# Patient Record
Sex: Female | Born: 1975
Health system: Southern US, Community
[De-identification: ages and names within clinical notes are randomized; demographics above are authoritative.]

## PROBLEM LIST (undated history)

## (undated) DIAGNOSIS — IMO0001 Reserved for inherently not codable concepts without codable children: Secondary | ICD-10-CM

## (undated) DIAGNOSIS — F32A Depression, unspecified: Secondary | ICD-10-CM

## (undated) DIAGNOSIS — N200 Calculus of kidney: Secondary | ICD-10-CM

## (undated) DIAGNOSIS — F329 Major depressive disorder, single episode, unspecified: Secondary | ICD-10-CM

## (undated) DIAGNOSIS — R896 Abnormal cytological findings in specimens from other organs, systems and tissues: Secondary | ICD-10-CM

## (undated) DIAGNOSIS — G43909 Migraine, unspecified, not intractable, without status migrainosus: Secondary | ICD-10-CM

## (undated) HISTORY — DX: Migraine, unspecified, not intractable, without status migrainosus: G43.909

## (undated) HISTORY — DX: Calculus of kidney: N20.0

## (undated) HISTORY — DX: Abnormal cytological findings in specimens from other organs, systems and tissues: R89.6

## (undated) HISTORY — PX: REDUCTION MAMMAPLASTY: SUR839

## (undated) HISTORY — DX: Major depressive disorder, single episode, unspecified: F32.9

## (undated) HISTORY — PX: APPENDECTOMY: SHX54

## (undated) HISTORY — DX: Reserved for inherently not codable concepts without codable children: IMO0001

## (undated) HISTORY — DX: Depression, unspecified: F32.A

---
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
## 1998-08-14 ENCOUNTER — Other Ambulatory Visit: Admission: RE | Admit: 1998-08-14 | Discharge: 1998-08-14 | Payer: Self-pay | Admitting: Obstetrics and Gynecology

## 1998-11-11 ENCOUNTER — Emergency Department (HOSPITAL_COMMUNITY): Admission: EM | Admit: 1998-11-11 | Discharge: 1998-11-12 | Payer: Self-pay | Admitting: *Deleted

## 1999-08-04 ENCOUNTER — Emergency Department (HOSPITAL_COMMUNITY): Admission: EM | Admit: 1999-08-04 | Discharge: 1999-08-04 | Payer: Self-pay | Admitting: Internal Medicine

## 1999-08-25 ENCOUNTER — Other Ambulatory Visit: Admission: RE | Admit: 1999-08-25 | Discharge: 1999-08-25 | Payer: Self-pay | Admitting: Obstetrics and Gynecology

## 1999-10-14 ENCOUNTER — Encounter: Payer: Self-pay | Admitting: Emergency Medicine

## 1999-10-14 ENCOUNTER — Emergency Department (HOSPITAL_COMMUNITY): Admission: EM | Admit: 1999-10-14 | Discharge: 1999-10-14 | Payer: Self-pay | Admitting: Emergency Medicine

## 1999-11-02 ENCOUNTER — Encounter: Payer: Self-pay | Admitting: Urology

## 1999-11-02 ENCOUNTER — Encounter: Admission: RE | Admit: 1999-11-02 | Discharge: 1999-11-02 | Payer: Self-pay | Admitting: Urology

## 1999-12-09 ENCOUNTER — Encounter: Payer: Self-pay | Admitting: Urology

## 1999-12-09 ENCOUNTER — Encounter: Admission: RE | Admit: 1999-12-09 | Discharge: 1999-12-09 | Payer: Self-pay | Admitting: Urology

## 2000-03-21 ENCOUNTER — Emergency Department (HOSPITAL_COMMUNITY): Admission: EM | Admit: 2000-03-21 | Discharge: 2000-03-21 | Payer: Self-pay | Admitting: Emergency Medicine

## 2000-03-21 ENCOUNTER — Encounter: Payer: Self-pay | Admitting: Emergency Medicine

## 2000-05-04 ENCOUNTER — Ambulatory Visit (HOSPITAL_COMMUNITY): Admission: RE | Admit: 2000-05-04 | Discharge: 2000-05-04 | Payer: Self-pay | Admitting: Internal Medicine

## 2000-05-04 ENCOUNTER — Encounter: Payer: Self-pay | Admitting: Internal Medicine

## 2000-06-09 ENCOUNTER — Emergency Department (HOSPITAL_COMMUNITY): Admission: EM | Admit: 2000-06-09 | Discharge: 2000-06-09 | Payer: Self-pay | Admitting: *Deleted

## 2000-06-09 ENCOUNTER — Encounter: Payer: Self-pay | Admitting: *Deleted

## 2000-08-15 ENCOUNTER — Other Ambulatory Visit: Admission: RE | Admit: 2000-08-15 | Discharge: 2000-08-15 | Payer: Self-pay | Admitting: Obstetrics and Gynecology

## 2000-09-22 ENCOUNTER — Emergency Department (HOSPITAL_COMMUNITY): Admission: EM | Admit: 2000-09-22 | Discharge: 2000-09-22 | Payer: Self-pay | Admitting: Emergency Medicine

## 2000-11-08 ENCOUNTER — Encounter: Payer: Self-pay | Admitting: Emergency Medicine

## 2000-11-08 ENCOUNTER — Emergency Department (HOSPITAL_COMMUNITY): Admission: EM | Admit: 2000-11-08 | Discharge: 2000-11-08 | Payer: Self-pay | Admitting: Emergency Medicine

## 2000-11-09 ENCOUNTER — Emergency Department (HOSPITAL_COMMUNITY): Admission: EM | Admit: 2000-11-09 | Discharge: 2000-11-09 | Payer: Self-pay | Admitting: Emergency Medicine

## 2000-11-09 ENCOUNTER — Encounter: Payer: Self-pay | Admitting: *Deleted

## 2001-01-24 HISTORY — PX: CHOLECYSTECTOMY: SHX55

## 2001-02-06 ENCOUNTER — Emergency Department (HOSPITAL_COMMUNITY): Admission: EM | Admit: 2001-02-06 | Discharge: 2001-02-06 | Payer: Self-pay | Admitting: Emergency Medicine

## 2001-05-14 ENCOUNTER — Ambulatory Visit (HOSPITAL_COMMUNITY): Admission: RE | Admit: 2001-05-14 | Discharge: 2001-05-14 | Payer: Self-pay | Admitting: Internal Medicine

## 2001-05-14 ENCOUNTER — Encounter: Payer: Self-pay | Admitting: Internal Medicine

## 2001-05-22 ENCOUNTER — Encounter (INDEPENDENT_AMBULATORY_CARE_PROVIDER_SITE_OTHER): Payer: Self-pay | Admitting: Specialist

## 2001-05-22 ENCOUNTER — Ambulatory Visit (HOSPITAL_COMMUNITY): Admission: RE | Admit: 2001-05-22 | Discharge: 2001-05-23 | Payer: Self-pay | Admitting: *Deleted

## 2001-11-19 ENCOUNTER — Other Ambulatory Visit: Admission: RE | Admit: 2001-11-19 | Discharge: 2001-11-19 | Payer: Self-pay | Admitting: Obstetrics and Gynecology

## 2002-01-24 HISTORY — PX: BREAST SURGERY: SHX581

## 2003-01-10 ENCOUNTER — Inpatient Hospital Stay (HOSPITAL_COMMUNITY): Admission: EM | Admit: 2003-01-10 | Discharge: 2003-01-12 | Payer: Self-pay | Admitting: Emergency Medicine

## 2003-01-10 ENCOUNTER — Encounter (INDEPENDENT_AMBULATORY_CARE_PROVIDER_SITE_OTHER): Payer: Self-pay | Admitting: Specialist

## 2003-02-24 ENCOUNTER — Other Ambulatory Visit: Admission: RE | Admit: 2003-02-24 | Discharge: 2003-02-24 | Payer: Self-pay | Admitting: Obstetrics and Gynecology

## 2003-05-05 ENCOUNTER — Emergency Department (HOSPITAL_COMMUNITY): Admission: EM | Admit: 2003-05-05 | Discharge: 2003-05-05 | Payer: Self-pay | Admitting: Emergency Medicine

## 2004-01-10 ENCOUNTER — Emergency Department (HOSPITAL_COMMUNITY): Admission: EM | Admit: 2004-01-10 | Discharge: 2004-01-10 | Payer: Self-pay | Admitting: Family Medicine

## 2004-03-30 ENCOUNTER — Other Ambulatory Visit: Admission: RE | Admit: 2004-03-30 | Discharge: 2004-03-30 | Payer: Self-pay | Admitting: Obstetrics and Gynecology

## 2004-06-27 ENCOUNTER — Emergency Department (HOSPITAL_COMMUNITY): Admission: EM | Admit: 2004-06-27 | Discharge: 2004-06-27 | Payer: Self-pay | Admitting: Family Medicine

## 2004-06-27 ENCOUNTER — Ambulatory Visit (HOSPITAL_COMMUNITY): Admission: RE | Admit: 2004-06-27 | Discharge: 2004-06-27 | Payer: Self-pay | Admitting: Emergency Medicine

## 2004-06-28 ENCOUNTER — Emergency Department (HOSPITAL_COMMUNITY): Admission: EM | Admit: 2004-06-28 | Discharge: 2004-06-28 | Payer: Self-pay | Admitting: Emergency Medicine

## 2005-02-21 ENCOUNTER — Emergency Department (HOSPITAL_COMMUNITY): Admission: EM | Admit: 2005-02-21 | Discharge: 2005-02-22 | Payer: Self-pay | Admitting: Emergency Medicine

## 2005-02-21 ENCOUNTER — Ambulatory Visit (HOSPITAL_COMMUNITY): Admission: RE | Admit: 2005-02-21 | Discharge: 2005-02-21 | Payer: Self-pay | Admitting: Physician Assistant

## 2005-05-10 ENCOUNTER — Other Ambulatory Visit: Admission: RE | Admit: 2005-05-10 | Discharge: 2005-05-10 | Payer: Self-pay | Admitting: Obstetrics and Gynecology

## 2005-08-18 ENCOUNTER — Emergency Department (HOSPITAL_COMMUNITY): Admission: EM | Admit: 2005-08-18 | Discharge: 2005-08-18 | Payer: Self-pay | Admitting: Family Medicine

## 2005-08-23 ENCOUNTER — Emergency Department (HOSPITAL_COMMUNITY): Admission: EM | Admit: 2005-08-23 | Discharge: 2005-08-23 | Payer: Self-pay | Admitting: Emergency Medicine

## 2005-12-11 ENCOUNTER — Emergency Department (HOSPITAL_COMMUNITY): Admission: EM | Admit: 2005-12-11 | Discharge: 2005-12-11 | Payer: Self-pay | Admitting: Emergency Medicine

## 2006-05-15 ENCOUNTER — Other Ambulatory Visit: Admission: RE | Admit: 2006-05-15 | Discharge: 2006-05-15 | Payer: Self-pay | Admitting: Obstetrics and Gynecology

## 2006-09-18 ENCOUNTER — Other Ambulatory Visit: Admission: RE | Admit: 2006-09-18 | Discharge: 2006-09-18 | Payer: Self-pay | Admitting: Obstetrics and Gynecology

## 2007-02-15 ENCOUNTER — Encounter
Admission: RE | Admit: 2007-02-15 | Discharge: 2007-05-16 | Payer: Self-pay | Admitting: Physical Medicine & Rehabilitation

## 2007-02-15 ENCOUNTER — Ambulatory Visit: Payer: Self-pay | Admitting: Physical Medicine & Rehabilitation

## 2007-02-22 ENCOUNTER — Emergency Department (HOSPITAL_COMMUNITY): Admission: EM | Admit: 2007-02-22 | Discharge: 2007-02-22 | Payer: Self-pay | Admitting: Emergency Medicine

## 2007-03-19 ENCOUNTER — Ambulatory Visit: Payer: Self-pay | Admitting: Physical Medicine & Rehabilitation

## 2007-03-27 ENCOUNTER — Encounter
Admission: RE | Admit: 2007-03-27 | Discharge: 2007-04-03 | Payer: Self-pay | Admitting: Physical Medicine & Rehabilitation

## 2007-03-27 ENCOUNTER — Ambulatory Visit: Payer: Self-pay | Admitting: Physical Medicine & Rehabilitation

## 2007-04-03 ENCOUNTER — Ambulatory Visit: Payer: Self-pay | Admitting: Physical Medicine & Rehabilitation

## 2007-05-22 ENCOUNTER — Other Ambulatory Visit: Admission: RE | Admit: 2007-05-22 | Discharge: 2007-05-22 | Payer: Self-pay | Admitting: Obstetrics and Gynecology

## 2007-06-22 ENCOUNTER — Emergency Department (HOSPITAL_COMMUNITY): Admission: EM | Admit: 2007-06-22 | Discharge: 2007-06-22 | Payer: Self-pay | Admitting: Emergency Medicine

## 2007-10-04 ENCOUNTER — Emergency Department (HOSPITAL_COMMUNITY): Admission: EM | Admit: 2007-10-04 | Discharge: 2007-10-04 | Payer: Self-pay | Admitting: Family Medicine

## 2007-11-16 ENCOUNTER — Emergency Department (HOSPITAL_COMMUNITY): Admission: EM | Admit: 2007-11-16 | Discharge: 2007-11-16 | Payer: Self-pay | Admitting: Emergency Medicine

## 2008-01-01 ENCOUNTER — Ambulatory Visit (HOSPITAL_COMMUNITY): Admission: RE | Admit: 2008-01-01 | Discharge: 2008-01-01 | Payer: Self-pay | Admitting: Sports Medicine

## 2008-02-07 ENCOUNTER — Ambulatory Visit: Payer: Self-pay | Admitting: Gynecology

## 2008-02-20 ENCOUNTER — Encounter: Admission: RE | Admit: 2008-02-20 | Discharge: 2008-02-20 | Payer: Self-pay | Admitting: Orthopedic Surgery

## 2008-02-21 ENCOUNTER — Ambulatory Visit: Payer: Self-pay | Admitting: Obstetrics and Gynecology

## 2008-02-28 ENCOUNTER — Encounter: Admission: RE | Admit: 2008-02-28 | Discharge: 2008-02-28 | Payer: Self-pay | Admitting: Obstetrics and Gynecology

## 2008-05-28 ENCOUNTER — Ambulatory Visit: Payer: Self-pay | Admitting: Obstetrics and Gynecology

## 2008-05-28 ENCOUNTER — Encounter: Payer: Self-pay | Admitting: Obstetrics and Gynecology

## 2008-05-28 ENCOUNTER — Other Ambulatory Visit: Admission: RE | Admit: 2008-05-28 | Discharge: 2008-05-28 | Payer: Self-pay | Admitting: Obstetrics and Gynecology

## 2009-06-10 ENCOUNTER — Other Ambulatory Visit: Admission: RE | Admit: 2009-06-10 | Discharge: 2009-06-10 | Payer: Self-pay | Admitting: Obstetrics and Gynecology

## 2009-06-10 ENCOUNTER — Ambulatory Visit: Payer: Self-pay | Admitting: Obstetrics and Gynecology

## 2009-09-07 IMAGING — MG MM DIAGNOSTIC BILATERAL
5 series · 5 of 5 positions shown · non-contrast
Comparison: None

CLINICAL DATA: The patient has felt a small lump in the 3 o'clock
position of the left breast for 5 weeks.  The patient underwent
reduction mammoplasty 5 years ago.

DIGITAL DIAGNOSTIC  BILATERAL  MAMMOGRAM  WITH CAD AND LEFT BREAST
ULTRASOUND:

[R CC]
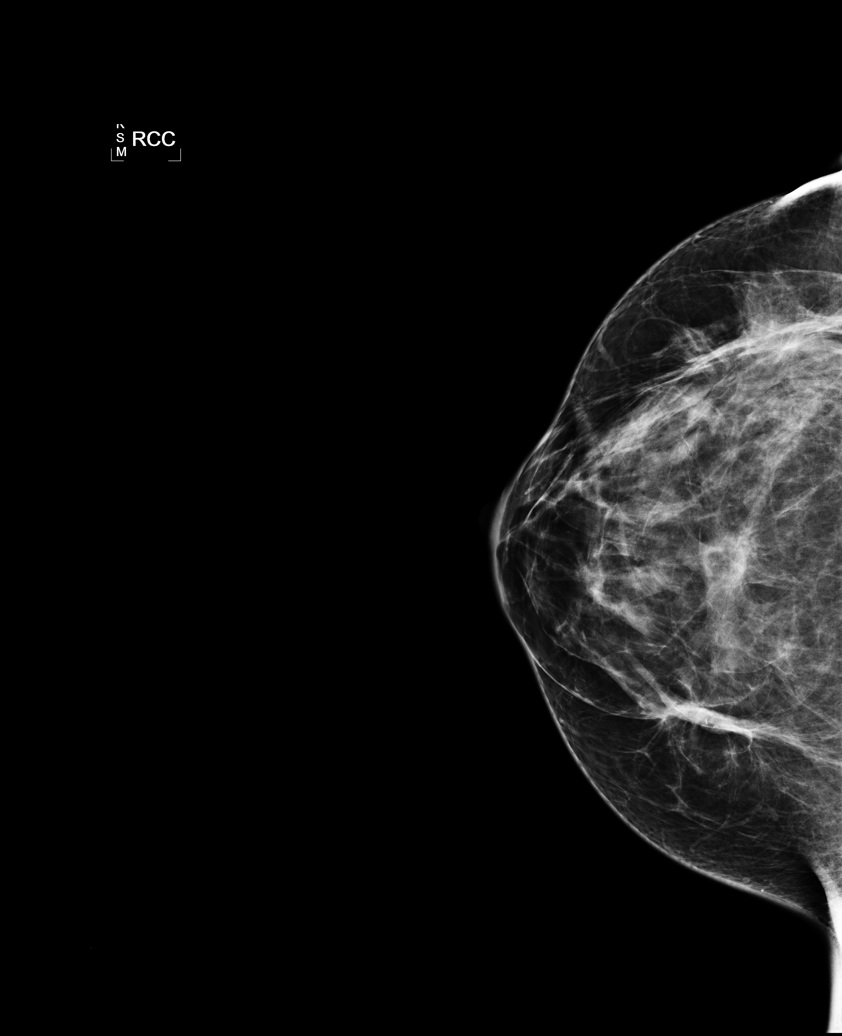

[L CC (1 of 2)]
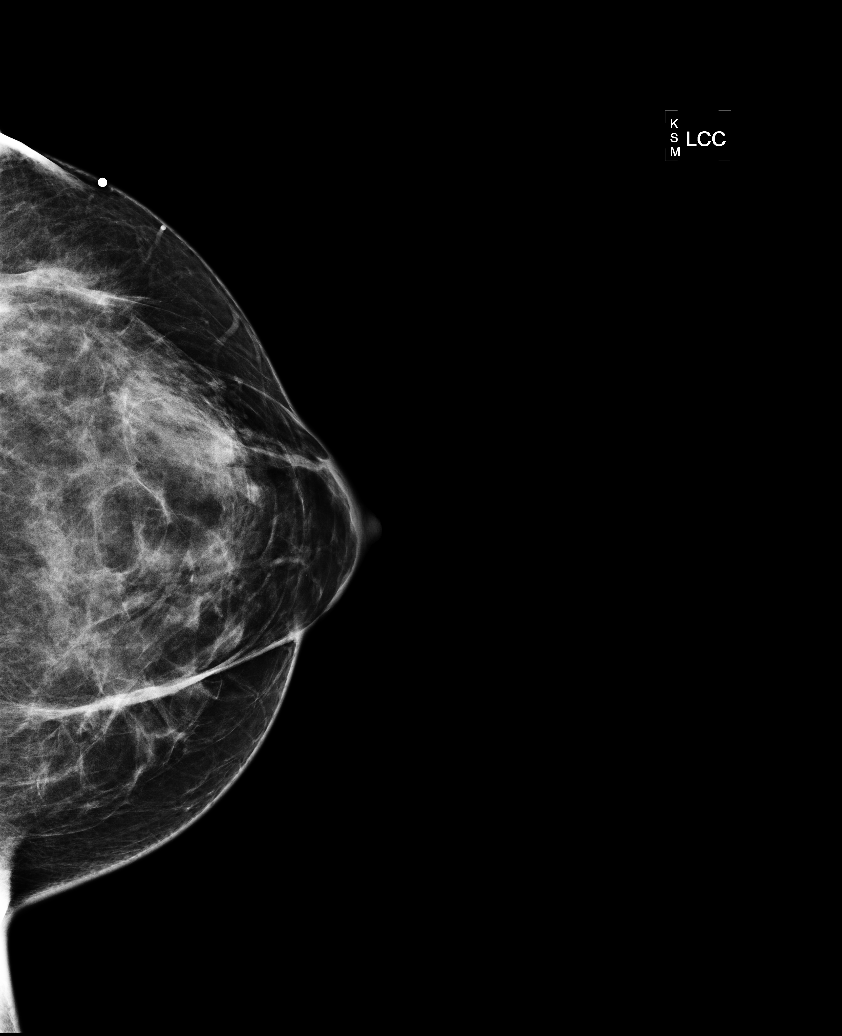

[L MLO]
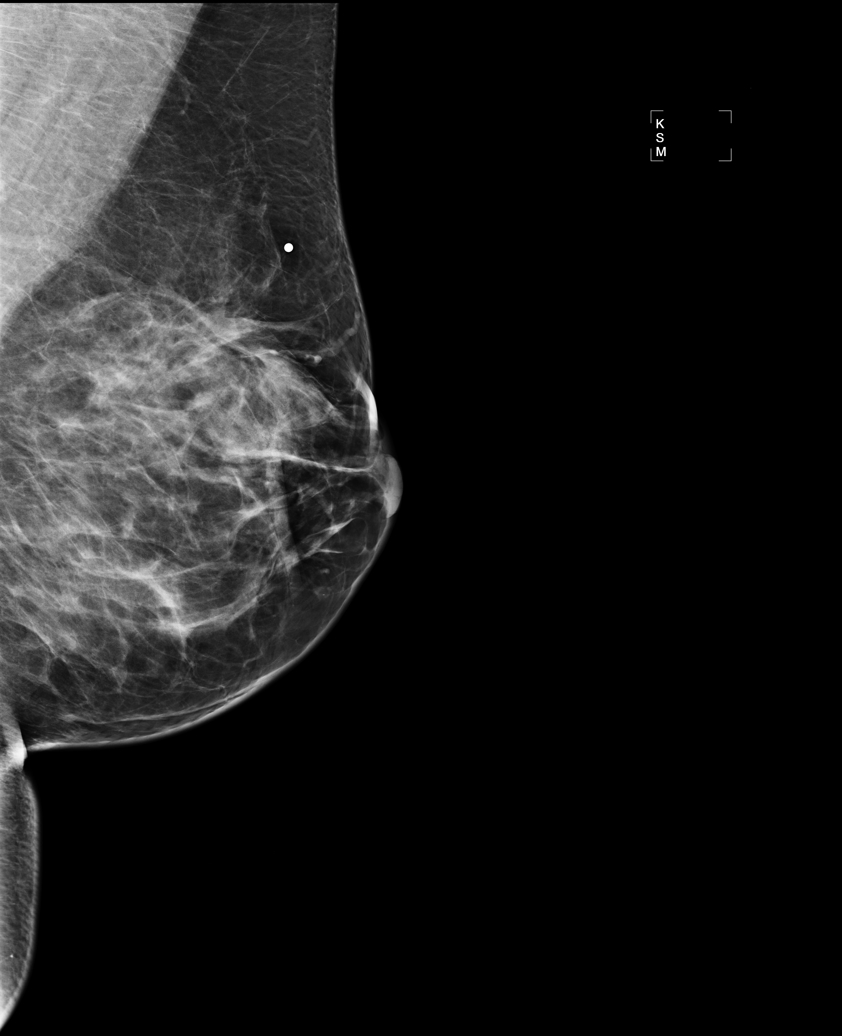

[R MLO]
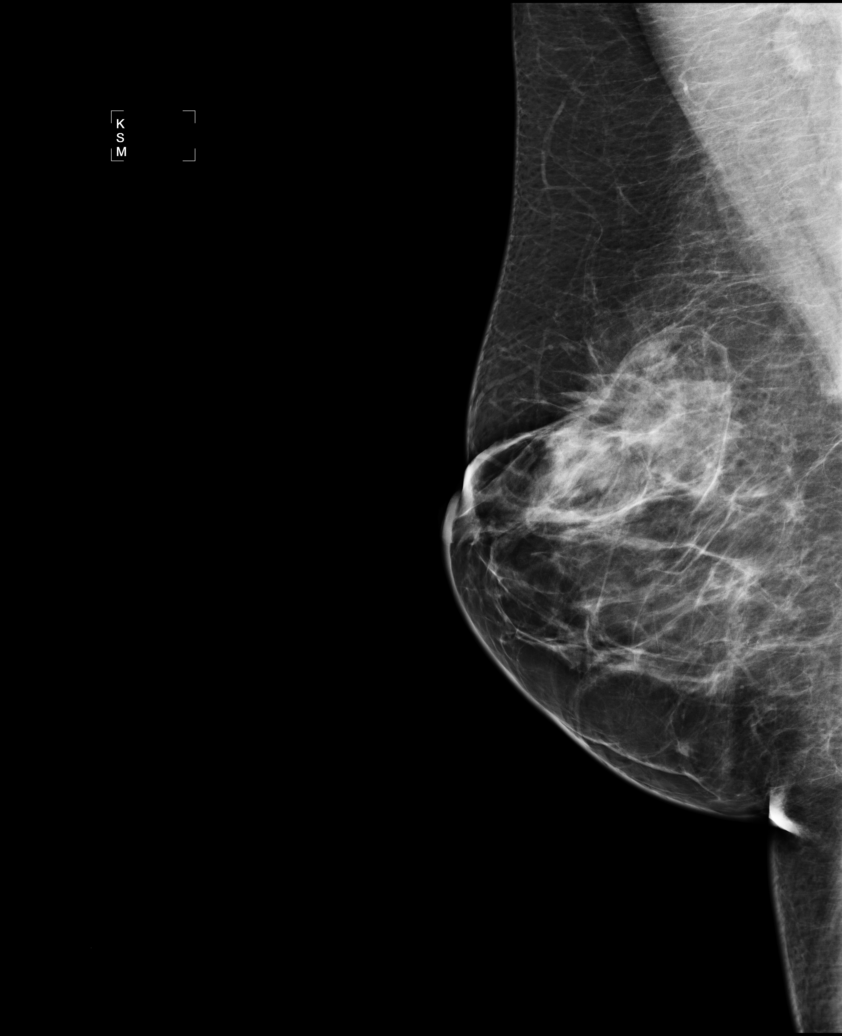

[L CC (2 of 2)]
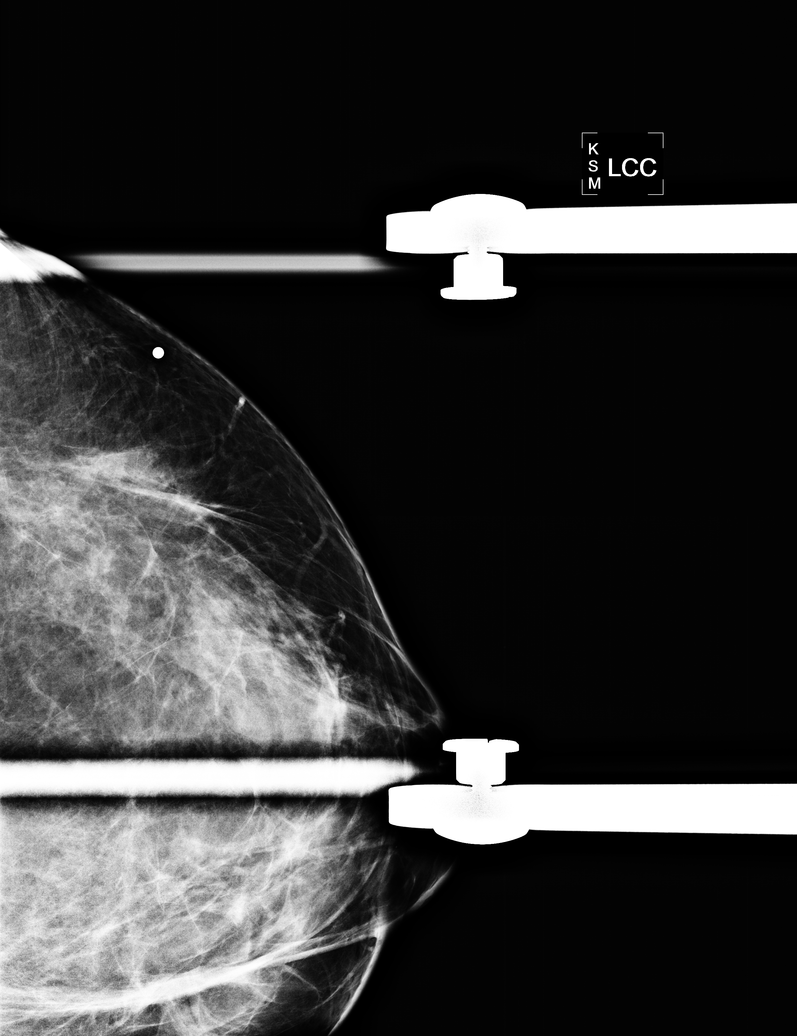

[5 of 5 positions shown; findings below may reference images not displayed]

FINDINGS: There are scattered fibroglandular densities.  Reduction
mammoplasty changes are noted bilaterally.  There is no dominant
mass, nonsurgical architectural distortion or calcification to
suggest malignancy.

On physical exam, no mass is palpated in the outer portion of the
left breast.

Ultrasound is performed, showing fatty tissue with no mass,
distortion or shadowing to suggest malignancy.
IMPRESSION: No mammographic or sonographic evidence of malignancy.  Yearly
screening mammography should begin at age 40 unless clinically
indicated earlier.

BI-RADS CATEGORY 1:  Negative.

REF:B1 DICTATED: 02/28/2008 [DATE]

## 2009-12-07 ENCOUNTER — Other Ambulatory Visit: Admission: RE | Admit: 2009-12-07 | Discharge: 2009-12-07 | Payer: Self-pay | Admitting: Obstetrics and Gynecology

## 2009-12-07 ENCOUNTER — Ambulatory Visit: Payer: Self-pay | Admitting: Obstetrics and Gynecology

## 2010-06-08 NOTE — Procedures (Signed)
NAMEIVIONNA, [name]            ACCOUNT NO.:  [id number]   MEDICAL RECORD NO.:  [id number]          PATIENT TYPE:  REC   LOCATION:  [facility]                          FACILITY:  [facility]   PHYSICIAN:  Erick Colace, M.D.DATE OF BIRTH:  07-Sep-1975   DATE OF PROCEDURE:  03/14/2007  DATE OF DISCHARGE:                               OPERATIVE REPORT   ACUPUNCTURE TREATMENT:  Last treatment performed March 06, 2007.   Needles placed bilaterally at LV3, LI4,  ST36,  E-Stim between LI4 and  ST36.  Treatment time 30 minutes.  The patient tolerated the procedure  well.  Consider addition of DU20 as well as some additional cervical  points.  I will see her back next week.  Will consider auricular points  as well.      Erick Colace, M.D.  Electronically Signed     AEK/MEDQ  D:  03/14/2007 12:24:33  T:  03/15/2007 08:10:26  Job:  [id number]

## 2010-06-08 NOTE — Procedures (Signed)
NAMEBREINDY, [name]            ACCOUNT NO.:  [id number]   MEDICAL RECORD NO.:  [id number]          PATIENT TYPE:  REC   LOCATION:  [facility]                          FACILITY:  [facility]   PHYSICIAN:  Erick Colace, M.D.DATE OF BIRTH:  1975/10/29   DATE OF PROCEDURE:  04/03/2007  DATE OF DISCHARGE:                               OPERATIVE REPORT   PROCEDURE:  Acupuncture treatment #5.   INDICATIONS FOR PROCEDURE:  Migraine headache with only partial response  to the medication and other conservative care.   DESCRIPTION OF PROCEDURE:  Needles placed bilateral at LV-3, LI-4, ST-  36, DU-20, right Shen-Men and right thalamus points, 4 Hz stimulation  between LI-4 and ST-36.  30-minute treatment time.   She may continue acupuncture on her own given the particulars of her  insurance policy, it may be better to pursue elsewhere.      Erick Colace, M.D.  Electronically Signed     AEK/MEDQ  D:  04/03/2007 15:15:37  T:  04/04/2007 09:44:59  Job:  [id number]

## 2010-06-08 NOTE — Group Therapy Note (Signed)
Consult requested by Dr. Meryl Crutch and [facility].   HISTORY OF PRESENT ILLNESS:  A 35 year old female with history of  migraine headaches dating back about 13 years.  She states that the pain  is in the facial region but also in the occipital region.  She has had  partial LEEP with triptans.  She has not had much relief in terms of  prophylactic agents.  She is currently on Cymbalta partially as a  prophylactic agent 60 mg a day.  Has tried Botox and this has not given  her any significant prophylactic effect.   CURRENT MEDICATIONS:  1. Imitrex as noted.  2. Cymbalta.  3. Ambien for sleep.  4. Ortho-Cyclen.   ALLERGIES:  Compazine causing agitation and Imipramine causing  agitation.   PAST MEDICAL HISTORY:  1. She has had a lap coli in 2006 and a laparoscopic appendectomy in      2007.  She has had imaging studies showing ovarian cyst as well as      nephrolithiasis without hydronephrosis.  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  She has had pain related to      ovarian cyst in 2007.  2. Functionally she is independent.  Works as an Scientist, forensic full time.      She has 3 migraines week but these are not disabling type      migraines.  3. Her pain when she gets her migraines is a 5-7/10 interferes      moderately with her usual activities.  Her worse headaches occur      about 4 a.m.  Her pain improves with ice as well as her      medications.   REVIEW OF SYSTEMS:  Positive for depression.   SOCIAL HISTORY:  Single.  Lives with her fiance. Nonsmoker.  Nondrinker.   FAMILY HISTORY:  High blood pressure.  Psychiatric problems.   Her preference is flavor sweet, season fall and color blue.   PHYSICAL EXAMINATION:  VITALS:  Her blood pressure is 134/84, pulse 66,  respirations 18, O2 sat 100% on room air.  GENERAL:  No acute distress.  Mood and affect is appropriate.  NECK: Has full range of motion. Cranial nerves II-XII are intact.  She  has  no tenderness to palpation over sinuses or over occipital or neck  area.  No pain in the upper traps but is a bit tight in the upper traps.  EXTREMITIES:  She has 5/5 strength bilateral deltoid, biceps, triceps,  grip as well as hip flexor, knee extensor and ankle dorsiflexor.  Her  gait is normal.  No evidence of toe drag or knee instability. Her  sensation is normal C6, 7, 8, T1 as well as L3, 4, 5, S1 dermatomes.  Deep tendon reflexes normal bilateral biceps, triceps, brachial  radialis, patellar and Achilles.  Range of motion is normal in the upper  and lower extremities.  Fine range of motion in the lumbar area and  thoracic area normal, neck area normal.  Coordination is normal.  Extremities without edema.   IMPRESSION:  Migraine headaches.  No neurological signs, no sign of  cervicogenic headache.   PLAN:  Discussed acupuncture as a treatment option and explained the  need for a weekly trial x 4-6 treatments.  Will do an abbreviated  treatment today.  Should she have good response, may need to do monthly  treatments as prophylactic against migraines.  Discussed with the patient, she agrees with the treatment plan.   ADDENDUM:  Needle placed bilateral LV 3, LI 4, and ST 36, E-stim between  LF 4, ST 36, treatment time 20 minutes for her stem.  The patient  tolerated procedure well.  She will be back in one week for repeat.      Erick Colace, M.D.  Electronically Signed     AEK/MedQ  D:  02/16/2007 17:11:12  T:  02/17/2007 07:36:04  Job #:  [id number]

## 2010-06-08 NOTE — Assessment & Plan Note (Signed)
Acupuncture treatment #1.  She was seen by me in consultation February 16, 2007.  Needles were placed bilaterally at LV as in the liver, 3LI as  in large intestine, 4ST as in stomach, 36.  E-stimulation between LI4  and ST6.  Treatment time 30 minutes.  The patient tolerated the  procedure well.  Post procedure instructions were given.      Erick Colace, M.D.  Electronically Signed     AEK/MedQ  D:  03/07/2007 12:42:17  T:  03/08/2007 15:10:22  Job #:  [id number]

## 2010-06-08 NOTE — Procedures (Signed)
NAMENORALYN, [name]            ACCOUNT NO.:  [id number]   MEDICAL RECORD NO.:  [id number]           PATIENT TYPE:   LOCATION:                                 FACILITY:   PHYSICIAN:  Erick Colace, M.D.DATE OF BIRTH:  1975-10-09   DATE OF PROCEDURE:  DATE OF DISCHARGE:                               OPERATIVE REPORT   PROCEDURE:  Acupuncture treatment number 4.   INDICATION FOR PROCEDURE:  Migraine headaches, only partial response to  medication management and other conservative care.   Needles placed bilaterally at LB3, LI4, ST36, as well as DU20 and left  Shen-men and left thalamus points on the regular points.  Four Hz  electrical stimulation between LI4 and ST36.   The patient tolerated the procedure well.  Thirty minute treatment time.      Erick Colace, M.D.  Electronically Signed     AEK/MEDQ  D:  03/20/2007 15:46:17  T:  03/21/2007 09:57:47  Job:  [id number]

## 2010-06-11 NOTE — H&P (Signed)
NAME:  [name]                      ACCOUNT NO.:  [id number]   MEDICAL RECORD NO.:  [id number]                   PATIENT TYPE:  INP   LOCATION:  5735                                 FACILITY:  [facility]   PHYSICIAN:  Velora Heckler, M.D.                DATE OF BIRTH:  03-05-1975   DATE OF ADMISSION:  01/09/2003  DATE OF DISCHARGE:                                HISTORY & PHYSICAL   REFERRING PHYSICIAN:  Celene Kras, M.D., Emergency Department   REASON FOR ADMISSION:  Acute appendicitis.   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  The patient had a laparoscopic cholecystectomy  in May, 2003 by Dr. Danna Hefty.  Since that time, she has had  intermittent abdominal pain; however, on December 16, following an afternoon  meal, patient developed lower abdominal pain on the right side.  This  persisted and was associated with nausea and vomiting.  The pain became more  severe.  She presented to the emergency department for evaluation.   Laboratory studies were performed, which were within normal limits; however,  physical examination showed tenderness in the right lower quadrant, which  was worrisome.  The patient was prepped and taken to radiology for CT scan  of the abdomen and pelvis, which showed findings consistent with acute  appendicitis with a retrocecal appendix.  General Surgery was consulted, and  the patient was seen and prepared for the operating room.   PAST MEDICAL HISTORY:  1. Status post laparoscopic cholecystectomy in May, 2003.  2. Status post breast reduction in 2004 by Dr. Delia Chimes.  3. Status post ankle surgery in 1995 for recurrent sprains.   MEDICATIONS:  1. Paxil.  2. Imitrex for migraines.   ALLERGIES:  SULFA causing nausea and vomiting and rash with LATEX gloves.   FAMILY HISTORY:   Noncontributory.   SOCIAL HISTORY:  The patient is an Engineer, agricultural at [employer].  She is accompanied by a friend.  She does not smoke.  She does not  drink alcohol.   REVIEW OF SYSTEMS:  A 15-system review without significant other positive  except as noted above.   PHYSICAL EXAMINATION:  GENERAL:  A 35 year old well-developed and well-  nourished white female in mild discomfort on a stretcher in the emergency  department.  VITAL SIGNS:  Temperature 98.1, blood pressure 116/66, pulse 95,  respirations 16.  O2 saturation 98% on room air.  HEENT:  Normocephalic.  Sclerae are clear.  Conjunctivae are clear.  Pupils  are equal and reactive.  Dentition is good.  Voice is normal.  NECK:  Palpation of the neck shows a slight fullness in the thyroid bed but  without nodularity.  There is no anterior or posterior cervical adenopathy.  There are no supraclavicular masses.  LUNGS:  Clear to auscultation bilaterally.  CARDIAC:  Regular rate and rhythm without murmur.  ABDOMEN:  Soft.  There are a few bowel sounds present.  There are well-  healed surgical wounds, consistent with previous cholecystectomy.  There is  tenderness to palpation and percussion in the right lower extremity.  There  is voluntary guarding.  There is mild rebound tenderness in the right lower  quadrant.  EXTREMITIES:  Nontender without edema.  NEUROLOGIC:  The patient is alert and oriented without focal neurologic  deficit.   LABORATORY DATA:  White count 7.1, hemoglobin 13.0, platelet count 299,000.  Differential shows 64% neutrophils, 24% lymphocytes.  Chemistry profile is  within normal limits.  Liver function tests are within normal limits.  Lipase is normal.  Urinalysis is benign.   CT scan of the abdomen and pelvis shows an 8.5 mm retrocecal appendix,  worrisome for early acute appendicitis.   IMPRESSION:  Probable early acute appendicitis with likely retrocecal  appendix.   DISCUSSION:  I  discussed at length with Ms. Sexton the indications for  surgery.  I explained laparoscopic appendectomy, which she understands.  I  have discussed the possibility of conversion to an open procedure due to the  retrocecal appendix.  She has been administered a dose of intravenous  Unasyn.  We will take her immediately to the operating room for laparoscopic  appendectomy.                                                Velora Heckler, M.D.    TMG/MEDQ  D:  01/10/2003  T:  01/10/2003  Job:  [id number]

## 2010-06-11 NOTE — Op Note (Signed)
NAME:  [name], [name]                      ACCOUNT NO.:  [id number]   MEDICAL RECORD NO.:  [id number]                   PATIENT TYPE:  INP   LOCATION:  1833                                 FACILITY:  [facility]   PHYSICIAN:  Velora Heckler, M.D.                DATE OF BIRTH:  1975/06/23   DATE OF PROCEDURE:  01/10/2003  DATE OF DISCHARGE:                                 OPERATIVE REPORT   PREOPERATIVE DIAGNOSIS:  Acute appendicitis.   POSTOPERATIVE DIAGNOSIS:  Acute appendicitis.   PROCEDURE:  Laparoscopic appendectomy.   SURGEON:  Velora Heckler, M.D.   ANESTHESIA:  General.   ESTIMATED BLOOD LOSS:  Minimal.   PREPARATION:  Betadine.   COMPLICATIONS:  None.   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 Her physical examination was felt to be consistent with acute  appendicitis. Laboratory studies were normal. A CT scan of the abdomen and  pelvis showed findings consistent with acute appendicitis. The patient is  brought to the operating room for appendectomy.   DESCRIPTION OF PROCEDURE:  The procedure was done in operating room #17 at  the [facility]. [facility]. The patient was brought to the  operating room and placed in the supine position on the operating table.  Following administration of general anesthesia the patient was prepped and  draped in the usual strict, aseptic fashion.   After ascertaining that an adequate level of anesthesia had been obtained, a  supraumbical  incision was made in the midline with a ##15 blade. Dissection  was carried down through the subcutaneous tissues. The fascia was incised in  the midline. The peritoneal cavity was entered cautiously. A 0 Vicryl  pursestring suture was placed in the fascia. A Hasson cannula was introduced  under direct  vision and secured with the pursestring suture.  The abdomen was insufflated with CO2. The laparoscope was introduced  and  the abdomen explored.   The appendix is visible in the right lower quadrant. It is  not retrocecal.  The tip of the appendix appears injected, larger than normal caliber and  likely  early appendicitis. There is no sign of perforation or abscess.  There is no fluid within the abdomen. Inspection of the bowel appears  normal. The colon appears normal. The liver appears normal. There are no  adhesions to the anterior abdominal wall.   Operative ports are placed in the right upper quadrant and left lower  quadrant. The appendix is elevated. It is slightly  adhered to the terminal  ileum. This was carefully  separated using the harmonic scalpel to divide  peritoneal reflections. The appendix was quite long. It is dissected out up  to the level of the cecum. The base of the appendix is transected  with an  Endo GIA stapler using a vascular cartridge. The remaining appendiceal  mesentery is divided with a harmonic scalpel with good hemostasis noted.  The appendix was placed in an EndoCatch bag and withdrawn through the left  lower quadrant port without difficulty. It is submitted to pathology for  review.   The right lower quadrant is inspected. Good hemostasis is noted. Ports were  removed under direct vision. Good hemostasis is noted at the port sites.  Pneumoperitoneum is released. The Hasson cannula is removed and the 0 Vicryl  pursestring suture tied securely. All 3 wounds were anesthetized with local  anesthetic with epinephrine. All 3 wounds were closed with interrupted 4-0  Vicryl subcuticular sutures. The wounds were washed and dried and Benzoin  and Steri-Strips are applied. Sterile gauze dressings are applied.   The patient was awakened from anesthesia and brought to the recovery room in  stable condition. The patient tolerated the procedure well.                                                Velora Heckler, M.D.    TMG/MEDQ  D:  01/10/2003  T:  01/11/2003  Job:  [id number]

## 2010-06-22 ENCOUNTER — Encounter: Payer: Self-pay | Admitting: Obstetrics and Gynecology

## 2010-07-13 ENCOUNTER — Other Ambulatory Visit: Payer: Self-pay | Admitting: Obstetrics and Gynecology

## 2010-07-13 ENCOUNTER — Encounter (INDEPENDENT_AMBULATORY_CARE_PROVIDER_SITE_OTHER): Payer: 59 | Admitting: Obstetrics and Gynecology

## 2010-07-13 ENCOUNTER — Other Ambulatory Visit (HOSPITAL_COMMUNITY)
Admission: RE | Admit: 2010-07-13 | Discharge: 2010-07-13 | Disposition: A | Discharge status: Home / Self Care | Payer: 59 | Source: Ambulatory Visit | Attending: Obstetrics and Gynecology | Admitting: Obstetrics and Gynecology

## 2010-07-13 DIAGNOSIS — R87619 Unspecified abnormal cytological findings in specimens from cervix uteri: Secondary | ICD-10-CM | POA: Insufficient documentation

## 2010-07-13 DIAGNOSIS — Z13220 Encounter for screening for lipoid disorders: Secondary | ICD-10-CM

## 2010-07-13 DIAGNOSIS — Z01419 Encounter for gynecological examination (general) (routine) without abnormal findings: Secondary | ICD-10-CM

## 2010-08-11 ENCOUNTER — Ambulatory Visit (HOSPITAL_COMMUNITY)
Admission: RE | Admit: 2010-08-11 | Discharge: 2010-08-11 | Disposition: A | Discharge status: Home / Self Care | Payer: 59 | Attending: Psychiatry | Admitting: Psychiatry

## 2010-08-11 DIAGNOSIS — F339 Major depressive disorder, recurrent, unspecified: Secondary | ICD-10-CM | POA: Insufficient documentation

## 2010-08-13 ENCOUNTER — Ambulatory Visit (HOSPITAL_COMMUNITY): Payer: 59 | Admitting: Psychology

## 2010-08-13 DIAGNOSIS — F332 Major depressive disorder, recurrent severe without psychotic features: Secondary | ICD-10-CM

## 2010-08-20 ENCOUNTER — Encounter (HOSPITAL_COMMUNITY): Payer: 59 | Admitting: Psychology

## 2010-08-20 DIAGNOSIS — F332 Major depressive disorder, recurrent severe without psychotic features: Secondary | ICD-10-CM

## 2010-08-27 ENCOUNTER — Encounter (HOSPITAL_COMMUNITY): Payer: 59 | Admitting: Psychology

## 2010-08-27 DIAGNOSIS — F332 Major depressive disorder, recurrent severe without psychotic features: Secondary | ICD-10-CM

## 2010-09-10 ENCOUNTER — Encounter (HOSPITAL_COMMUNITY): Payer: 59 | Admitting: Psychology

## 2010-09-10 DIAGNOSIS — F332 Major depressive disorder, recurrent severe without psychotic features: Secondary | ICD-10-CM

## 2010-09-15 ENCOUNTER — Ambulatory Visit (HOSPITAL_COMMUNITY): Payer: 59 | Admitting: Psychiatry

## 2010-09-15 DIAGNOSIS — F331 Major depressive disorder, recurrent, moderate: Secondary | ICD-10-CM
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
## 2010-09-22 ENCOUNTER — Ambulatory Visit (HOSPITAL_COMMUNITY): Payer: 59 | Admitting: Psychiatry

## 2010-09-24 ENCOUNTER — Encounter (HOSPITAL_COMMUNITY): Payer: 59 | Admitting: Psychology

## 2010-09-24 DIAGNOSIS — F331 Major depressive disorder, recurrent, moderate: Secondary | ICD-10-CM

## 2010-10-12 ENCOUNTER — Encounter (HOSPITAL_COMMUNITY): Payer: 59 | Admitting: Psychology

## 2010-10-12 DIAGNOSIS — F330 Major depressive disorder, recurrent, mild: Secondary | ICD-10-CM

## 2010-10-14 LAB — HEPATIC FUNCTION PANEL
AST: 27
Alkaline Phosphatase: 52
Bilirubin, Direct: 0.1
Total Bilirubin: 0.6

## 2010-10-14 LAB — CBC
HCT: 34.6 — ABNORMAL LOW
Hemoglobin: 12.1
MCV: 90.2
Platelets: 231
RBC: 3.84 — ABNORMAL LOW
RDW: 12.7
WBC: 5.5

## 2010-10-14 LAB — I-STAT 8, (EC8 V) (CONVERTED LAB)
BUN: 11
Bicarbonate: 27.1 — ABNORMAL HIGH
HCT: 36
Operator id: 198171
pCO2, Ven: 48
pH, Ven: 7.36 — ABNORMAL HIGH

## 2010-10-14 LAB — DIFFERENTIAL
Eosinophils Absolute: 0.1
Lymphocytes Relative: 25
Neutro Abs: 3.5
Neutrophils Relative %: 63

## 2010-10-14 LAB — POCT I-STAT CREATININE
Creatinine, Ser: 0.9
Operator id: 198171

## 2010-10-14 LAB — LIPASE, BLOOD: Lipase: 25

## 2010-10-15 ENCOUNTER — Encounter (HOSPITAL_COMMUNITY): Payer: 59 | Admitting: Psychiatry

## 2010-10-15 DIAGNOSIS — F331 Major depressive disorder, recurrent, moderate: Secondary | ICD-10-CM

## 2010-10-27 LAB — URINE CULTURE: Colony Count: >=100000

## 2010-10-27 LAB — POCT URINALYSIS DIP (DEVICE)
Bilirubin Urine: NEGATIVE
Glucose, UA: NEGATIVE
Ketones, ur: NEGATIVE
Nitrite: POSITIVE — AB
pH: 6

## 2010-11-09 ENCOUNTER — Encounter (HOSPITAL_COMMUNITY): Payer: 59 | Admitting: Psychology

## 2010-11-19 ENCOUNTER — Encounter (HOSPITAL_COMMUNITY): Payer: 59 | Admitting: Psychiatry

## 2010-11-19 DIAGNOSIS — F331 Major depressive disorder, recurrent, moderate: Secondary | ICD-10-CM

## 2010-12-01 ENCOUNTER — Encounter (HOSPITAL_COMMUNITY): Payer: Self-pay | Admitting: *Deleted

## 2010-12-02 NOTE — Progress Notes (Signed)
Office visit 11/19/10 with Dr. Lolly Mustache. Cymbalta prescribed as 30mg  tablet, take three per day, for total daily dose of 90mg . Insurance requested prior authorization for this dosage thru pharmacy. Authorization applied for and was received on 12/01/10 from [facility]. Notified Pharmacy on 12/01/10.

## 2010-12-10 ENCOUNTER — Encounter (HOSPITAL_COMMUNITY): Payer: 59 | Admitting: Psychology

## 2010-12-14 ENCOUNTER — Ambulatory Visit (HOSPITAL_COMMUNITY): Payer: 59 | Admitting: Psychology

## 2010-12-14 ENCOUNTER — Encounter (HOSPITAL_COMMUNITY): Payer: Self-pay | Admitting: Psychology

## 2010-12-14 DIAGNOSIS — F331 Major depressive disorder, recurrent, moderate: Secondary | ICD-10-CM

## 2010-12-14 NOTE — Progress Notes (Signed)
   THERAPIST PROGRESS NOTE  Session Time: 1030-1130 Participation Level: Active  Behavioral Response: Well GroomedAlertDepressed  Type of Therapy: Individual Therapy  Treatment Goals addressed: Coping and Diagnosis: triggers, and early signs  Interventions: CBT, Strength-based, Supportive and Reframing  Summary: [name] is a 35 y.o. female who presents with significantly improved recurrent depression.  She credits medication changes:  Cymbalta 90 mg. And Trazedone 100 mg for the improvement and says that she is not completely free of depressive days or moments, but she is greatly improved, sleeping well at night, and not ruminating about things of the past. We discussed the coming holiday season and the stressors that can be predicted and how to cope with these.  We reviewed the supports that she has both with family and friends.  We talked about the thoughts that came up when she was most depressed that have now receded and how these can be another warning sign for her in the future.  She has a new routine for her exercise that involves a group at the gym, her tennis team, and running on her own.  She even has been able to share about her depression with some of these friends.  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  Our goal today was to decide about future therapy direction.  I explained my philosophy about using therapy intermittently when an issue comes up, rather than rummaging through the closet for things to talk about.  We decided to continue to check in on a regular basis, but not rummage with issues she has already dealt with in the past that are not of concern right now.  She has support, good insight, and the judgment to know when to come or call if there is a problem.  She listens to her husband when he notices changes as well.   Suicidal/Homicidal: No,  without intent/plan  Therapist Response: Also has no thoughts of  hitting herself.  Plan: Return again in 8 weeks, after the holidays Diagnosis: Axis I: Major Depression, Recurrent, mod    Axis II: Obsessive- Compulsive Personality  with routines and order very important, and tendency to obsessive thoughts    [facility], RN 12/14/2010

## 2010-12-31 ENCOUNTER — Ambulatory Visit (INDEPENDENT_AMBULATORY_CARE_PROVIDER_SITE_OTHER): Payer: 59 | Admitting: Psychiatry

## 2010-12-31 DIAGNOSIS — F331 Major depressive disorder, recurrent, moderate: Secondary | ICD-10-CM

## 2010-12-31 MED ORDER — DULOXETINE HCL 30 MG PO CPEP: ORAL_CAPSULE | ORAL | Status: DC

## 2010-12-31 MED ORDER — TRAZAMINE 50 MG PO MISC: 50.0000 mg | Freq: Every day | ORAL | Status: DC

## 2010-12-31 NOTE — Progress Notes (Signed)
Patient came for her followup appointment. She is taking Cymbalta 90 mg. She has been improving a lot. She started running and feel her energy is improved. She reported no side effects of medication. She is also taking trazodone which is helping her sleep. Overall she has less depression less anxiety and feel negative thoughts. She is seeing therapist regularly. She enjoys working out and exercise. She denies any crying spells anhedonia agitation or mood swings.  Mental status examination Patient is casually dressed and fairly groomed. Her speech is soft clear and coherent. She still has some anxiety however her affect is improved. She denies any active or passive suicidal thinking and homicidal thinking. Her attention and concentration is fair. She denies any auditory or visual hallucination. She's alert and oriented x3. There are no shakes tremors or extrapyramidal side effects noted. Her insight judgment and impulse control is okay.  Assessment Maj. depressive disorder  Plan I will continue Cymbalta 90 mg daily and trazodone 50 mg at bedtime. Patient reported no side effects of medication in this time. She will continue to see therapist regularly. I have explained risks and benefits of medication and recommended to call if she has any question or concern about the medication. We also talked about safety plan that in case of crisis or having any suicidal thoughts and homicidal thoughts and she need to call 911 or go to local ER. I will see her again in 2 months

## 2011-01-02 NOTE — Progress Notes (Signed)
Addended by: Kathryne Sharper T on: 01/02/2011 11:00 AM   Modules accepted: Kipp Brood

## 2011-02-08 ENCOUNTER — Ambulatory Visit (INDEPENDENT_AMBULATORY_CARE_PROVIDER_SITE_OTHER): Payer: 59 | Admitting: Psychology

## 2011-02-08 DIAGNOSIS — F330 Major depressive disorder, recurrent, mild: Secondary | ICD-10-CM

## 2011-02-08 NOTE — Progress Notes (Signed)
   THERAPIST PROGRESS NOTE  Session Time: 1030 - 1115  Participation Level: Active  Behavioral Response: Well GroomedAlert , Irritable  Type of Therapy: Individual Therapy  Treatment Goals addressed: Communication: with stepson and Coping  Interventions: Supportive, Family Systems and Reframing  Summary: [name] is a 36 y.o. female who presents with improved mood and a decrease in the thoughts of a self-deprecating nature. She reports the holidays were actually pleasant as she and her husband had a full week on their own and she recognized from that the tension that is added when her stepson is present. She is back into her routine of tennis and working out and will soon start her program of training for the first half-marathon coming up in March.  She describes her mood as positive with the exception of finding her stepson to be very irritating, so she is avoiding him more often.  She says she is sleeping well, managing her weight, and feeling sad now or during the holidays.  We spent most of the session talking about the changes in the step-son, now 16 and approaches to him that have or have not worked in the past.  It seems that she and her husband are hoping he "will grow out of this phase" in which he is argumentative, lacking in motivation and receiving failing grades in school.  "The tone of the house changes" when he is there, and he is there part of every week and every morning before school.  Now, she and/or husband have to monitor that he takes his medication for ADHD each morning.  She denies any possibility that he might be using marijuana or other drug or alcohol.  He did have therapy for a while in the past, but his mother never let him attend a session on his own.  We explored possible options, including having a psychiatrist see him for medication instead of continuing med. By his PCP that was recommended several years ago by his psychologist.    Suicidal/Homicidal:  Nowithout intent/plan  Therapist Response: Provided food for thought regarding her stepson, as his behavior is apparently affected her mood and relationship with his father.  Plan: Return again in 2-3 weeks.  Diagnosis: Axis I: Major Depression, Recurrent mild    Axis II: No diagnosis    Rayven Rettig, RN 02/08/2011

## 2011-02-25 ENCOUNTER — Ambulatory Visit (INDEPENDENT_AMBULATORY_CARE_PROVIDER_SITE_OTHER): Payer: 59 | Admitting: Psychiatry

## 2011-02-25 ENCOUNTER — Encounter (HOSPITAL_COMMUNITY): Payer: Self-pay | Admitting: Psychiatry

## 2011-02-25 VITALS — Wt 210.0 lb

## 2011-02-25 DIAGNOSIS — F329 Major depressive disorder, single episode, unspecified: Secondary | ICD-10-CM

## 2011-02-25 MED ORDER — DULOXETINE HCL 30 MG PO CPEP: ORAL_CAPSULE | ORAL | Status: DC

## 2011-02-25 MED ORDER — TRAZODONE HCL 50 MG PO TABS: 50.0000 mg | ORAL_TABLET | Freq: Every day | ORAL | Status: DC

## 2011-02-25 NOTE — Progress Notes (Signed)
Patient came for her followup appointment. She has a nice Christmas. She went to see her husband's family in [place]. Overall she has been stable on her medication. She likes the current dose of Cymbalta and trazodone for sleep. She is involved in her daily running and playing tennis 3 times a week. She is less depressed and less anxious. She feels her energy level is coming back. She has no issue with her medication. She denies any agitation anger crying spell her social isolation. She's also seeing a therapist on a regular basis.  Mental status examination Patient is casually dressed and fairly groomed. Her speech is soft clear and coherent. She described her mood is good and her affect is bright. She denies any active or passive suicidal thinking or homicidal thinking. There no psychotic symptoms present. Her attention and concentration is improved from the past. She's alert and oriented x3. There are no shakes tremors or extrapyramidal side effects noted. Her insight judgment and impulse control is okay.  Assessment Maj. depressive disorder  Plan I will continue Cymbalta 90 mg daily and trazodone 50 mg at bedtime. Patient reported no side effects of medication at this time. She will continue to see therapist regularly. I have explained risks and benefits of medication and I will see her in 3 months

## 2011-03-08 ENCOUNTER — Ambulatory Visit (INDEPENDENT_AMBULATORY_CARE_PROVIDER_SITE_OTHER): Payer: 59 | Admitting: Psychology

## 2011-03-08 DIAGNOSIS — F330 Major depressive disorder, recurrent, mild: Secondary | ICD-10-CM

## 2011-03-08 NOTE — Progress Notes (Signed)
   THERAPIST PROGRESS NOTE  Session Time: 1130 - 1220  Participation Level: Active  Behavioral Response: CasualAlertDepressed  Type of Therapy: Individual Therapy  Treatment Goals addressed: Coping  Interventions: Supportive, Family Systems and Reframing  Summary: [name] is a 36 y.o. female who presents with a feeling of slight depression, but may be more tiredness.  Ran a [employer] marathon 2 days ago and had a heavy workout day yesterday, so is really tired.  This exacerbated by conflict between husband and his son this morning.  They had a whole week without his son in the house last week and that was "so peaceful and relaxed", but he returned yesterday morning and now the tension is back.   We basically spent the session talking about responses to the son, her feelings about him and her concern for her husband.  They are planning a family meeting this week to try to address issues, so we did some thinking about how she might express her feelings in that forum honestly and helpfully.  Suicidal/Homicidal: No , without intent/plan  Therapist Response: We have spent two sessions related to the issue of stepson, so this seems to be a subject of considerable stress for her.  She is having good thoughts about the issue and positive intents, so I have encouraged her to actively participate in the family meeting, because her opinion counts too.   Plan: Return again in 3-4 weeks.  Diagnosis: Axis I: Major Depression, Recurrent mild    Axis II: No diagnosis    Beren Yniguez, RN 03/08/2011

## 2011-03-18 ENCOUNTER — Other Ambulatory Visit: Payer: Self-pay | Admitting: Obstetrics and Gynecology

## 2011-03-18 NOTE — Telephone Encounter (Signed)
rx called in

## 2011-04-19 ENCOUNTER — Ambulatory Visit (HOSPITAL_COMMUNITY): Payer: Self-pay | Admitting: Psychology

## 2011-05-17 ENCOUNTER — Ambulatory Visit (INDEPENDENT_AMBULATORY_CARE_PROVIDER_SITE_OTHER): Payer: 59 | Admitting: Psychology

## 2011-05-17 DIAGNOSIS — F330 Major depressive disorder, recurrent, mild: Secondary | ICD-10-CM

## 2011-05-17 NOTE — Progress Notes (Signed)
   THERAPIST PROGRESS NOTE  Session Time: 1030 - 1100  Participation Level: Active  Behavioral Response: CasualAlertEuthymic  Type of Therapy: Individual Therapy  Treatment Goals addressed: Coping  Interventions: Psychosocial Skills: talking with teens and Supportive  Summary: [name] is a 36 y.o. female who presents with report that things are going well.  We reviewed the goal from last time, to have family meeting to talk with stepson about his disrespectful behavior.  Although the meeting did not happen, her father and she talked with him separately and the behavior has improved.  She has not slept as well for one week, waking up "too hot", but otherwise her mood has been stable, no depression other than a temporary dip that she has treated by getting busy doing "something else".  She reports medication compliance and positive plans for 1/2 marathon this weekend, bike ride in [place] with her husband that will involve a family road trip, and other plans for the summer.   States she and her husband have talked about whether she should go back to work. "He says, I am the best he has seen me now and he thinks that we are better off with me not working." She is content and has her training schedule to keep herself in a routine.   Suicidal/Homicidal: Nowithout intent/plan  Denies and of her past self-deprecatory thoughts as well.  Therapist Response: Talked with her about my coming retirement and her preferences.  She elected to have the name of a therapist she can keep in touch with at intervals or PRN.  I will send her letter with the name of that person.  Plan: Return again PRN  Diagnosis: Axis I: Major Depression, Recurrent in remission    Axis II: No diagnosis    Davieon Stockham, RN 05/17/2011

## 2011-05-27 ENCOUNTER — Ambulatory Visit (HOSPITAL_COMMUNITY): Payer: 59 | Admitting: Psychiatry

## 2011-05-30 ENCOUNTER — Ambulatory Visit (INDEPENDENT_AMBULATORY_CARE_PROVIDER_SITE_OTHER): Payer: 59 | Admitting: Psychiatry

## 2011-05-30 DIAGNOSIS — F329 Major depressive disorder, single episode, unspecified: Secondary | ICD-10-CM

## 2011-05-30 MED ORDER — TRAZODONE HCL 50 MG PO TABS: 50.0000 mg | ORAL_TABLET | Freq: Every day | ORAL | Status: DC

## 2011-05-30 MED ORDER — DULOXETINE HCL 30 MG PO CPEP: ORAL_CAPSULE | ORAL | Status: DC

## 2011-05-30 MED ORDER — TRAZODONE HCL 100 MG PO TABS: 50.0000 mg | ORAL_TABLET | Freq: Every day | ORAL | Status: DC
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
## 2011-07-12 ENCOUNTER — Other Ambulatory Visit (HOSPITAL_COMMUNITY): Payer: Self-pay | Admitting: *Deleted

## 2011-07-12 DIAGNOSIS — F329 Major depressive disorder, single episode, unspecified: Secondary | ICD-10-CM

## 2011-07-12 MED ORDER — TRAZODONE HCL 50 MG PO TABS: 50.0000 mg | ORAL_TABLET | Freq: Every day | ORAL | Status: DC

## 2011-07-13 ENCOUNTER — Other Ambulatory Visit (HOSPITAL_COMMUNITY): Payer: Self-pay | Admitting: *Deleted

## 2011-07-14 ENCOUNTER — Encounter: Payer: Self-pay | Admitting: Obstetrics and Gynecology

## 2011-07-14 ENCOUNTER — Other Ambulatory Visit (HOSPITAL_COMMUNITY): Payer: Self-pay | Admitting: *Deleted

## 2011-07-14 DIAGNOSIS — F329 Major depressive disorder, single episode, unspecified: Secondary | ICD-10-CM

## 2011-07-14 MED ORDER — TRAZODONE HCL 50 MG PO TABS: 50.0000 mg | ORAL_TABLET | Freq: Every day | ORAL | Status: DC

## 2011-07-14 NOTE — Telephone Encounter (Signed)
RX originally sent in error to [facility] on 07/12/11. Pharmacy corrected to [facility] on [address]

## 2011-07-19 ENCOUNTER — Encounter: Payer: Self-pay | Admitting: Obstetrics and Gynecology

## 2011-07-19 ENCOUNTER — Ambulatory Visit (HOSPITAL_COMMUNITY): Payer: Self-pay | Admitting: Licensed Clinical Social Worker

## 2011-07-19 ENCOUNTER — Ambulatory Visit (INDEPENDENT_AMBULATORY_CARE_PROVIDER_SITE_OTHER): Payer: 59 | Admitting: Obstetrics and Gynecology

## 2011-07-19 ENCOUNTER — Other Ambulatory Visit (HOSPITAL_COMMUNITY)
Admission: RE | Admit: 2011-07-19 | Discharge: 2011-07-19 | Disposition: A | Discharge status: Home / Self Care | Payer: 59 | Source: Ambulatory Visit | Attending: Obstetrics and Gynecology | Admitting: Obstetrics and Gynecology

## 2011-07-19 VITALS — BP 120/76 | Ht 68.0 in | Wt 207.0 lb

## 2011-07-19 DIAGNOSIS — Z01419 Encounter for gynecological examination (general) (routine) without abnormal findings: Secondary | ICD-10-CM

## 2011-07-19 MED ORDER — NORGESTIMATE-ETH ESTRADIOL 0.25-35 MG-MCG PO TABS: 1.0000 | ORAL_TABLET | Freq: Every day | ORAL | Status: DC

## 2011-07-19 NOTE — Patient Instructions (Signed)
Return fasted for lab work. 

## 2011-07-19 NOTE — Progress Notes (Signed)
Patient came to see me today for her annual GYN exam. She remains on birth control pills without problems. She has normal cycles. She is having no pelvic pain. In 2008 she had a Pap smear showing ascus. High risk HPV was not detected. She then had normal Paps until 2011. Once again high-risk HPV was not detected. She has had 2 normal Paps since then. The last was 2012. Last year her cholesterol was 248 with an HDL of 83 an LDL of 136. She did not fast today.  Physical examination: [name] present. HEENT within normal limits. Neck: Thyroid not large. No masses. Supraclavicular nodes: not enlarged. Breasts: Examined in both sitting and lying  position. No skin changes and no masses. Abdomen: Soft no guarding rebound or masses or hernia. Pelvic: External: Within normal limits. BUS: Within normal limits. Vaginal:within normal limits. Good estrogen effect. No evidence of cystocele rectocele or enterocele. Cervix: clean. Uterus: Normal size and shape. Adnexa: No masses. Rectovaginal exam: Confirmatory and negative. Extremities: Within normal limits.  Assessment: History of abnormal Pap smears-see above. Slightly elevated LDL.  Plan: Continue birth control pills. Return fasting for her lipid profile. Discussed new Pap smear screening guidelines. We elected to do one more pap this year and if all okay go to new guidelines.

## 2011-07-20 LAB — URINALYSIS W MICROSCOPIC + REFLEX CULTURE
Bilirubin Urine: NEGATIVE
Casts: NONE SEEN
Glucose, UA: NEGATIVE mg/dL
Hgb urine dipstick: NEGATIVE
Ketones, ur: NEGATIVE mg/dL
Nitrite: NEGATIVE
Specific Gravity, Urine: 1.019 (ref 1.005–1.030)
pH: 5.5 (ref 5.0–8.0)

## 2011-07-25 ENCOUNTER — Other Ambulatory Visit: Payer: Self-pay | Admitting: Obstetrics and Gynecology

## 2011-07-25 DIAGNOSIS — N390 Urinary tract infection, site not specified: Secondary | ICD-10-CM

## 2011-07-25 MED ORDER — AMOXICILLIN 250 MG PO CAPS: 250.0000 mg | ORAL_CAPSULE | Freq: Three times a day (TID) | ORAL | Status: AC

## 2011-08-01 ENCOUNTER — Encounter (HOSPITAL_COMMUNITY): Payer: Self-pay | Admitting: Psychiatry

## 2011-08-01 ENCOUNTER — Other Ambulatory Visit (HOSPITAL_COMMUNITY): Payer: Self-pay | Admitting: *Deleted

## 2011-08-01 ENCOUNTER — Ambulatory Visit (INDEPENDENT_AMBULATORY_CARE_PROVIDER_SITE_OTHER): Payer: 59 | Admitting: Psychiatry

## 2011-08-01 DIAGNOSIS — F329 Major depressive disorder, single episode, unspecified: Secondary | ICD-10-CM

## 2011-08-01 MED ORDER — TRAZODONE HCL 50 MG PO TABS: 50.0000 mg | ORAL_TABLET | Freq: Every day | ORAL | Status: DC

## 2011-08-01 MED ORDER — DULOXETINE HCL 30 MG PO CPEP: ORAL_CAPSULE | ORAL | Status: DC

## 2011-08-01 NOTE — Progress Notes (Signed)
Chief complaint Medication management and followup.  History presenting illness Patient is 36 year old Caucasian married female who came for her followup appointment.  She is been compliant with her medication and reported no side effects.  She's excited about upcoming event in August in [place].  She is practicing for 56 mile run and 1 mile swim.  She's been practicing and excited about that even.  She endorse good energy level.  She denies any recent crying spells agitation anger mood swing.  She sleeping better.  She likes her current psychiatric medication.  Her appetite is unchanged from the past.  She has not taken any Ambien in recent weeks.  Patient is scheduled to see Burke Keels at the end of this month.  She was seeing therapist who will be retiring soon.  Current psychiatric medication Trazodone 50 mg at bedtime Cymbalta 90 mg daily  Past psychiatric history Patient endorse history of depression since her teens.  She denies any history of suicidal attempt or any inpatient psychiatric treatment.  In the past she had tried Paxil and Lunesta with limited response.  Alcohol and drug history Patient has a history of alcohol or illegal substance.  Medical history Patient has history of migraine headache.  She see Dr Maud Deed who is her OB/GYN.  Mental status examination Patient is casually dressed and fairly groomed. Her speech is soft clear and coherent. She described her mood is neutral and her affect is appropriate.  She denies any active or passive suicidal thinking or homicidal thinking. There no psychotic symptoms present.  She denies any auditory or visual hallucination .  Her attention and concentration is okay. She's alert and oriented x3. There are no shakes tremors or extrapyramidal side effects noted. Her insight judgment and impulse control is okay.  Assessment Axis I Maj. depressive disorder Axis II deferred Axis III history of migraine Axis IV mild to  moderate  Plan I will continue Cymbalta 90 mg daily and trazodone 50 mg at bedtime. Patient reported no side effects of medication at this time.  She will schedule to see her new therapist at the end of this month. I recommend to call us if she feels worsening of her symptoms or if she has any question or concern about the medication.  I will see her again in 3 months.

## 2011-08-08 ENCOUNTER — Other Ambulatory Visit: Payer: 59

## 2011-08-08 DIAGNOSIS — Z01419 Encounter for gynecological examination (general) (routine) without abnormal findings: Secondary | ICD-10-CM

## 2011-08-08 DIAGNOSIS — N390 Urinary tract infection, site not specified: Secondary | ICD-10-CM

## 2011-08-08 LAB — CBC WITH DIFFERENTIAL/PLATELET
Basophils Relative: 0 % (ref 0–1)
Eosinophils Absolute: 0.1 10*3/uL (ref 0.0–0.7)
Hemoglobin: 13 g/dL (ref 12.0–15.0)
Lymphs Abs: 2.1 10*3/uL (ref 0.7–4.0)
MCH: 31.6 pg (ref 26.0–34.0)
MCHC: 34.3 g/dL (ref 30.0–36.0)
Neutro Abs: 7.5 10*3/uL (ref 1.7–7.7)
Neutrophils Relative %: 73 % (ref 43–77)
Platelets: 351 10*3/uL (ref 150–400)
RBC: 4.11 MIL/uL (ref 3.87–5.11)

## 2011-08-08 LAB — LIPID PANEL
Cholesterol: 227 mg/dL — ABNORMAL HIGH (ref 0–200)
LDL Cholesterol: 118 mg/dL — ABNORMAL HIGH (ref 0–99)
Total CHOL/HDL Ratio: 2.6 Ratio
VLDL: 23 mg/dL (ref 0–40)

## 2011-08-10 LAB — URINE CULTURE: Colony Count: 75000

## 2011-08-18 ENCOUNTER — Ambulatory Visit (INDEPENDENT_AMBULATORY_CARE_PROVIDER_SITE_OTHER): Payer: 59 | Admitting: Licensed Clinical Social Worker

## 2011-08-18 ENCOUNTER — Encounter (HOSPITAL_COMMUNITY): Payer: Self-pay | Admitting: Licensed Clinical Social Worker

## 2011-08-18 DIAGNOSIS — F329 Major depressive disorder, single episode, unspecified: Secondary | ICD-10-CM

## 2011-08-18 NOTE — Progress Notes (Signed)
   THERAPIST PROGRESS NOTE  Session Time: 10:30am-11:20am  Participation Level: Active  Behavioral Response: Well GroomedAlertEuthymic  Type of Therapy: Individual Therapy  Treatment Goals addressed: Coping  Interventions: CBT, Supportive and Reframing  Summary: [name] is a 36 y.o. female who presents with euthymic mood and bright affect. This is patients first session with this Clinical research associate. She has been treated by Shonna Chock in the past for depression and currently sees Dr. Lolly Mustache for medication management. She provides a brief history, which includes depression since she was a teenager, with the most recent depressive episode about one year ago. She denies any current depression or anxiety. Her sleep has improved since starting Trazadone and her appetite is wnl. She has an excellent support network, a supportive husband and a happy marriage. 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 She believes her depression is chemically based, as she experiences episodes without any triggering event. When she is depressed she identifies strong negative self talk, with feelings of self hatred, with has lead to SIB in the past, the most recent she threw herself down the stairs and broke her arm. She denies any desire to temptation for SIB lately.    Suicidal/Homicidal: Nowithout intent/plan  Therapist Response: Assessed patients current functioning and reviewed her progress. Discussed her history and established a new therapeutic relationship. Explored her goals for therapy since her depression is currently well controlled. Will focus on her core belief of self hatred which she focuses on when depressed and build additional coping strategies. Her thinking is clear, goal directed and healthy. She exercises and takes good care of herself. Reviewed patients self care plan. Assessed her  progress related to self care. Patient's self care is good. Recommend proper diet, regular exercise, socialization and recreation.   Plan: Return again in three weeks.  Diagnosis: Axis I: Depressive Disorder NOS    Axis II: No diagnosis    [name], LCSW 08/18/2011

## 2011-08-28 ENCOUNTER — Other Ambulatory Visit: Payer: Self-pay | Admitting: Obstetrics and Gynecology

## 2011-09-09 ENCOUNTER — Ambulatory Visit (INDEPENDENT_AMBULATORY_CARE_PROVIDER_SITE_OTHER): Payer: 59 | Admitting: Licensed Clinical Social Worker

## 2011-09-09 DIAGNOSIS — F329 Major depressive disorder, single episode, unspecified: Secondary | ICD-10-CM

## 2011-09-09 NOTE — Progress Notes (Signed)
   THERAPIST PROGRESS NOTE  Session Time: 9:30am-10:20am  Participation Level: Active  Behavioral Response: Well GroomedAlertEuthymic  Type of Therapy: Individual Therapy  Treatment Goals addressed: Coping  Interventions: CBT, Strength-based, Supportive and Reframing  Summary: [name] is a 36 y.o. female who presents with euthymic mood and affect. She reports recently returning from a trip with her family and in laws to [place] for a road bike race. She describes the trip and the challenges associated with spending a lot of time in the car with her in laws and step son. She is pleased that she handled the trip well, better than she has in the past and was able to manage and navigate any irritation she had by increasing alone time. She processes her frustration with her mother in law and how she feels misunderstood by her. Her depression is well controlled right now, but she is concerned over her strong negative self statements when she does become depressed. She does not understand where they come from and wants to learn how to manage these thoughts. Her sleep and appetite are wnl.    Suicidal/Homicidal: Nowithout intent/plan  Therapist Response: Assessed patients current functioning and reviewed progress. Reviewed coping strategies. Assessed patients safety and assisted in identifying protective factors.  Reviewed crisis plan with patient. Assisted patient with the expression of her feelings of frustration and low self esteem. Explored patients negative self talk. She has strong negative feelings about her body and processes what she does not like about it. She demonstrates strong perfectionist and unrealistic expectations of herself. Used CBT to assist patient with the identification of negative distortions and irrational thoughts. Encouraged patient to verbalize alternative and factual responses which challenge thought distortions. Reviewed patients self care plan. Assessed  progress  related to self care. Patient's self care is good. Recommend proper diet, regular exercise, socialization and recreation.   Plan: Return again in two weeks.  Diagnosis: Axis I: Major Depression, Recurrent severe    Axis II: No diagnosis    Klaryssa Fauth, LCSW 09/09/2011

## 2011-09-20 ENCOUNTER — Other Ambulatory Visit: Payer: Self-pay | Admitting: *Deleted

## 2011-09-20 MED ORDER — ZOLPIDEM TARTRATE 10 MG PO TABS: 10.0000 mg | ORAL_TABLET | Freq: Every evening | ORAL | Status: DC | PRN

## 2011-09-20 NOTE — Telephone Encounter (Signed)
Last refill 06/16/11

## 2011-09-28 ENCOUNTER — Encounter (HOSPITAL_COMMUNITY): Payer: Self-pay | Admitting: Licensed Clinical Social Worker

## 2011-09-28 ENCOUNTER — Ambulatory Visit (INDEPENDENT_AMBULATORY_CARE_PROVIDER_SITE_OTHER): Payer: 59 | Admitting: Licensed Clinical Social Worker

## 2011-09-28 DIAGNOSIS — F329 Major depressive disorder, single episode, unspecified: Secondary | ICD-10-CM

## 2011-09-28 NOTE — Progress Notes (Signed)
   THERAPIST PROGRESS NOTE  Session Time: 11:30am-12:20pm  Participation Level: Active  Behavioral Response: Well GroomedAlertEuthymic  Type of Therapy: Individual Therapy  Treatment Goals addressed: Coping  Interventions: CBT, Strength-based, Supportive and Reframing  Summary: [name] is a 36 y.o. female who presents with euthymic mood and bright affect. She reports that she has been feeling well, with an absence of depression. She endorses frustration and agitation with her step-sons behavior now that school has started. She processes her agitation that he does not do what is required of him unless she tells him multiple times and takes things away from him as punishment. She endorses guilt over her feeling regarding him when she does not want him to be part of her day. She demonstrates a natural response to teenage rebellion. She reports that she has thought about negative messages she may have received from her family growing up and she processes how she has been affected by high expectations and a message from her school that she did not accomplish anything on her own, but that instead, it was God who did these things. She believes this has translated to low self esteem and the expression of self hatred when she is depressed. Her sleep and appetite are wnl.    Suicidal/Homicidal: Nowithout intent/plan  Therapist Response: Assessed patients current functioning and reviewed progress. Reviewed coping strategies. Assessed patients safety and assisted in identifying protective factors.  Reviewed crisis plan with patient. Assisted patient with the expression of her feelings of frustration. Her thinking is somewhat unrealistic regarding her step son. She engages in self blame. Used CBT to assist patient with the identification of negative distortions and irrational thoughts. Encouraged patient to verbalize alternative and factual responses which challenge thought distortions. Explored the  messages she received from her family and teachers. Discussed and created a treatment plan. Reviewed patients self care plan. Assessed  progress related to self care. Patient's self care is good. Recommend proper diet, regular exercise, socialization and recreation.   Plan: Return again in two weeks.  Diagnosis: Axis I: Depressive Disorder NOS    Axis II: No diagnosis    Delvonte Berenson, LCSW 09/28/2011

## 2011-10-13 ENCOUNTER — Encounter (HOSPITAL_COMMUNITY): Payer: Self-pay | Admitting: Licensed Clinical Social Worker

## 2011-10-13 ENCOUNTER — Ambulatory Visit (INDEPENDENT_AMBULATORY_CARE_PROVIDER_SITE_OTHER): Payer: 59 | Admitting: Licensed Clinical Social Worker

## 2011-10-13 DIAGNOSIS — F329 Major depressive disorder, single episode, unspecified: Secondary | ICD-10-CM

## 2011-10-13 NOTE — Progress Notes (Signed)
   THERAPIST PROGRESS NOTE  Session Time: 3:00pm-3:50pm  Participation Level: Active  Behavioral Response: Well GroomedAlertEuthymic  Type of Therapy: Individual Therapy  Treatment Goals addressed: Coping  Interventions: CBT, Strength-based and Reframing  Summary: [name] is a 36 y.o. female who presents with euthymic mood and tired affect. She is training hard for a half Iron Man race and reports feeling exhausted. She reports good mood stability and good sleep. She has been thinking more about her negative thought processes, her need for perfection in all she does and her unrealistic expectations of herself. She explores her school environmnet and the messages she was taught. She is particularly bothered by the teaching that she is not responsible for anything good that happens to her and that she is respsonsible for bad things happending to others. She believes that if she doesn't do something "perfect" then she has failed.   Suicidal/Homicidal: Nowithout intent/plan  Therapist Response: Assessed patients current functioning and reviewed progress. Reviewed coping strategies. Assessed patients safety and assisted in identifying protective factors.  Reviewed crisis plan with patient. Assisted patient with the expression of her feelings of frustration with herself. Patient demonstrates strong negative and rigid thinking. Used CBT to assist patient with the identification of negative distortions and irrational thoughts. Encouraged patient to verbalize alternative and factual responses which challenge thought distortions. Assigned CBT homework and recommend journaling. Reviewed patients self care plan. Assessed  progress related to self care. Patient's self care is good. Recommend proper diet, regular exercise, socialization and recreation.   Plan: Return again in two weeks.  Diagnosis: Axis I: Depressive Disorder NOS    Axis II: No diagnosis    Mykah Bellomo,  LCSW 10/13/2011

## 2011-10-27 ENCOUNTER — Encounter (HOSPITAL_COMMUNITY): Payer: Self-pay | Admitting: Licensed Clinical Social Worker

## 2011-10-27 ENCOUNTER — Ambulatory Visit (INDEPENDENT_AMBULATORY_CARE_PROVIDER_SITE_OTHER): Payer: 59 | Admitting: Licensed Clinical Social Worker

## 2011-10-27 DIAGNOSIS — F329 Major depressive disorder, single episode, unspecified: Secondary | ICD-10-CM

## 2011-10-27 NOTE — Progress Notes (Signed)
   THERAPIST PROGRESS NOTE  Session Time: 1:00pm-1:50pm  Participation Level: Active  Behavioral Response: Well GroomedAlertEuthymic  Type of Therapy: Individual Therapy  Treatment Goals addressed: Coping  Interventions: CBT, Strength-based, Supportive and Reframing  Summary: [name] is a 36 y.o. female who presents with euthymic mood and tired affect. She finished her first 1/2 iron man triathlon on Sunday and is proud of herself. She is pleased that she was mentally able to challenge herself to finish this race. She reports noticing her black and white thinking in her journal writing and endorses frequent automatic negative self talk about her appearance. She identifies herself as the "cubby fat girl" and avoids looking in the mirror. She "hates" getting dressed to go out to dinner and is pre-occupied by negative thoughts about how her clothes feel and look. She reports that when she becomes depressed, her negative thinking about her body becomes paralyzing and she is unable to leave the house. She struggles to identify anything positive about herself besides her nose. Her sleep and appetite are wnl.   Suicidal/Homicidal: Nowithout intent/plan  Therapist Response: Assessed patients current functioning and reviewed progress. Reviewed coping strategies. Assessed patients safety and assisted in identifying protective factors.  Reviewed crisis plan with patient. Assisted patient with the expression of her feelings of anxiety and frustration about her body. Used CBT to assist patient with the identification of negative distortions and irrational thoughts. Encouraged patient to verbalize alternative and factual responses which challenge thought distortions. Completed thought record in session about her body. Reviewed patients self care plan. Assessed  progress related to self care. Patient's self care is good. Recommend proper diet, regular exercise, socialization and recreation.   Plan:  Return again in two weeks.  Diagnosis: Axis I: Major Depression, Recurrent severe    Axis II: No diagnosis    Journiee Feldkamp, LCSW 10/27/2011

## 2011-11-02 ENCOUNTER — Encounter (HOSPITAL_COMMUNITY): Payer: Self-pay | Admitting: Psychiatry

## 2011-11-02 ENCOUNTER — Ambulatory Visit (INDEPENDENT_AMBULATORY_CARE_PROVIDER_SITE_OTHER): Payer: 59 | Admitting: Psychiatry

## 2011-11-02 VITALS — BP 127/83 | HR 72 | Ht 68.0 in | Wt 198.2 lb

## 2011-11-02 DIAGNOSIS — F329 Major depressive disorder, single episode, unspecified: Secondary | ICD-10-CM

## 2011-11-02 DIAGNOSIS — Z79899 Other long term (current) drug therapy: Secondary | ICD-10-CM

## 2011-11-02 MED ORDER — TRAZODONE HCL 50 MG PO TABS: 50.0000 mg | ORAL_TABLET | Freq: Every day | ORAL | Status: DC

## 2011-11-02 MED ORDER — DULOXETINE HCL 30 MG PO CPEP: ORAL_CAPSULE | ORAL | Status: DC

## 2011-11-02 NOTE — Progress Notes (Signed)
Chief complaint Medication management and followup.  History presenting illness Patient is 36 year old Caucasian married female who came for her followup appointment.  She is been compliant with her medication and reported no side effects.  She ran in August in [place] and it was tiring but she enjoyed .  She likes her current psychiatric medication.  She likes trazodone.  She feel more energetic and denies any recent depression or crying spells.  She is more social and active in her daily life.  She is seeing therapist for coping skills.  She denies any recent feelings of hopelessness or helplessness.  She was seen recently by her primary care physician .  She has blood work which shows mild elevation of cholesterol however her CBC was normal.  There was no chemistry and hemoglobin A1c was done.  Patient denies any agitation anger mood swing.  Her sleep appetite and vitals are stable.  She does not require Ambien anymore.  Patient is sad as her primary care physician is retiring.  Current psychiatric medication Trazodone 50 mg at bedtime Cymbalta 90 mg daily  Past psychiatric history Patient endorse history of depression since her teens.  She denies any history of suicidal attempt or any inpatient psychiatric treatment.  In the past she had tried Paxil and Lunesta with limited response.  Alcohol and drug history Patient denies any history of alcohol or illegal substance.  Medical history Patient has history of migraine headache.  She see Dr Maud Deed who is her OB/GYN.  Mental status examination Patient is casually dressed and fairly groomed.  She maintained good eye contact.  Her speech is soft clear with normal tone and volume .  Her thought process is logical linear and goal-directed.  She described her mood is good and her affect is pleasant.  She denies any active or passive suicidal thinking or homicidal thinking. There no psychotic symptoms present.  She denies any auditory or  visual hallucination .  Her attention and concentration is okay. She's alert and oriented x3. There are no shakes tremors or extrapyramidal side effects noted. Her insight judgment and impulse control is okay.  Assessment Axis I Maj. depressive disorder Axis II deferred Axis III history of migraine Axis IV mild to moderate  Plan I review her blood work which was done in July however she does not have chemistry and hemoglobin A1c.  Her CBC is within normal limit.  I will continue her Cymbalta and trazodone.  We'll order chemistry and hemoglobin A1c.  At this time patient is fairly stable on her current psychiatric medication.  She seeing therapist and doing regular exercise and running.  I recommend to call us if she is any question or concern about the medication if she feels worsening of the symptom.  I will see her again in 3 months.  Greater than 50% of the time spent in counseling and coordination of care.

## 2011-11-03 LAB — COMPREHENSIVE METABOLIC PANEL
Albumin: 4.1 g/dL (ref 3.5–5.2)
BUN: 11 mg/dL (ref 6–23)
Calcium: 9.1 mg/dL (ref 8.4–10.5)
Chloride: 102 mEq/L (ref 96–112)
Creat: 0.87 mg/dL (ref 0.50–1.10)
Glucose, Bld: 82 mg/dL (ref 70–99)
Potassium: 4.3 mEq/L (ref 3.5–5.3)

## 2011-11-08 ENCOUNTER — Other Ambulatory Visit (HOSPITAL_COMMUNITY): Payer: Self-pay | Admitting: Psychiatry

## 2011-11-08 DIAGNOSIS — F329 Major depressive disorder, single episode, unspecified: Secondary | ICD-10-CM

## 2011-11-08 MED ORDER — TRAZODONE HCL 50 MG PO TABS: 50.0000 mg | ORAL_TABLET | Freq: Every day | ORAL | Status: DC

## 2011-11-09 ENCOUNTER — Other Ambulatory Visit (HOSPITAL_COMMUNITY): Payer: Self-pay | Admitting: Psychiatry

## 2011-11-10 ENCOUNTER — Ambulatory Visit (INDEPENDENT_AMBULATORY_CARE_PROVIDER_SITE_OTHER): Payer: 59 | Admitting: Licensed Clinical Social Worker

## 2011-11-10 DIAGNOSIS — F329 Major depressive disorder, single episode, unspecified: Secondary | ICD-10-CM

## 2011-11-10 NOTE — Progress Notes (Signed)
   THERAPIST PROGRESS NOTE  Session Time: 1:00pm-1:50pm  Participation Level: Active  Behavioral Response: Well GroomedAlertDepressed  Type of Therapy: Individual Therapy  Treatment Goals addressed: Coping  Interventions: CBT, Motivational Interviewing, Supportive and Reframing  Summary: [name] is a 36 y.o. female who presents with depressed mood and flat affect. She reports poor motivation to begin exercising since she does not have a training goal. She is looking for another goal and wants to run a 1/2 Marathon. She is frustrated with herself and her lack of motivation and has unrealistic expectations of herself. She is not looking forward to accompaning her husband to a conference in November because it requires more socializing than she is comfortable with. She is tearful when processing how she feels as though she does not fit in, has nothing in common with the other wives, feels fat and ugly. She wants to attend even though she doesn't have to and feels guilty if she chooses to stay home. Her sleep and appetite are wnl.    Suicidal/Homicidal: Nowithout intent/plan  Therapist Response: Assessed patients current functioning and reviewed progress. Reviewed coping strategies. Assessed patients safety and assisted in identifying protective factors.  Reviewed crisis plan with patient. Assisted patient with the expression of her feelings of depression and frustration with self. . Patients thoughts are negative and distorted, particularly around body image. Her expectations of herself are unrealistic. Used CBT to assist patient with the identification of negative distortions and irrational thoughts. Encouraged patient to verbalize alternative and factual responses which challenge thought distortions. Reviewed patients self care plan. Assessed  progress related to self care. Patient's self care is good. Recommend proper diet, regular exercise, socialization and recreation. Used  motivational interviewing to assist and encourage patient through the change process. Explored patients barriers to change.   Plan: Return again in two weeks.  Diagnosis: Axis I: Depressive Disorder NOS    Axis II: No diagnosis    Advith Martine, LCSW 11/10/2011

## 2011-11-24 ENCOUNTER — Ambulatory Visit (INDEPENDENT_AMBULATORY_CARE_PROVIDER_SITE_OTHER): Payer: 59 | Admitting: Licensed Clinical Social Worker

## 2011-11-24 ENCOUNTER — Encounter (HOSPITAL_COMMUNITY): Payer: Self-pay | Admitting: Licensed Clinical Social Worker

## 2011-11-24 DIAGNOSIS — F329 Major depressive disorder, single episode, unspecified: Secondary | ICD-10-CM

## 2011-11-24 NOTE — Progress Notes (Signed)
   THERAPIST PROGRESS NOTE  Session Time: 1:00pm-1:50pm  Participation Level: Active  Behavioral Response: Well GroomedAlertAnxious and Depressed  Type of Therapy: Individual Therapy  Treatment Goals addressed: Coping  Interventions: CBT, DBT, Motivational Interviewing, Solution Focused, Strength-based, Supportive and Reframing  Summary: Yolanda LAPENNA is a 36 y.o. female who presents with depressed mood and tearful affect. She reports increased feelings of depression, increased isolation, poor motivation, anhedonia, increased appetite and sleep and crying spells. She does not identify any triggering event or stressor. She is frustrated that she is depressed again and angry that she has to go through this again. She is able to talk to her husband about this and he is supportive, but she is private and does not share with her friends. She tries to pretend that she is all right. She must travel to [facility] tomorrow with her husband and is not looking forward to this. She endorses negative self talk surrounding her weight and body image.    Suicidal/Homicidal: Nowithout intent/plan  Therapist Response: Assessed patients current functioning and reviewed progress. Reviewed coping strategies. Assessed patients safety and assisted in identifying protective factors.  Reviewed crisis plan with patient. Assisted patient with the expression of her feelings of depression and frustration. Patients thoughts are negative and distorted. Used CBT to assist patient with the identification of negative distortions and irrational thoughts. Encouraged patient to verbalize alternative and factual responses which challenge thought distortions. Used problem solving to help patient plan her time in [place] and when she returns. Encourage patient to travel to her parents at the beach as she identifies this as helpful. Challenged patients guilt around self care. Used motivational interviewing to assist and encourage  patient through the change process. Explored patients barriers to change. Used DBT to practice mindfulness, review distraction list and improve distress tolerance skills. Reviewed patients self care plan. Assessed  progress related to self care. Patient's self care is good. Recommend proper diet, regular exercise, socialization and recreation. Recommend patient see Dr. Lolly Mustache as soon as possible for medication adjustment.   Plan: Return again in one weeks.  Diagnosis: Axis I: Depressive Disorder NOS    Axis II: No diagnosis    Bambi Fehnel, LCSW 11/24/2011

## 2011-11-29 ENCOUNTER — Ambulatory Visit (INDEPENDENT_AMBULATORY_CARE_PROVIDER_SITE_OTHER): Payer: 59 | Admitting: Psychiatry

## 2011-11-29 ENCOUNTER — Encounter (HOSPITAL_COMMUNITY): Payer: Self-pay | Admitting: Psychiatry

## 2011-11-29 DIAGNOSIS — F329 Major depressive disorder, single episode, unspecified: Secondary | ICD-10-CM

## 2011-11-29 NOTE — Progress Notes (Signed)
Chief complaint My therapist felt that I need to see you early.    History presenting illness Patient is 36 year old Caucasian married female who came for her followup appointment earlier than her scheduled appointment.  Patient was seen by therapist and endorse increased anxiety and depression and this appointment was made.  Patient feeling better now. It is unclear what causes her more depressed and anxious.  She was noticing decreased energy crying spells and social isolation.  Patient admitted every year around this time she go into social isolation but eventually come back.  Now patient is feeling better and stop running again to participate in event at [facility]..  She's compliant with the medication and denies any side effects.  She sleeping better.  She denies any active or passive suicidal thoughts.  She does not want to change or increase her dose of medication.  She has no concern.  She has hemoglobin A1c and chemistry which was normal.  She's not drinking or using any illegal substance.  She like to continue seeing her therapist and if she notices any change in her mood that she will let us know.  Current psychiatric medication Trazodone 50 mg at bedtime Cymbalta 90 mg daily  Past psychiatric history Patient endorse history of depression since her teens.  She denies any history of suicidal attempt or any inpatient psychiatric treatment.  In the past she had tried Paxil and Lunesta with limited response.  Alcohol and drug history Patient denies any history of alcohol or illegal substance.  Medical history Patient has history of migraine headache.  She see Dr Maud Deed who is her OB/GYN.  Mental status examination Patient is casually dressed and fairly groomed.  She maintained fair eye contact.  Her speech is soft clear with normal tone and volume .  Her thought process is logical linear and goal-directed.  She described her mood is okay and her affect is mood appropriate.   She  denies any active or passive suicidal thinking or homicidal thinking. There no psychotic symptoms present.  She denies any auditory or visual hallucination .  Her attention and concentration is okay. She's alert and oriented x3. There are no shakes tremors or extrapyramidal side effects noted. Her insight judgment and impulse control is okay.  Assessment Axis I Maj. depressive disorder Axis II deferred Axis III history of migraine Axis IV mild to moderate  Plan Reassurance given, I will continue her current psychiatric medication and recommend to see therapist for coping and social skills.  Keep appointment 2 months from now however I recommend to call us if she is any question or concern if he feel worsening of the symptom.  Patient acknowledged.

## 2011-12-08 ENCOUNTER — Encounter (HOSPITAL_COMMUNITY): Payer: Self-pay | Admitting: Licensed Clinical Social Worker

## 2011-12-08 ENCOUNTER — Ambulatory Visit (INDEPENDENT_AMBULATORY_CARE_PROVIDER_SITE_OTHER): Payer: 59 | Admitting: Licensed Clinical Social Worker

## 2011-12-08 DIAGNOSIS — F329 Major depressive disorder, single episode, unspecified: Secondary | ICD-10-CM

## 2011-12-08 NOTE — Progress Notes (Signed)
   THERAPIST PROGRESS NOTE  Session Time: 1:00pm-1:50pm  Participation Level: Active  Behavioral Response: Well GroomedAlertDepressed and Irritable  Type of Therapy: Individual Therapy  Treatment Goals addressed: Coping  Interventions: CBT, Solution Focused, Strength-based, Supportive and Reframing  Summary: [name] is a 36 y.o. female who presents with depressed mood and irritable affect. She reports some improvement in the degree of her depression since her last visit. She is upset and agitated over recent interactions with her step son and describes his behavior as disrespectful, lies to her and disregards his responsibilities. She endorses an internal struggle over her desire to "be done with him" verses continuing to take care of him. She processes her anger over his disregard for his own future and doesn't know how to help him. She does report improved motivation regarding exercise and has signed up to compete in a half iron man in January in [place] and finds this an excellent motivator. Her sleep and appetite are wnl.    Suicidal/Homicidal: Nowithout intent/plan  Therapist Response: Assessed patients current functioning and reviewed progress. Reviewed coping strategies. Assessed patients safety and assisted in identifying protective factors.  Reviewed crisis plan with patient. Assisted patient with the expression of her feelings of frustration . Processed and normalized patients anger. Reviewed healthy boundaries and where she can set firmer limits to protect herself. Her thinking is polarized at times. Used CBT to assist patient with the identification of negative distortions and irrational thoughts. Encouraged patient to verbalize alternative and factual responses which challenge thought distortions. Reviewed patients self care plan. Assessed  progress related to self care. Patient's self care is good. Recommend proper diet, regular exercise, socialization and recreation.    Plan: Return again in two weeks.  Diagnosis: Axis I: Depressive Disorder NOS    Axis II: No diagnosis    Lyric Rossano, LCSW 12/08/2011

## 2011-12-21 ENCOUNTER — Encounter (HOSPITAL_COMMUNITY): Payer: Self-pay | Admitting: Licensed Clinical Social Worker

## 2011-12-21 ENCOUNTER — Ambulatory Visit (INDEPENDENT_AMBULATORY_CARE_PROVIDER_SITE_OTHER): Payer: 59 | Admitting: Licensed Clinical Social Worker

## 2011-12-21 DIAGNOSIS — F329 Major depressive disorder, single episode, unspecified: Secondary | ICD-10-CM

## 2011-12-21 NOTE — Progress Notes (Signed)
   THERAPIST PROGRESS NOTE  Session Time: 9:30am-10:20am  Participation Level: Active  Behavioral Response: Well GroomedAlertDepressed  Type of Therapy: Individual Therapy  Treatment Goals addressed: Coping  Interventions: CBT, Solution Focused, Strength-based, Supportive and Reframing  Summary: [name] is a 36 y.o. female who presents with depressed mood and flat affect. She reports ongoing depression, with general disinterest. She is forcing herself to train and exercise even though she doesn't [name] to. She is not looking forward to the holidays and has anxiety about spending Thanksgiving with her husbands family because it is overwhelming for her. She is "down" about Christmas and generally finds this time of year difficult. She has followed through on pulling back the amount she does for her step son in response to his disrespect towards her. She endorses strong guilt, but she has been able to follow through. She reports frustration when having sex with her husband because she cannot reach an orgasm. She feels guilty for feeling disinterested in sex. She endorses negative self talk about her body image. Her sleep is poor and it is taking her longer to fall asleep. Her appetite is wnl.   Suicidal/Homicidal: Nowithout intent/plan  Therapist Response: Assessed patients current functioning and reviewed progress. Reviewed coping strategies. Assessed patients safety and assisted in identifying protective factors.  Reviewed crisis plan with patient. Assisted patient with the expression of her feelings of depression and anxiety, particularly around the holidays. Used CBT to assist patient with the identification of negative distortions and irrational thoughts. Encouraged patient to verbalize alternative and factual responses which challenge thought distortions. Patients thoughts are negative and distorted at times, related to her self image and self concept. Used DBT to practice  mindfulness, review distraction list and improve distress tolerance skills. Discussed solutions directly related to concerns about sex with husband and reviewed options for communication which would decreased her anxiety around sex. Reviewed patients self care plan. Assessed  progress related to self care. Patient's self care is good. Recommend proper diet, regular exercise, socialization and recreation.   Plan: Return again in two weeks.  Diagnosis: Axis I: Major Depression, Recurrent severe    Axis II: No diagnosis    Nocole Zammit, LCSW 12/21/2011

## 2012-01-05 ENCOUNTER — Ambulatory Visit (INDEPENDENT_AMBULATORY_CARE_PROVIDER_SITE_OTHER): Payer: 59 | Admitting: Licensed Clinical Social Worker

## 2012-01-05 DIAGNOSIS — F329 Major depressive disorder, single episode, unspecified: Secondary | ICD-10-CM

## 2012-01-05 NOTE — Progress Notes (Signed)
   THERAPIST PROGRESS NOTE  Session Time: 1:00pm-1:50pm  Participation Level: Active  Behavioral Response: Well GroomedAlertDepressed and Worthless  Type of Therapy: Individual Therapy  Treatment Goals addressed: Coping  Interventions: CBT, DBT, Motivational Interviewing, Strength-based, Supportive and Reframing  Summary: [name] is a 36 y.o. female who presents with depressed mood and flat affect. She reports ongoing depression, with crying spells, anxiety related to the holidays, isolation and poor motivation. She struggles to get herself going, but has been able to force herself to leave her house and do what needs to be done. She continues to exercise and train and is able to enjoy herself when doing this, but does not find enjoyment in much else. She does not like how her husbands family handles Christmas, with lots of gifts and high expectations of gift giving. She remains frustrated with her step son and his lack of responsibility and respect.her sleep has been disrupted and her appetite is increased, eating junk food   Suicidal/Homicidal: Nowithout intent/plan  Therapist Response: Assessed patients current functioning and reviewed progress. Reviewed coping strategies. Assessed patients safety and assisted in identifying protective factors.  Reviewed crisis plan with patient. Assisted patient with the expression of her feelings of depression and anxiety. Used CBT to assist patient with the identification of negative distortions and irrational thoughts. Encouraged patient to verbalize alternative and factual responses which challenge thought distortions. Patient struggles with strong feelings of guilt and self doubt when she chooses to take care of herself. She demonstrates difficulty tolerating anxiety related to saying no.  Used DBT to practice mindfulness, review distraction list and improve distress tolerance skills. Used motivational interviewing to assist and encourage  patient through the change process. Explored patients barriers to change. Reviewed patients self care plan. Assessed  progress related to self care. Patient's self care is good. Recommend proper diet, regular exercise, socialization and recreation. Reviewed healthy boundaries to help her manage the stress related to the Christmas holiday.   Plan: Return again in three weeks.  Diagnosis: Axis I: Major Depression, Recurrent severe    Axis II: No diagnosis    Telford Archambeau, LCSW 01/05/2012

## 2012-01-26 ENCOUNTER — Ambulatory Visit (INDEPENDENT_AMBULATORY_CARE_PROVIDER_SITE_OTHER): Payer: 59 | Admitting: Licensed Clinical Social Worker

## 2012-01-26 DIAGNOSIS — F329 Major depressive disorder, single episode, unspecified: Secondary | ICD-10-CM

## 2012-01-26 NOTE — Progress Notes (Signed)
   THERAPIST PROGRESS NOTE  Session Time: 1:00pm-1:50pm  Participation Level: Active  Behavioral Response: Well GroomedAlertDepressed  Type of Therapy: Individual Therapy  Treatment Goals addressed: Coping  Interventions: CBT, Solution Focused, Strength-based, Supportive and Reframing  Summary: [name] is a 37 y.o. female who presents with depressed mood and flat affect. She reports that her depression is at baseline since her last session. She was able to set some limits as discussed during her last session, which helped her through the holidays. She did not attend a Christmas party with her husband and is proud of herself for saying no. She did experience feelings of guilt, but was able to reframe her thinking through the experience. While visiting her husbands family, she allowed herself to take some "time outs" to help manage the stress. Her primary stressor right now is her step son. Her mood becomes negative as soon as he comes home. She wants to find a way to improve this. Her sleep and appetite are wnl.    Suicidal/Homicidal: Nowithout intent/plan  Therapist Response: Assessed patients current functioning and reviewed progress. Reviewed coping strategies. Assessed patients safety and assisted in identifying protective factors.  Reviewed crisis plan with patient. Assisted patient with the expression of her feelings of frustration with her step son. Used CBT to assist patient with the identification of negative distortions and irrational thoughts. Encouraged patient to verbalize alternative and factual responses which challenge thought distortions. Reviewed healthy boundaries and assertive communication. Patient is making progress related to self care goals. Her boundaries are becoming clearer. She continues to struggle with self doubt and blame. Reviewed patients self care plan. Assessed  progress related to self care. Patient's self care is good. Recommend proper diet, regular  exercise, socialization and recreation.   Plan: Return again in two weeks.  Diagnosis: Axis I: Depressive Disorder NOS    Axis II: No diagnosis    Ezriel Boffa, LCSW 01/26/2012

## 2012-02-02 ENCOUNTER — Ambulatory Visit (INDEPENDENT_AMBULATORY_CARE_PROVIDER_SITE_OTHER): Payer: 59 | Admitting: Psychiatry

## 2012-02-02 ENCOUNTER — Encounter (HOSPITAL_COMMUNITY): Payer: Self-pay | Admitting: Psychiatry

## 2012-02-02 VITALS — Wt 206.8 lb

## 2012-02-02 DIAGNOSIS — F329 Major depressive disorder, single episode, unspecified: Secondary | ICD-10-CM

## 2012-02-02 MED ORDER — TRAZODONE HCL 50 MG PO TABS: 50.0000 mg | ORAL_TABLET | Freq: Every day | ORAL | Status: DC

## 2012-02-02 MED ORDER — DULOXETINE HCL 30 MG PO CPEP: ORAL_CAPSULE | ORAL | Status: DC

## 2012-02-02 NOTE — Progress Notes (Signed)
Chief complaint Medication management and followup.    History presenting illness Patient is 37 year old Caucasian married female who came for her followup appointment.  Patient endorse she had a good Christmas and holidays.  She actually had a best holidays in recent years.  She did visit her in laws.  She's compliant with the medication and reported no side effects.  She has gained some weight in recent weeks due to lack of structure in her appetite.  However she is aware and trying to reduce weight.  She started training so she can participate in the event at the end of this month at [facility].  Patient is a runner.  She denies any recent crying spells or any social isolation.  Her energy level is better.  She is more social and denies any active or passive suicidal thoughts.  Her OB/GYN retired last year and now she is going to see a new doctor in the same practice.  She scheduled to have blood work in may.  She denies any mood swing agitation anger.  She denies any drinking or using any illegal substance.  Current psychiatric medication Trazodone 50 mg at bedtime Cymbalta 90 mg daily  Past psychiatric history Patient endorse history of depression since her teens.  She denies any history of suicidal attempt or any inpatient psychiatric treatment.  In the past she had tried Paxil and Lunesta with limited response.  Alcohol and drug history Patient denies any history of alcohol or illegal substance.  Medical history Patient has history of migraine headache.    Review of Systems  Constitutional: Negative for malaise/fatigue.  Neurological: Positive for headaches. Negative for weakness.  Psychiatric/Behavioral: Negative for hallucinations and substance abuse. The patient is nervous/anxious.    Mental status examination Patient is casually dressed and fairly groomed.  She maintained good eye contact.  Her speech is soft clear with normal tone and volume .  Her thought process is  logical linear and goal-directed.  She described her mood is good and her affect is improved from the past.   She denies any active or passive suicidal thinking or homicidal thinking. There no psychotic symptoms present.  She denies any auditory or visual hallucination .  Her attention and concentration is okay. She's alert and oriented x3. There are no shakes tremors or extrapyramidal side effects noted. Her insight judgment and impulse control is okay.  Assessment Axis I Maj. depressive disorder Axis II deferred Axis III history of migraine Axis IV mild to moderate  Plan I will continue her current psychiatric medication.  She will see therapist for coping and social skills.  Risk and benefit explain to the patient.  I recommend to call us if she has any question or concern or if he feel worsening of the symptom.  I will see her again in 3 months.  Portion of this note is generated with voice dictation software and may contain typographical error.

## 2012-02-10 ENCOUNTER — Ambulatory Visit (INDEPENDENT_AMBULATORY_CARE_PROVIDER_SITE_OTHER): Payer: 59 | Admitting: Licensed Clinical Social Worker

## 2012-02-10 DIAGNOSIS — F329 Major depressive disorder, single episode, unspecified: Secondary | ICD-10-CM

## 2012-02-10 NOTE — Progress Notes (Signed)
   THERAPIST PROGRESS NOTE  Session Time: 11:30am-12:20pm  Participation Level: Active  Behavioral Response: Well GroomedAlertAnxious and Depressed  Type of Therapy: Individual Therapy  Treatment Goals addressed: Coping  Interventions: CBT, Strength-based, Supportive and Reframing  Summary: [name] is a 37 y.o. female who presents with depressed mood and tearful affect. She is very upset because she just learned that a family friend had been killed in an accident. She processes her shock and grief, discusses her relationship with this man and her family. She reports having a "meltdown" over an intimate moment with her husband after he looked at her and told her she was beautiful. She reports crying and feeling as if a weight was put on her. She does not like attention focused on her and feels that it is wrong and prideful. She feels that she has been negatively affected by the teachings of her high school and fundamentalist beliefs. She processes her struggle to understand a God who loves her and feels that she is being watched and judged. She has trouble reframing her thoughts as she is uncertain of her religious understanding and beliefs. She continues to feel stress from her step son, but has taken a step back from him. She continues to train and will compete in a half iron man this weekend. Her sleep and appetite are wnl.    Suicidal/Homicidal: Nowithout intent/plan  Therapist Response: Assessed patients current functioning and reviewed progress. Reviewed coping strategies. Assessed patients safety and assisted in identifying protective factors.  Reviewed crisis plan with patient. Assisted patient with the expression of her feelings of depression and anxiety. Used CBT to assist patient with the identification of negative distortions and irrational thoughts. Encouraged patient to verbalize alternative and factual responses which challenge thought distortions. Patient demonstrates  strong negative self talk with fixed beliefs which are rigid. Reviewed patients self care plan. Assessed  progress related to self care. Patient's self care is good. Recommend proper diet, regular exercise, socialization and recreation.   Plan: Return again in two  weeks.  Diagnosis: Axis I: Depressive Disorder NOS    Axis II: No diagnosis    Davin Archuletta, LCSW 02/10/2012

## 2012-02-24 ENCOUNTER — Ambulatory Visit (INDEPENDENT_AMBULATORY_CARE_PROVIDER_SITE_OTHER): Payer: 59 | Admitting: Licensed Clinical Social Worker

## 2012-02-24 DIAGNOSIS — F329 Major depressive disorder, single episode, unspecified: Secondary | ICD-10-CM
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
## 2012-03-05 ENCOUNTER — Ambulatory Visit (INDEPENDENT_AMBULATORY_CARE_PROVIDER_SITE_OTHER): Payer: 59 | Admitting: Licensed Clinical Social Worker

## 2012-03-05 DIAGNOSIS — F329 Major depressive disorder, single episode, unspecified: Secondary | ICD-10-CM

## 2012-03-05 NOTE — Progress Notes (Signed)
   THERAPIST PROGRESS NOTE  Session Time: 2:00pm-2:50pm  Participation Level: Active  Behavioral Response: Well GroomedAlertAnxious and Depressed  Type of Therapy: Individual Therapy  Treatment Goals addressed: Coping  Interventions: CBT, Strength-based, Supportive and Reframing  Summary: [name] is a 37 y.o. female who presents with depressed mood and anxious affect. She reports feeling flooded with traumatic memories following her last session. She endorses nightmares about hell waking her up and real fear about going to hell. She discusses her experience at [facility] and feels that she was brain washed into beliefs which she knows are inaccurate and wrong. She struggles with self doubt, feelings of insecurity and self hatred. She showed this Clinical research associate a page in her journal with an old drawing of self hatred. She feels embarrassed that "a little school thing" is what is causing her problems. Her appetite is wnl.    Suicidal/Homicidal: Nowithout intent/plan  Therapist Response: Assessed patients current functioning and reviewed progress. Reviewed coping strategies. Assessed patients safety and assisted in identifying protective factors.  Reviewed crisis plan with patient. Assisted patient with the expression of her feelings of sadness and anxiety. Educated patient on trauma and containment. Used CBT to assist patient with the identification of negative distortions and irrational thoughts. Encouraged patient to verbalize alternative and factual responses which challenge thought distortions. Used DBT to practice mindfulness, review distraction list and improve distress tolerance skills. Reviewed patients self care plan. Assessed  progress related to self care. Patient's self care is good. Recommend proper diet, regular exercise, socialization and recreation.   Plan: Return again in one weeks.  Diagnosis: Axis I: Depressive Disorder NOS    Axis II: No  diagnosis    Naraya Stoneberg, LCSW 03/05/2012

## 2012-03-10 ENCOUNTER — Other Ambulatory Visit: Payer: Self-pay

## 2012-03-16 ENCOUNTER — Ambulatory Visit (INDEPENDENT_AMBULATORY_CARE_PROVIDER_SITE_OTHER): Payer: 59 | Admitting: Licensed Clinical Social Worker

## 2012-03-16 DIAGNOSIS — F329 Major depressive disorder, single episode, unspecified: Secondary | ICD-10-CM

## 2012-03-16 NOTE — Progress Notes (Signed)
   THERAPIST PROGRESS NOTE  Session Time: 11:30am-12:20pm  Participation Level: Active  Behavioral Response: Well GroomedAlertAnxious and Depressed  Type of Therapy: Individual Therapy  Treatment Goals addressed: Coping  Interventions: CBT, DBT, Strength-based, Supportive and Reframing  Summary: [name] is a 37 y.o. female who presents with depressed mood and tearful affect. She reports worsening feelings of depression, nightmares, feelings of worthlessness and hopelessness about her life. She is frustrated about how this trauma has affected her. She believes that she should not be struggling with these feelings because she minimizes the significance of the trauma. She is tearful throughout the session. She feels a lack of centering in her life. She wants to have her faith become a part of her life again. She realizes that she is uncertain about this part of her identity and that this causes her to have increased anxiety.    Suicidal/Homicidal: Nowithout intent/plan  Therapist Response: Assessed patients current functioning and reviewed progress. Reviewed coping strategies. Assessed patients safety and assisted in identifying protective factors.  Reviewed crisis plan with patient. Assisted patient with the expression of her feelings of frustration. Normalized patients grief reaction to her trauma Used CBT to assist patient with the identification of negative distortions and irrational thoughts. Encouraged patient to verbalize alternative and factual responses which challenge thought distortions. Used DBT to practice mindfulness, review distraction list and improve distress tolerance skills. Reviewed patients self care plan. Assessed  progress related to self care. Patient's self care is good. Recommend proper diet, regular exercise, socialization and recreation.   Plan: Return again in one weeks.  Diagnosis: Axis I: Depressive Disorder NOS    Axis II: No  diagnosis    Othmar Ringer, LCSW 03/16/2012

## 2012-04-03 ENCOUNTER — Ambulatory Visit (INDEPENDENT_AMBULATORY_CARE_PROVIDER_SITE_OTHER): Payer: 59 | Admitting: Licensed Clinical Social Worker

## 2012-04-03 DIAGNOSIS — F329 Major depressive disorder, single episode, unspecified: Secondary | ICD-10-CM

## 2012-04-03 DIAGNOSIS — F32A Depression, unspecified: Secondary | ICD-10-CM

## 2012-04-03 NOTE — Progress Notes (Signed)
   THERAPIST PROGRESS NOTE  Session Time: 2:00pm-2:50pm  Participation Level: Active  Behavioral Response: Well GroomedAlertAnxious and Depressed  Type of Therapy: Individual Therapy  Treatment Goals addressed: Coping  Interventions: CBT, Solution Focused, Strength-based, Supportive and Reframing  Summary: [name] is a 37 y.o. female who presents with depressed mood and flat affect. She reports a very stressful few weeks since she has been here. She reports that a good friend of hers died un expectantly, her father in law had a heart attack and she has been overwhelmed taking care of her step son. At her friends funeral, there was an Estate manager/land agent call and patient reports leaving due to a severe panic attack. She is angry that she was unable to say good bye to her friend the way she needed and wanted. She is tearful about his death, angry and confused because he was very young and in excellent physical shape. She processes her confusion over his sudden heart attack. She endorses fatigue related to all the above stressors and has been trying to redirect negative thoughts when she has them. Her sleep remains poor and her appetite is wnl. She will talk to Dr. Lolly Mustache about her sleep at her next visit.    Suicidal/Homicidal: Nowithout intent/plan  Therapist Response: Assessed patients current functioning and reviewed progress. Reviewed coping strategies. Assessed patients safety and assisted in identifying protective factors.  Reviewed crisis plan with patient. Assisted patient with the expression of grief, anxiety and anger. Reviewed patients self care plan. Assessed progress related to self care. Patients self care is good. Recommend daily exercise, increased socialization and recreation. Used CBT to assist patient with the identification of negative distortions and irrational thoughts. Encouraged patient to verbalize alternative and factual responses which challenge thought distortions. Reviewed  healthy boundaries and assertive communication. Processed and normalized patients grief reaction.   Plan: Return again in one weeks.  Diagnosis: Axis I: Depressive Disorder NOS    Axis II: No diagnosis    NORDEN,KRISTIN, LCSW 04/03/2012

## 2012-04-16 ENCOUNTER — Ambulatory Visit (INDEPENDENT_AMBULATORY_CARE_PROVIDER_SITE_OTHER): Payer: 59 | Admitting: Licensed Clinical Social Worker

## 2012-04-16 DIAGNOSIS — F329 Major depressive disorder, single episode, unspecified: Secondary | ICD-10-CM

## 2012-04-16 NOTE — Progress Notes (Signed)
   THERAPIST PROGRESS NOTE  Session Time: 2:00pm-2:50pm  Participation Level: Active  Behavioral Response: Well GroomedAlertDepressed  Type of Therapy: Individual Therapy  Treatment Goals addressed: Coping  Interventions: CBT, DBT, Strength-based, Supportive and Reframing  Summary: [name] is a 37 y.o. female who presents with depressed mood and flat affect. She reports that her depression is just below a good baseline mood for her and that she is experiencing improvement. She continues to struggle to maintain motivation for training, but is working on keeping small goals set so she can attain them. She is frustrated with her body and her weight and admits to eating emotionally. She struggles with negative self talk around her body image. She feels that it is unfair that her husband gets taken away from spending time with her due to his call schedule. She struggles with the inconsistency of this and likes routine. Her sleep has improved.    Suicidal/Homicidal: Nowithout intent/plan  Therapist Response: Assessed patients current functioning and reviewed progress. Reviewed coping strategies. Assessed patients safety and assisted in identifying protective factors.  Reviewed crisis plan with patient. Assisted patient with the expression of depression and frustration. Reviewed patients self care plan. Assessed progress related to self care. Patients self care is very good. Recommend daily exercise, increased socialization and recreation. Used CBT to assist patient with the identification of negative distortions and irrational thoughts. Encouraged patient to verbalize alternative and factual responses which challenge thought distortions. Used DBT to practice mindfulness, review distraction list and improve distress tolerance skills. Used motivational interviewing to assist and encourage patient through the change process. Explored patients barriers to change.   Plan: Return again in one  weeks.  Diagnosis: Axis I: Depressive Disorder NOS    Axis II: No diagnosis    Yolanda Duncanson, LCSW 04/16/2012

## 2012-04-25 ENCOUNTER — Ambulatory Visit (INDEPENDENT_AMBULATORY_CARE_PROVIDER_SITE_OTHER): Payer: 59 | Admitting: Licensed Clinical Social Worker

## 2012-04-25 DIAGNOSIS — F329 Major depressive disorder, single episode, unspecified: Secondary | ICD-10-CM

## 2012-04-25 NOTE — Progress Notes (Signed)
   THERAPIST PROGRESS NOTE  Session Time: 1:00pm-1:50pm  Participation Level: Active  Behavioral Response: Well GroomedAlertDepressed  Type of Therapy: Individual Therapy  Treatment Goals addressed: Coping  Interventions: CBT, Strength-based, Supportive and Reframing  Summary: [name] is a 37 y.o. female who presents with depressed mood and flat affect. She reports feeling some improvement in her mood since the weather change and notices that the more exposure to light and warmth she experiences, the better she feels. She attended another funeral of a colleague whose son committed suicide. She processes this experience and how it made her think about her depression as a teenager and how she wished she had received treatment back then. She explores the things she could have done in her life if she didn't struggle with depression, such as play basketball for [employer]. She reports two occasions where she could have reacted negatively, but choose a different response, each involving her self esteem. She is proud of herself for this progress, but still minimizes the significance of this change.   Suicidal/Homicidal: Nowithout intent/plan  Therapist Response: Assessed patients current functioning and reviewed progress. Reviewed coping strategies. Assessed patients safety and assisted in identifying protective factors.  Reviewed crisis plan with patient. Assisted patient with the expression of frustration . Reviewed patients self care plan. Assessed progress related to self care. Patients self care is good. Recommend daily exercise, increased socialization and recreation. Used CBT to assist patient with the identification of negative distortions and irrational thoughts. Encouraged patient to verbalize alternative and factual responses which challenge thought distortions. Reviewed healthy boundaries and assertive communication. Used DBT to practice mindfulness, review distraction list and  improve distress tolerance skills. Used motivational interviewing to assist and encourage patient through the change process. Explored patients barriers to change. Patient is struggling to develop distress tolerance skills around her emotional eating.she plans to see a nutriontist this week.   Plan: Return again in one weeks.  Diagnosis: Axis I: Depressive Disorder NOS    Axis II: No diagnosis    Ilisa Hayworth, LCSW 04/25/2012

## 2012-04-30 ENCOUNTER — Other Ambulatory Visit (HOSPITAL_COMMUNITY): Payer: Self-pay | Admitting: *Deleted

## 2012-04-30 DIAGNOSIS — F329 Major depressive disorder, single episode, unspecified: Secondary | ICD-10-CM

## 2012-05-02 ENCOUNTER — Encounter (HOSPITAL_COMMUNITY): Payer: Self-pay | Admitting: Psychiatry

## 2012-05-02 ENCOUNTER — Ambulatory Visit (INDEPENDENT_AMBULATORY_CARE_PROVIDER_SITE_OTHER): Payer: 59 | Admitting: Psychiatry

## 2012-05-02 VITALS — BP 141/87 | HR 74 | Wt 206.2 lb

## 2012-05-02 DIAGNOSIS — F329 Major depressive disorder, single episode, unspecified: Secondary | ICD-10-CM

## 2012-05-02 MED ORDER — DULOXETINE HCL 30 MG PO CPEP: ORAL_CAPSULE | ORAL | Status: DC

## 2012-05-02 MED ORDER — TRAZODONE HCL 50 MG PO TABS: 50.0000 mg | ORAL_TABLET | Freq: Every day | ORAL | Status: DC

## 2012-05-02 NOTE — Progress Notes (Signed)
Chief complaint Medication management and followup.    History presenting illness Patient is 37 year old Caucasian married female who came for her followup appointment.  She is compliant with her medication.  She denies any crying spells in recent weeks.  She is seeing Baxter Hire regularly for coping and social skills.  She had a side effects of medication.  She is sleeping better.  Her appetite and weight has been stable.  She feels her current medicine working very well. She scheduled to have blood work in may.  She denies any mood swing agitation anger.  She denies any drinking or using any illegal substance.  Current psychiatric medication Trazodone 50 mg at bedtime Cymbalta 90 mg daily  Past psychiatric history Patient endorse history of depression since her teens.  She denies any history of suicidal attempt or any inpatient psychiatric treatment.  In the past she had tried Paxil and Lunesta with limited response.  Alcohol and drug history Patient denies any history of alcohol or illegal substance.  Medical history Patient has history of migraine headache.    Review of Systems  Constitutional: Negative for malaise/fatigue.  Neurological: Positive for headaches. Negative for weakness.  Psychiatric/Behavioral: Negative for hallucinations and substance abuse. The patient is nervous/anxious.    Mental status examination Patient is casually dressed and fairly groomed.  She maintained good eye contact.  Her speech is soft clear with normal tone and volume .  Her thought process is logical linear and goal-directed.  She described her mood is good and her affect is improved from the past.   She denies any active or passive suicidal thinking or homicidal thinking. There no psychotic symptoms present.  She denies any auditory or visual hallucination .  Her attention and concentration is okay. She's alert and oriented x3. There are no shakes tremors or extrapyramidal side effects noted. Her insight  judgment and impulse control is okay.  Assessment Axis I Maj. depressive disorder Axis II deferred Axis III history of migraine Axis IV mild to moderate  Plan I will continue her current psychiatric medication.  She likes to refill her trazodone at [facility] and her Cymbalta at [facility]  Risk and benefit explain to the patient.  I recommend to call us if she has any question or concern or if he feel worsening of the symptom.  I will see her again in 3 months.  Patient is scheduled to have blood work in May.  Portion of this note is generated with voice dictation software and may contain typographical error.

## 2012-05-09 ENCOUNTER — Ambulatory Visit (HOSPITAL_COMMUNITY): Payer: Self-pay | Admitting: Licensed Clinical Social Worker

## 2012-05-10 ENCOUNTER — Ambulatory Visit (INDEPENDENT_AMBULATORY_CARE_PROVIDER_SITE_OTHER): Payer: 59 | Admitting: Licensed Clinical Social Worker

## 2012-05-10 DIAGNOSIS — F329 Major depressive disorder, single episode, unspecified: Secondary | ICD-10-CM

## 2012-05-10 NOTE — Progress Notes (Signed)
   THERAPIST PROGRESS NOTE  Session Time: 1:00pm-1:50pm  Participation Level: Active  Behavioral Response: Well GroomedAlertDepressed  Type of Therapy: Individual Therapy  Treatment Goals addressed: Coping  Interventions: CBT, Strength-based, Supportive and Reframing  Summary: [name] is a 37 y.o. female who presents with euthymic mood and affect. She reports that she is feeling well and is afraid to feel well and enjoy herself. She is fearful that something bad is going to happen and believes that she is not supposed to enjoy happiness when she experiences it. She processes her continued struggle with cognitive distortions surrounding her body image and religion. She endorses some improvement in her body image after meeting with a nutritionist and learning more about her body. She explores her sadness around being exposed to such negative, damaging and inaccurate beliefs at a young age. She likens her school experience to a cult. She is sleeping and eating well.   Suicidal/Homicidal: Nowithout intent/plan  Therapist Response: Assessed patients current functioning and reviewed progress. Reviewed coping strategies. Assessed patients safety and assisted in identifying protective factors.  Reviewed crisis plan with patient. Assisted patient with the expression of frustration . Reviewed patients self care plan. Assessed progress related to self care. 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 Recommend daily exercise, increased socialization and recreation. Used CBT to assist patient with the identification of negative distortions and irrational thoughts. Encouraged patient to verbalize alternative and factual responses which challenge thought distortions. Processed and normalized patients grief reaction.   Plan: Return again in one weeks.  Diagnosis: Axis I: Depressive Disorder NOS    Axis II: No diagnosis    Kendon Sedeno, LCSW 05/10/2012

## 2012-05-16 ENCOUNTER — Ambulatory Visit (INDEPENDENT_AMBULATORY_CARE_PROVIDER_SITE_OTHER): Payer: 59 | Admitting: Licensed Clinical Social Worker

## 2012-05-16 DIAGNOSIS — F32A Depression, unspecified: Secondary | ICD-10-CM

## 2012-05-16 DIAGNOSIS — F329 Major depressive disorder, single episode, unspecified: Secondary | ICD-10-CM
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
## 2012-06-05 ENCOUNTER — Ambulatory Visit (INDEPENDENT_AMBULATORY_CARE_PROVIDER_SITE_OTHER): Payer: 59 | Admitting: Licensed Clinical Social Worker

## 2012-06-05 DIAGNOSIS — F329 Major depressive disorder, single episode, unspecified: Secondary | ICD-10-CM
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
## 2012-06-19 ENCOUNTER — Ambulatory Visit (INDEPENDENT_AMBULATORY_CARE_PROVIDER_SITE_OTHER): Payer: 59 | Admitting: Licensed Clinical Social Worker

## 2012-06-19 DIAGNOSIS — F329 Major depressive disorder, single episode, unspecified: Secondary | ICD-10-CM
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
## 2012-07-03 ENCOUNTER — Ambulatory Visit (INDEPENDENT_AMBULATORY_CARE_PROVIDER_SITE_OTHER): Payer: 59 | Admitting: Licensed Clinical Social Worker

## 2012-07-03 DIAGNOSIS — F329 Major depressive disorder, single episode, unspecified: Secondary | ICD-10-CM

## 2012-07-03 NOTE — Progress Notes (Signed)
   THERAPIST PROGRESS NOTE  Session Time: 1:00pm-1:50pm  Participation Level: Active  Behavioral Response: Well GroomedAlertEuthymic  Type of Therapy: Individual Therapy  Treatment Goals addressed: Coping  Interventions: CBT, Strength-based, Supportive and Reframing  Summary: [name] is a 37 y.o. female who presents with euthymic mood and bright affect. She is doing well and brought in her medal for completing a half iron man. She was very pleased with her time and was able to feel proud of herself without guilt. She is currently very frustrated with her step son and his lack of motivation and planning for his future. She questions how best to manage this relationship when she often feels guilty for being frustrated and feels helpless. Her self care is very good and she has now begun training for the [facility] marathon. Her sleep and appetite are wnl.    Suicidal/Homicidal: Nowithout intent/plan  Therapist Response: Assessed patients current functioning and reviewed progress. Reviewed coping strategies. Assessed patients safety and assisted in identifying protective factors.  Reviewed crisis plan with patient. Assisted patient with the expression of frustration. Reviewed patients self care plan. Assessed progress related to self care. Patients self care is excellent. Recommend daily exercise, increased socialization and recreation. Used CBT to assist patient with the identification of negative distortions and irrational thoughts. Encouraged patient to verbalize alternative and factual responses which challenge thought distortions. Reviewed healthy boundaries and assertive communication.   Plan: Return again in one weeks.  Diagnosis: Axis I: Depressive Disorder NOS    Axis II: No diagnosis    Blannie Shedlock, LCSW 07/03/2012

## 2012-07-17 ENCOUNTER — Ambulatory Visit (INDEPENDENT_AMBULATORY_CARE_PROVIDER_SITE_OTHER): Payer: 59 | Admitting: Licensed Clinical Social Worker

## 2012-07-17 DIAGNOSIS — F329 Major depressive disorder, single episode, unspecified: Secondary | ICD-10-CM

## 2012-07-17 NOTE — Progress Notes (Signed)
   THERAPIST PROGRESS NOTE  Session Time: 1:00pm-1:50pm  Participation Level: Active  Behavioral Response: Well GroomedAlertAnxious and Depressed  Type of Therapy: Individual Therapy  Treatment Goals addressed: Coping  Interventions: CBT, Motivational Interviewing, Strength-based, Assertiveness Training, Supportive and Reframing  Summary: [name] is a 37 y.o. female who presents with depressed mood and flat affect. She reports an increase in feelings of depression. She reports poor motivation, but she has been forcing herself to do her daily tasks. She is afraid that if she stops forcing herself, she will fall further into a deep depression and will not get out of bed. She is binge eating to manage her feelings. Her sleep has increased. She is frustrated with her step son and his poor attitude. She is upset for her husband and how much he is trying to help his son. She feels taken advantage of, but does not address her frustration because she is fearful of conflict. She is instead avoiding her step son.    Suicidal/Homicidal: Nowithout intent/plan  Therapist Response: Assessed patients current functioning and reviewed progress. Reviewed coping strategies. Assessed patients safety and assisted in identifying protective factors.  Reviewed crisis plan with patient. Assisted patient with the expression of frustration. Reviewed patients self care plan. Assessed progress related to self care. Patients self care is good. Recommend daily exercise, increased socialization and recreation. Used CBT to assist patient with the identification of negative distortions and irrational thoughts. Encouraged patient to verbalize alternative and factual responses which challenge thought distortions. Reviewed healthy boundaries and assertive communication. Used motivational interviewing to assist and encourage patient through the change process. Explored patients barriers to change.   Plan: Return again in  one weeks.  Diagnosis: Axis I: Depressive Disorder NOS    Axis II: No diagnosis    Raelyn Racette, LCSW 07/17/2012

## 2012-07-23 ENCOUNTER — Other Ambulatory Visit (HOSPITAL_COMMUNITY): Payer: Self-pay | Admitting: *Deleted

## 2012-07-23 NOTE — Telephone Encounter (Signed)
See note at phone call.

## 2012-07-24 ENCOUNTER — Other Ambulatory Visit (HOSPITAL_COMMUNITY): Payer: Self-pay | Admitting: *Deleted

## 2012-07-24 DIAGNOSIS — F329 Major depressive disorder, single episode, unspecified: Secondary | ICD-10-CM

## 2012-07-24 MED ORDER — TRAZODONE HCL 50 MG PO TABS: 50.0000 mg | ORAL_TABLET | Freq: Every day | ORAL | Status: DC

## 2012-07-25 ENCOUNTER — Encounter: Payer: Self-pay | Admitting: Gynecology

## 2012-08-01 ENCOUNTER — Encounter (HOSPITAL_COMMUNITY): Payer: Self-pay | Admitting: Psychiatry

## 2012-08-01 ENCOUNTER — Ambulatory Visit (INDEPENDENT_AMBULATORY_CARE_PROVIDER_SITE_OTHER): Payer: 59 | Admitting: Psychiatry

## 2012-08-01 VITALS — BP 126/79 | HR 93 | Ht 68.0 in | Wt 213.2 lb

## 2012-08-01 DIAGNOSIS — F329 Major depressive disorder, single episode, unspecified: Secondary | ICD-10-CM

## 2012-08-01 MED ORDER — TRAZODONE HCL 50 MG PO TABS: 50.0000 mg | ORAL_TABLET | Freq: Every day | ORAL | Status: DC

## 2012-08-01 MED ORDER — DULOXETINE HCL 30 MG PO CPEP: ORAL_CAPSULE | ORAL | Status: DC

## 2012-08-01 NOTE — Progress Notes (Signed)
Chief complaint Medication management and followup.    History presenting illness Patient is 37 year old Caucasian married female who came for her followup appointment.  She is compliant with her medication.  Patient endorse sometimes she feels sad but decreased energy .  Patient admitted recently having crying spells but realized that it does happen with her chronic illness.  Patient denies any suicidal thought.  She is seeing therapist regularly.  She does not feel that she needs a medication change.  She's active.  She denies any mood swing anger or irritability.  She sleeping better with trazodone.  She denied any side effects.  She's not drinking or using any illegal substance.  Current psychiatric medication Trazodone 50 mg at bedtime Cymbalta 90 mg daily  Past psychiatric history Patient endorse history of depression since her teens.  She denies any history of suicidal attempt or any inpatient psychiatric treatment.  In the past she had tried Paxil and Lunesta with limited response.  Alcohol and drug history Patient denies any history of alcohol or illegal substance.  Medical history Patient has history of migraine headache.    Review of Systems  Constitutional: Negative for malaise/fatigue.  Neurological: Positive for headaches. Negative for weakness.  Psychiatric/Behavioral: Negative for hallucinations and substance abuse. The patient is nervous/anxious.    Filed Vitals:   08/01/12 1313  BP: 126/79  Pulse: 93   Mental status examination Patient is casually dressed and fairly groomed.  She maintained fair eye contact.  Her speech is soft clear with normal tone and volume .  Her thought process is logical linear and goal-directed.  She described her mood is tired and her affect is mood appropriate.   She denies any active or passive suicidal thinking or homicidal thinking. There no psychotic symptoms present.  She denies any auditory or visual hallucination .  Her attention and  concentration is okay. She's alert and oriented x3. There are no shakes tremors or extrapyramidal side effects noted. Her insight judgment and impulse control is okay.  Assessment Axis I Maj. depressive disorder Axis II deferred Axis III history of migraine Axis IV mild to moderate  Plan Despite feeling sad patient does not want to change her current psychiatric medication.  She is seeing therapist regularly.  She's comfortable with her current medication.  I will continue trazodone and Cymbalta at present dose.  I recommend if she feels worsening of the symptom or anytime having active suicidal thoughts or homicidal thoughts continue to call 911 or go to local emergency room.  I will see her again in 3 months.  Portion of this note is generated with voice dictation software and may contain typographical error.

## 2012-08-02 ENCOUNTER — Ambulatory Visit (INDEPENDENT_AMBULATORY_CARE_PROVIDER_SITE_OTHER): Payer: 59 | Admitting: Licensed Clinical Social Worker

## 2012-08-02 DIAGNOSIS — F329 Major depressive disorder, single episode, unspecified: Secondary | ICD-10-CM

## 2012-08-02 NOTE — Progress Notes (Signed)
   THERAPIST PROGRESS NOTE  Session Time: 10:30am-11:20am  Participation Level: Active  Behavioral Response: Well GroomedLethargicDepressed  Type of Therapy: Individual Therapy  Treatment Goals addressed: Coping  Interventions: CBT, Motivational Interviewing, Strength-based, Supportive and Reframing  Summary: [name] is a 37 y.o. female who presents with depressed mood and flat affect. She reports continued feelings of depression and anxiety. She endorses crying spells, increased appetite with food binges and sleep wnl. She expresses frustration about her step son and his ongoing behavior of disrespect. She finds herself struggling with anticipatory anxiety, preparing for arguments with him in her head. She despises conflict with him, but does not want to ask her husband to intervene. She has a vacation coming up and has difficulty being excited about this.    Suicidal/Homicidal: Nowithout intent/plan  Therapist Response: Assessed patients current functioning and reviewed progress. Reviewed coping strategies. Assessed patients safety and assisted in identifying protective factors.  Reviewed crisis plan with patient. Assisted patient with the expression of frustration and sadness. Reviewed patients self care plan. Assessed progress related to self care. Patients self care is fair. Recommend daily exercise, increased socialization and recreation. Reviewed healthy boundaries and assertive communication. Used CBT to assist patient with the identification of negative distortions and irrational thoughts. Encouraged patient to verbalize alternative and factual responses which challenge thought distortions. Used motivational interviewing to assist and encourage patient through the change process. Explored patients barriers to change. Recommend patient ask her husband for assistance with her step son.   Plan: Return again in one weeks.  Diagnosis: Axis I: Major Depression, Recurrent  severe    Axis II: No diagnosis    Harlean Regula, LCSW 08/02/2012

## 2012-08-09 ENCOUNTER — Ambulatory Visit (INDEPENDENT_AMBULATORY_CARE_PROVIDER_SITE_OTHER): Payer: 59 | Admitting: Licensed Clinical Social Worker

## 2012-08-09 DIAGNOSIS — F329 Major depressive disorder, single episode, unspecified: Secondary | ICD-10-CM

## 2012-08-09 NOTE — Progress Notes (Signed)
   THERAPIST PROGRESS NOTE  Session Time: 2:00pm-2:50pm  Participation Level: Active  Behavioral Response: Well GroomedAlertAnxious, Depressed and Worthless  Type of Therapy: Individual Therapy  Treatment Goals addressed: Coping  Interventions: CBT, DBT, Strength-based, Supportive and Reframing  Summary: [name] is a 37 y.o. female who presents with depressed mood and flat affect. She is tearful throughout the session and reports increasing depression and strong negative self talk. She expresses frustration with herself and is unable to identify anything she does well in life. She criticizes herself constantly and when she tries to reframe her thinking, she is challenged because she does not believe any of it to be true. She did follow through and talk to her husband and ask him for help. She continues to struggle accepting her depression as a disease and not as a character flaw. Her sleep has increased and her appetite is wnl.    Suicidal/Homicidal: Nowithout intent/plan  Therapist Response: Assessed patients current functioning and reviewed progress. Reviewed coping strategies. Assessed patients safety and assisted in identifying protective factors.  Reviewed crisis plan with patient. Assisted patient with the expression of sadness. Reviewed patients self care plan. Assessed progress related to self care. Patients self care is good. Recommend daily exercise, increased socialization and recreation. Used CBT to assist patient with the identification of negative distortions and irrational thoughts. Encouraged patient to verbalize alternative and factual responses which challenge thought distortions. Used motivational interviewing to assist and encourage patient through the change process. Explored patients barriers to change.   Plan: Return again in one weeks.  Diagnosis: Axis I: Major Depression, Recurrent severe    Axis II: No diagnosis    Jourdin Connors, LCSW 08/09/2012

## 2012-08-14 ENCOUNTER — Ambulatory Visit (INDEPENDENT_AMBULATORY_CARE_PROVIDER_SITE_OTHER): Payer: 59 | Admitting: Licensed Clinical Social Worker

## 2012-08-14 DIAGNOSIS — F329 Major depressive disorder, single episode, unspecified: Secondary | ICD-10-CM

## 2012-08-14 NOTE — Progress Notes (Signed)
   THERAPIST PROGRESS NOTE  Session Time: 10:30am-11:20am  Participation Level: Active  Behavioral Response: Well GroomedAlertAnxious and Depressed  Type of Therapy: Individual Therapy  Treatment Goals addressed: Coping  Interventions: CBT, Motivational Interviewing, Strength-based, Supportive and Reframing  Summary: [name] is a 37 y.o. female who presents with depressed mood and anxious affect. She had an enjoyable weekend with her husband out of town and is pleased that she was able to enjoy herself. She did purchase the CBT workbook for depression and has been working in it. She processes how emotionally exhausting this work is and how she is challenged by cognitive restructuring because she does not believe her reframing thoughts. She processes her frustration that she is unable to enjoy bike riding with her husband because she is competitive. She wants to be able to appreciate good things in her life. She is challenged by how her husband loves her. Her sleep and appetite are wnl.   Suicidal/Homicidal: Nowithout intent/plan  Therapist Response: Assessed patients current functioning and reviewed progress. Reviewed coping strategies. Assessed patients safety and assisted in identifying protective factors.  Reviewed crisis plan with patient. Assisted patient with the expression of frustration. Reviewed patients self care plan. Assessed progress related to self care. Patients self care is good. Recommend daily exercise, increased socialization and recreation. Used CBT to assist patient with the identification of negative distortions and irrational thoughts. Encouraged patient to verbalize alternative and factual responses which challenge thought distortions. Used motivational interviewing to assist and encourage patient through the change process. Explored patients barriers to change. Used DBT to practice mindfulness, review distraction list and improve distress tolerance skills.    Plan: Return again in one weeks.  Diagnosis: Axis I: Major Depression, Recurrent severe    Axis II: No diagnosis    Shykeem Resurreccion, LCSW 08/14/2012

## 2012-08-23 ENCOUNTER — Encounter: Payer: Self-pay | Admitting: Gynecology

## 2012-08-23 ENCOUNTER — Ambulatory Visit (INDEPENDENT_AMBULATORY_CARE_PROVIDER_SITE_OTHER): Payer: 59 | Admitting: Licensed Clinical Social Worker

## 2012-08-23 DIAGNOSIS — F329 Major depressive disorder, single episode, unspecified: Secondary | ICD-10-CM

## 2012-08-23 NOTE — Progress Notes (Signed)
   THERAPIST PROGRESS NOTE  Session Time: 1:00pm-1:50pm  Participation Level: Active  Behavioral Response: Well GroomedAlertDepressed and Worthless  Type of Therapy: Individual Therapy  Treatment Goals addressed: Coping  Interventions: CBT, DBT, Strength-based, Supportive and Reframing  Summary: [name] is a 36 y.o. female who presents with depressed mood and tearful affect. She reports having a terrible week, with increased depression, crying daily and strong negative self talk. She is frustrated with herself and reports that she has not felt this depressed in three years. She processes her feelings of worthlessness and reports that while she was working in the [employer], she felt overwhelmed by sadness when she tried to answer a question about why she tries to get better. She found herself unable to answer the question and is tearful while talking about this. She leaves for vacation tomorrow and is anxious about traveling over seas. Her sleep and appetite are nwl.   Suicidal/Homicidal: Nowithout intent/plan  Therapist Response: Assessed patients current functioning and reviewed progress. Reviewed coping strategies. Assessed patients safety and assisted in identifying protective factors.  Reviewed crisis plan with patient. Assisted patient with the expression of sadness. Reviewed patients self care plan. Assessed progress related to self care. Patients self care is good. Recommend daily exercise, increased socialization and recreation. Reviewed healthy boundaries and assertive communication. Used CBT to assist patient with the identification of negative distortions and irrational thoughts. Encouraged patient to verbalize alternative and factual responses which challenge thought distortions. Used DBT to practice mindfulness, review distraction list and improve distress tolerance skills.   Plan: Return again in three weeks.  Diagnosis: Axis I: Major Depression, Recurrent  severe    Axis II: No diagnosis    Rocco Kerkhoff, LCSW 08/23/2012

## 2012-09-11 ENCOUNTER — Ambulatory Visit (INDEPENDENT_AMBULATORY_CARE_PROVIDER_SITE_OTHER): Payer: 59 | Admitting: Licensed Clinical Social Worker

## 2012-09-11 ENCOUNTER — Other Ambulatory Visit: Payer: Self-pay | Admitting: Obstetrics and Gynecology

## 2012-09-11 DIAGNOSIS — F329 Major depressive disorder, single episode, unspecified: Secondary | ICD-10-CM

## 2012-09-11 NOTE — Progress Notes (Signed)
   THERAPIST PROGRESS NOTE  Session Time: 1:00pm-1:50pm  Participation Level: Active  Behavioral Response: Well GroomedLethargicAnxious and Depressed  Type of Therapy: Individual Therapy  Treatment Goals addressed: Coping  Interventions: CBT, DBT, Strength-based, Supportive and Reframing  Summary: [name] is a 37 y.o. female who presents with depressed mood and flat affect. She has just returned from a vacation to [place] and discusses her experience. She is pleased that she was able to have a good time but did experience a few episodes of anxiety related to her appearance. She was able use CBT skills to reframe her negative thoughts and was able to choose an alternative option which supported her well being. She is now looking to develop an exercise routine again but does not want to become too focused on this as a means of self hatred. Overall, she is doing well, sleeping and eating well.   Suicidal/Homicidal: Nowithout intent/plan  Therapist Response: Assessed patients current functioning and reviewed progress. Reviewed coping strategies. Assessed patients safety and assisted in identifying protective factors.  Reviewed crisis plan with patient. Assisted patient with the expression of frustration. Reviewed patients self care plan. Assessed progress related to self care. Patients self care is good. Recommend daily exercise, increased socialization and recreation. Used CBT to assist patient with the identification of negative distortions and irrational thoughts. Encouraged patient to verbalize alternative and factual responses which challenge thought distortions. Used DBT to practice mindfulness, review distraction list and improve distress tolerance skills. Used motivational interviewing to assist and encourage patient through the change process. Explored patients barriers to change. Reviewed healthy boundaries and assertive communication.   Plan: Return again in one  weeks.  Diagnosis: Axis I: Depressive Disorder NOS    Axis II: No diagnosis    Jentry Mcqueary, LCSW 09/11/2012

## 2012-09-14 ENCOUNTER — Encounter: Payer: Self-pay | Admitting: Obstetrics and Gynecology

## 2012-09-14 ENCOUNTER — Encounter: Payer: Self-pay | Admitting: Gynecology

## 2012-09-14 ENCOUNTER — Ambulatory Visit (INDEPENDENT_AMBULATORY_CARE_PROVIDER_SITE_OTHER): Payer: 59 | Admitting: Gynecology

## 2012-09-14 VITALS — BP 110/70 | Ht 68.0 in | Wt 208.0 lb

## 2012-09-14 DIAGNOSIS — Z13220 Encounter for screening for lipoid disorders: Secondary | ICD-10-CM

## 2012-09-14 DIAGNOSIS — Z309 Encounter for contraceptive management, unspecified: Secondary | ICD-10-CM

## 2012-09-14 DIAGNOSIS — Z01419 Encounter for gynecological examination (general) (routine) without abnormal findings: Secondary | ICD-10-CM

## 2012-09-14 NOTE — Patient Instructions (Signed)
Try new birth control pill. Call me if you have any issues with these. Followup in 1 year, sooner as needed.

## 2012-09-14 NOTE — Progress Notes (Signed)
[name] 28-Feb-1975 [id number]        36 y.o.  G0P0 for annual exam.  Former patient of Dr. Eda Paschal. Several issues noted below.  Past medical history,surgical history, medications, allergies, family history and social history were all reviewed and documented in the EPIC chart.  ROS:  Performed and pertinent positives and negatives are included in the history, assessment and plan .  Exam: Kim assistant Filed Vitals:   09/14/12 1057  BP: 110/70  Height: 5\' 8"  (1.727 m)  Weight: 208 lb (94.348 kg)   General appearance  Normal Skin grossly normal Head/Neck normal with no cervical or supraclavicular adenopathy thyroid normal Lungs  clear Cardiac RR, without RMG Abdominal  soft, nontender, without masses, organomegaly or hernia Breasts  examined lying and sitting without masses, retractions, discharge or axillary adenopathy. Bilateral reduction scars Pelvic  Ext/BUS/vagina  normal  Cervix  normal  Uterus  anteverted, normal size, shape and contour, midline and mobile nontender   Adnexa  Without masses or tenderness    Anus and perineum  normal   Rectovaginal  normal sphincter tone without palpated masses or tenderness.    Assessment/Plan:  37 y.o. G0P0 female for annual exam, regular menses, oral contraceptives.   1. Contraceptive management. Patient on 35 mcg pill. Has a history of migraines actively being treated. No real aura. Reviewed increased risk of stroke associated with birth control pills and migraine headaches which she is aware of. Alternative contraception to include IUD discussed. Patient wants to continue on the birth control pills. I did suggest switching to a lower dose pill and gave her 2 samples of LoLoEstrin. Assuming she does well with this and she'll continue on this. 2. Pap smear 2013 normal. History of several ascus/negative high risk HPV Pap smears in the past but most recent 3 Pap smears have been normal. No Pap smear done today. Plan repeat at 3 year  interval. 3. Breast self. SBE monthly reviewed. Plan mammogram at 40.  4. Health maintenance. Baseline CBC comprehensive metabolic panel lipid profile urinalysis ordered as future orders as she is not fasting she agrees to come back fasting. Followup in 1 year, sooner as needed  Note: This document was prepared with digital dictation and possible smart phrase technology. Any transcriptional errors that result from this process are unintentional.   Dara Lords MD, 11:27 AM 09/14/2012

## 2012-09-15 LAB — URINALYSIS W MICROSCOPIC + REFLEX CULTURE
Bacteria, UA: NONE SEEN
Casts: NONE SEEN
Crystals: NONE SEEN
Ketones, ur: NEGATIVE mg/dL
Leukocytes, UA: NEGATIVE
Nitrite: NEGATIVE
Specific Gravity, Urine: 1.014 (ref 1.005–1.030)
Squamous Epithelial / LPF: NONE SEEN
Urobilinogen, UA: 0.2 mg/dL (ref 0.0–1.0)
pH: 6 (ref 5.0–8.0)

## 2012-09-17 ENCOUNTER — Other Ambulatory Visit: Payer: 59

## 2012-09-21 ENCOUNTER — Other Ambulatory Visit: Payer: 59

## 2012-09-21 DIAGNOSIS — Z13220 Encounter for screening for lipoid disorders: Secondary | ICD-10-CM

## 2012-09-21 DIAGNOSIS — Z01419 Encounter for gynecological examination (general) (routine) without abnormal findings: Secondary | ICD-10-CM

## 2012-09-21 LAB — CBC WITH DIFFERENTIAL/PLATELET
Basophils Absolute: 0 10*3/uL (ref 0.0–0.1)
Basophils Relative: 0 % (ref 0–1)
Eosinophils Relative: 3 % (ref 0–5)
HCT: 40.1 % (ref 36.0–46.0)
Hemoglobin: 14.2 g/dL (ref 12.0–15.0)
Lymphocytes Relative: 45 % (ref 12–46)
MCHC: 35.4 g/dL (ref 30.0–36.0)
MCV: 91.1 fL (ref 78.0–100.0)
Monocytes Absolute: 0.7 10*3/uL (ref 0.1–1.0)
Monocytes Relative: 11 % (ref 3–12)
RDW: 12.8 % (ref 11.5–15.5)

## 2012-09-21 LAB — LIPID PANEL
HDL: 96 mg/dL (ref >39)
Total CHOL/HDL Ratio: 2.6 Ratio
Triglycerides: 113 mg/dL (ref <150)

## 2012-09-21 LAB — COMPREHENSIVE METABOLIC PANEL
AST: 21 U/L (ref 0–37)
Albumin: 4.2 g/dL (ref 3.5–5.2)
BUN: 15 mg/dL (ref 6–23)
Calcium: 9.1 mg/dL (ref 8.4–10.5)
Chloride: 104 mEq/L (ref 96–112)
Creat: 1.1 mg/dL (ref 0.50–1.10)
Glucose, Bld: 87 mg/dL (ref 70–99)
Potassium: 4.6 mEq/L (ref 3.5–5.3)

## 2012-09-25 ENCOUNTER — Other Ambulatory Visit: Payer: Self-pay | Admitting: Gynecology

## 2012-09-25 DIAGNOSIS — E7800 Pure hypercholesterolemia, unspecified: Secondary | ICD-10-CM

## 2012-09-26 ENCOUNTER — Ambulatory Visit (INDEPENDENT_AMBULATORY_CARE_PROVIDER_SITE_OTHER): Payer: 59 | Admitting: Licensed Clinical Social Worker

## 2012-09-26 DIAGNOSIS — F329 Major depressive disorder, single episode, unspecified: Secondary | ICD-10-CM

## 2012-09-26 NOTE — Progress Notes (Signed)
   THERAPIST PROGRESS NOTE  Session Time: 1:00pm-1:50pm  Participation Level: Active  Behavioral Response: Well GroomedAlertAnxious and Depressed  Type of Therapy: Individual Therapy  Treatment Goals addressed: Coping  Interventions: CBT, DBT, Strength-based, Supportive and Reframing  Summary: [name] is a 37 y.o. female who presents with depressed mood and congruent affect. She reports improvement in her degree of depression. She reports improved motivation and is pleased that she has started working out again. She has decided to set smaller, more reasonable goals for herself. She reports feeling dread when she thinks about training for an event. She is pleased that her step son is back in school since she does better when she knows what time she can have alone in the house. She continues to explore the negative consequences of her [employer] fundamentalist upbringing. Her sleep and appetite are wnl.    Suicidal/Homicidal: Nowithout intent/plan  Therapist Response: Assessed patients current functioning and reviewed progress. Reviewed coping strategies. Assessed patients safety and assisted in identifying protective factors.  Reviewed crisis plan with patient. Assisted patient with the expression of frustration. Reviewed patients self care plan. Assessed progress related to self care. Patients self care is good. Recommend daily exercise, increased socialization and recreation. Used CBT to assist patient with the identification of negative distortions and irrational thoughts. Encouraged patient to verbalize alternative and factual responses which challenge thought distortions. Used DBT to practice mindfulness, review distraction list and improve distress tolerance skills. Reviewed healthy boundaries and assertive communication.   Plan: Return again in one weeks.  Diagnosis: Axis I: Depressive Disorder NOS    Axis II: No diagnosis    Kinnie Kaupp, LCSW 09/26/2012

## 2012-10-03 ENCOUNTER — Ambulatory Visit (INDEPENDENT_AMBULATORY_CARE_PROVIDER_SITE_OTHER): Payer: 59 | Admitting: Licensed Clinical Social Worker

## 2012-10-03 DIAGNOSIS — F329 Major depressive disorder, single episode, unspecified: Secondary | ICD-10-CM

## 2012-10-03 NOTE — Progress Notes (Signed)
   THERAPIST PROGRESS NOTE  Session Time: 1:00pm-1:50pm  Participation Level: Active  Behavioral Response: Well GroomedAlertDepressed  Type of Therapy: Individual Therapy  Treatment Goals addressed: Coping  Interventions: CBT, Strength-based, Supportive and Reframing  Summary: [name] is a 37 y.o. female who presents with depressed mood and congruent affect. She reports some improvement in her depression, but generally feels down and thinks the dreary weather is contributing. She processes her ongoing frustration with her step son and her attempts to separate herself from him as much as possible. She continues to process and grieve how she has been negatively impacted by doctrine of Christian fundamentalism when she grew up. Her sleep and appetite are wnl.   Suicidal/Homicidal: Nowithout intent/plan  Therapist Response: Assessed patients current functioning and reviewed progress. Reviewed coping strategies. Assessed patients safety and assisted in identifying protective factors.  Reviewed crisis plan with patient. Assisted patient with the expression of frusrtration . Reviewed patients self care plan. Assessed progress related to self care. Patients self care is good. Recommend daily exercise, increased socialization and recreation. Used CBT to assist patient with the identification of negative distortions and irrational thoughts. Encouraged patient to verbalize alternative and factual responses which challenge thought distortions. Reviewed healthy boundaries and assertive communication. Processed and normalized patients grief reaction.   Plan: Return again in one weeks.  Diagnosis: Axis I: Depressive Disorder NOS    Axis II: No diagnosis    Jearld Hemp, LCSW 10/03/2012

## 2012-10-10 ENCOUNTER — Ambulatory Visit (INDEPENDENT_AMBULATORY_CARE_PROVIDER_SITE_OTHER): Payer: 59 | Admitting: Licensed Clinical Social Worker

## 2012-10-10 DIAGNOSIS — F329 Major depressive disorder, single episode, unspecified: Secondary | ICD-10-CM

## 2012-10-10 DIAGNOSIS — F32A Depression, unspecified: Secondary | ICD-10-CM

## 2012-10-10 NOTE — Progress Notes (Signed)
   THERAPIST PROGRESS NOTE  Session Time: 1:00pm-1:50pm  Participation Level: Active  Behavioral Response: Fairly GroomedAlertEuthymic  Type of Therapy: Individual Therapy  Treatment Goals addressed: Coping  Interventions: CBT, Strength-based, Supportive and Reframing  Summary: [name] is a 37 y.o. female who presents with euthymic mood and congruent affect. She reports improvement in her depression and is pleased that she has more motivation to exercise. She has decided to change her name and processes her reasoning behind this after five years of marriage. She remains frustrated with her step son but feels she is making improvements setting boundaries with him. She is working on negative self talk.    Suicidal/Homicidal: Nowithout intent/plan  Therapist Response: Assessed patients current functioning and reviewed progress. Reviewed coping strategies. Assessed patients safety and assisted in identifying protective factors.  Reviewed crisis plan with patient. Assisted patient with the expression of frustration with her step son. Reviewed patients self care plan. Assessed progress related to self care. Patients self care is good. Recommend daily exercise, increased socialization and recreation. Used CBT to assist patient with the identification of negative distortions and irrational thoughts. Encouraged patient to verbalize alternative and factual responses which challenge thought distortions. Reviewed healthy boundaries and assertive communication.   Plan: Return again in one weeks.  Diagnosis: Axis I: Depressive Disorder NOS    Axis II: No diagnosis    Katniss Weedman, LCSW 10/10/2012

## 2012-10-17 ENCOUNTER — Ambulatory Visit (INDEPENDENT_AMBULATORY_CARE_PROVIDER_SITE_OTHER): Payer: 59 | Admitting: Licensed Clinical Social Worker

## 2012-10-17 ENCOUNTER — Other Ambulatory Visit: Payer: 59

## 2012-10-17 DIAGNOSIS — F329 Major depressive disorder, single episode, unspecified: Secondary | ICD-10-CM

## 2012-10-17 DIAGNOSIS — E7800 Pure hypercholesterolemia, unspecified: Secondary | ICD-10-CM

## 2012-10-17 LAB — LIPID PANEL
Cholesterol: 230 mg/dL — ABNORMAL HIGH (ref 0–200)
LDL Cholesterol: 127 mg/dL — ABNORMAL HIGH (ref 0–99)
Total CHOL/HDL Ratio: 3.1 Ratio
VLDL: 28 mg/dL (ref 0–40)

## 2012-10-17 NOTE — Progress Notes (Signed)
   THERAPIST PROGRESS NOTE  Session Time: 1:00pm-1:50pm  Participation Level: Active  Behavioral Response: Well GroomedAlertDepressed  Type of Therapy: Individual Therapy  Treatment Goals addressed: Coping  Interventions: CBT, Strength-based, Supportive and Reframing  Summary: [name] is a 37 y.o. female who presents with depressed mood and flat affect. She reports improvement in her degree of depression with improved motivation. She does report noticing paranoid and negative thinking surrounding text messages she sent her good friend. She is pleased that she was able to recognize her cognitive distortions and re frame them, but expresses frustration that her initial instinct is to doubt herself and look for reasons why someone would be angry with her. She is challenged by distortions about her being a poor friend, which she is able to recognize is not true. She remains frustrated with her step son and processes her anxiety about him living with them after he graduates due to poor life planning. Her sleep and appetite are both wnl.   Suicidal/Homicidal: Nowithout intent/plan  Therapist Response: Assessed patients current functioning and reviewed progress. Reviewed coping strategies. Assessed patients safety and assisted in identifying protective factors.  Reviewed crisis plan with patient. Assisted patient with the expression of frustration with her step son. Reviewed patients self care plan. Assessed progress related to self care. Patients self care is good. Recommend daily exercise, increased socialization and recreation. Used CBT to assist patient with the identification of negative distortions and irrational thoughts. Encouraged patient to verbalize alternative and factual responses which challenge thought distortions. Reviewed healthy boundaries and assertive communication.   Plan: Return again in one weeks.  Diagnosis: Axis I: Depressive Disorder NOS    Axis II: No  diagnosis    Yafet Cline, LCSW 10/17/2012

## 2012-10-24 ENCOUNTER — Ambulatory Visit (INDEPENDENT_AMBULATORY_CARE_PROVIDER_SITE_OTHER): Payer: 59 | Admitting: Licensed Clinical Social Worker

## 2012-10-24 DIAGNOSIS — F329 Major depressive disorder, single episode, unspecified: Secondary | ICD-10-CM

## 2012-10-24 NOTE — Progress Notes (Signed)
   THERAPIST PROGRESS NOTE  Session Time: 1:00pm-1:50pm  Participation Level: Active  Behavioral Response: Well GroomedAlertAnxious  Type of Therapy: Individual Therapy  Treatment Goals addressed: Anxiety and Coping  Interventions: CBT, Strength-based, Supportive and Reframing  Summary: [name] is a 37 y.o. female who presents with anxious mood and congruent affect. She is tearful and scared and presents this Clinical research associate with a letter she wrote. The letter describes her attraction to this Clinical research associate and her fear over being rejected for revealing these feelings. She describes feeling preoccupied by these thoughts for the past few weeks, with in increase in these feelings over the past week. She admits to sexual dreams which she is embarrassed by. She holds her head down in shame for about half the session. She begins to give eye contact once she has been reassured that she will not be rejected. Her sleep and appetite are poor due to stress related to this transference.    Suicidal/Homicidal: Nowithout intent/plan  Therapist Response: Assessed patients current functioning and reviewed progress. Reviewed coping strategies. Assessed patients safety and assisted in identifying protective factors.  Reviewed crisis plan with patient. Assisted patient with the expression of anxiety and fear. Reviewed patients self care plan. Assessed progress related to self care. Patients self care is good. Recommend daily exercise, increased socialization and recreation. Used CBT to assist patient with the identification of negative distortions and irrational thoughts. Encouraged patient to verbalize alternative and factual responses which challenge thought distortions. Discussed patients attraction, normalized her transference response and discussed implications of this attraction on her therapy. At this time, both patient and Clinical research associate agree to work through patients transference as an opportunity to explore deeper  issues. If at any time this transference interferes with patients ability to get what she needs, transfer will be discussed.   Plan: Return again in one weeks.  Diagnosis: Axis I: Depressive Disorder NOS    Axis II: No diagnosis    Ranjit Ashurst, LCSW 10/24/2012

## 2012-10-30 ENCOUNTER — Ambulatory Visit (INDEPENDENT_AMBULATORY_CARE_PROVIDER_SITE_OTHER): Payer: 59 | Admitting: Psychiatry

## 2012-10-30 ENCOUNTER — Encounter (HOSPITAL_COMMUNITY): Payer: Self-pay | Admitting: Psychiatry

## 2012-10-30 VITALS — BP 124/84 | HR 89 | Ht 68.0 in | Wt 218.0 lb

## 2012-10-30 DIAGNOSIS — F339 Major depressive disorder, recurrent, unspecified: Secondary | ICD-10-CM

## 2012-10-30 DIAGNOSIS — F329 Major depressive disorder, single episode, unspecified: Secondary | ICD-10-CM

## 2012-10-30 MED ORDER — TRAZODONE HCL 50 MG PO TABS: 50.0000 mg | ORAL_TABLET | Freq: Every day | ORAL | Status: DC

## 2012-10-30 MED ORDER — DULOXETINE HCL 30 MG PO CPEP: ORAL_CAPSULE | ORAL | Status: DC
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
## 2012-10-31 ENCOUNTER — Ambulatory Visit (INDEPENDENT_AMBULATORY_CARE_PROVIDER_SITE_OTHER): Payer: 59 | Admitting: Licensed Clinical Social Worker

## 2012-10-31 DIAGNOSIS — F329 Major depressive disorder, single episode, unspecified: Secondary | ICD-10-CM
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
## 2012-11-08 ENCOUNTER — Ambulatory Visit (INDEPENDENT_AMBULATORY_CARE_PROVIDER_SITE_OTHER): Payer: 59 | Admitting: Licensed Clinical Social Worker

## 2012-11-08 DIAGNOSIS — F329 Major depressive disorder, single episode, unspecified: Secondary | ICD-10-CM
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
## 2012-11-13 ENCOUNTER — Telehealth: Payer: Self-pay | Admitting: *Deleted

## 2012-11-13 MED ORDER — NORETHIN-ETH ESTRAD-FE BIPHAS 1 MG-10 MCG / 10 MCG PO TABS: 1.0000 | ORAL_TABLET | Freq: Every day | ORAL | Status: DC

## 2012-11-13 NOTE — Telephone Encounter (Signed)
Pt has samples of lo Loestrin Fe on 09/14/12 samples worked great. Pt would like Rx for this, per note on 09/14/12. Rx will be sent.

## 2012-11-15 ENCOUNTER — Ambulatory Visit (INDEPENDENT_AMBULATORY_CARE_PROVIDER_SITE_OTHER): Payer: 59 | Admitting: Licensed Clinical Social Worker

## 2012-11-15 DIAGNOSIS — F329 Major depressive disorder, single episode, unspecified: Secondary | ICD-10-CM
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
## 2012-11-22 ENCOUNTER — Ambulatory Visit (INDEPENDENT_AMBULATORY_CARE_PROVIDER_SITE_OTHER): Payer: 59 | Admitting: Licensed Clinical Social Worker

## 2012-11-22 DIAGNOSIS — F329 Major depressive disorder, single episode, unspecified: Secondary | ICD-10-CM
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
## 2012-11-27 ENCOUNTER — Ambulatory Visit (INDEPENDENT_AMBULATORY_CARE_PROVIDER_SITE_OTHER): Payer: 59 | Admitting: Licensed Clinical Social Worker

## 2012-11-27 DIAGNOSIS — F329 Major depressive disorder, single episode, unspecified: Secondary | ICD-10-CM

## 2012-11-27 NOTE — Progress Notes (Signed)
   THERAPIST PROGRESS NOTE  Session Time: 1:00pm-1:50pm  Participation Level: Active  Behavioral Response: Well GroomedAlertDepressed  Type of Therapy: Individual Therapy  Treatment Goals addressed: Coping  Interventions: CBT, DBT, Strength-based, Supportive and Reframing  Summary: [name] is a 37 y.o. female who presents with depressed mood and affect. She is much less anxious and reports feeling better. She reports distinct improvement in her locus of control and feels that she is now in control of her trauma symptoms and not a victim of them. She contacted [facility] about DBT group and she is unable to join the current group and they will not offer an adult DBT group in the spring. Will look for alternative resources. She did purchase DBT workbook but has not begun work in it yet due to her mother visiting. She was able to enjoy her time with her mother and is pleased by this. Her nightmares have stopped. Her sleep and appetite have both improved.   Suicidal/Homicidal: Nowithout intent/plan  Therapist Response: Assessed patients current functioning and reviewed progress. Reviewed coping strategies. Assessed patients safety and assisted in identifying protective factors.  Reviewed crisis plan with patient. Assisted patient with the expression of frustration with the process of talking about trauma. Reviewed patients self care plan. Assessed progress related to self care. Patients self care is good. Recommend daily exercise, increased socialization and recreation. Used CBT to assist patient with the identification of negative distortions and irrational thoughts. Encouraged patient to verbalize alternative and factual responses which challenge thought distortions. Used DBT to practice mindfulness, review distraction list and improve distress tolerance skills. Reviewed healthy boundaries and assertive communication.   Plan: Return again in one weeks.  Diagnosis: Axis I: Depressive  Disorder NOS    Axis II: No diagnosis    Verina Galeno, LCSW 11/27/2012

## 2012-11-29 ENCOUNTER — Other Ambulatory Visit: Payer: Self-pay

## 2012-12-05 ENCOUNTER — Ambulatory Visit (INDEPENDENT_AMBULATORY_CARE_PROVIDER_SITE_OTHER): Payer: 59 | Admitting: Licensed Clinical Social Worker

## 2012-12-05 DIAGNOSIS — F329 Major depressive disorder, single episode, unspecified: Secondary | ICD-10-CM
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
## 2012-12-12 ENCOUNTER — Ambulatory Visit (INDEPENDENT_AMBULATORY_CARE_PROVIDER_SITE_OTHER): Payer: 59 | Admitting: Licensed Clinical Social Worker

## 2012-12-12 DIAGNOSIS — F329 Major depressive disorder, single episode, unspecified: Secondary | ICD-10-CM

## 2012-12-12 NOTE — Progress Notes (Signed)
   THERAPIST PROGRESS NOTE  Session Time: 1:00pm-1:50pm  Participation Level: Active  Behavioral Response: Well GroomedAlertAnxious and Depressed  Type of Therapy: Individual Therapy  Treatment Goals addressed: Coping  Interventions: CBT, DBT, Strength-based, Supportive and Reframing  Summary: [name] is a 37 y.o. female who presents with depressed mood and anxious affect. She reports having an average week and discusses her plans for thanksgiving. She continues to work in the [employer]. Processed her difficulty with self hatred, negative self talk and body image. She processes her body hatred and history of self harm. She processes her experience in college being weighed and having her body fat checked every two weeks. She has strick rules by which she tries to adhere which set her up to have unrealistic expectations of herself.    Suicidal/Homicidal: Nowithout intent/plan  Therapist Response: Assessed patients current functioning and reviewed progress. Reviewed coping strategies. Assessed patients safety and assisted in identifying protective factors.  Reviewed crisis plan with patient. Assisted patient with the expression of anxiety . Reviewed patients self care plan. Assessed progress related to self care. Patients self care is good. Recommend daily exercise, increased socialization and recreation. Reviewed healthy boundaries and assertive communication. Used CBT to assist patient with the identification of negative distortions and irrational thoughts. Encouraged patient to verbalize alternative and factual responses which challenge thought distortions. Used DBT to practice mindfulness, review distraction list and improve distress tolerance skills.   Plan: Return again in one weeks.  Diagnosis: Axis I: Depressive Disorder NOS    Axis II: No diagnosis    [name], LCSW 12/12/2012

## 2012-12-18 ENCOUNTER — Ambulatory Visit (INDEPENDENT_AMBULATORY_CARE_PROVIDER_SITE_OTHER): Payer: 59 | Admitting: Licensed Clinical Social Worker

## 2012-12-18 DIAGNOSIS — F329 Major depressive disorder, single episode, unspecified: Secondary | ICD-10-CM

## 2012-12-18 NOTE — Progress Notes (Signed)
   THERAPIST PROGRESS NOTE  Session Time: 1:00pm-1:50pm  Participation Level: Active  Behavioral Response: Well GroomedAlertAnxious, Depressed and Worthless  Type of Therapy: Individual Therapy  Treatment Goals addressed: Coping  Interventions: CBT, DBT, Strength-based, Supportive and Reframing  Summary: [name] is a 37 y.o. female who presents with depressed mood and anxious affect. She reports increased mood lability and frustration over this. She reports often having sex with her husband when she does not want to. She states that she occasionally tells her husband, but most often goes along with it. She shows this Statistician of herself as a small child and reports that she called herself [name] and wanted others to call her that. She endorses early sexual identity confusion and reports adapting to societal expectations by "killing [name]". Patient believes that some of her current body image problems are related to this. She expresses and demonstrates a high need for approval and assurance. She is challenged by appropriate limit setting by this Clinical research associate. Her sleep and appetite are disrupted.   Suicidal/Homicidal: Nowithout intent/plan  Therapist Response: Assessed patients current functioning and reviewed progress. Reviewed coping strategies. Assessed patients safety and assisted in identifying protective factors.  Reviewed crisis plan with patient. Assisted patient with the expression of fear and anxiety. Reviewed patients self care plan. Assessed progress related to self care. Patients self care is good. Recommend daily exercise, increased socialization and recreation. Used CBT to assist patient with the identification of negative distortions and irrational thoughts. Encouraged patient to verbalize alternative and factual responses which challenge thought distortions. Used DBT to practice mindfulness, review distraction list and improve distress tolerance skills. Reviewed healthy  boundaries and assertive communication.   Plan: Return again in one weeks.  Diagnosis: Axis I: Depressive Disorder NOS    Axis II: No diagnosis    [name], LCSW 12/18/2012

## 2012-12-25 ENCOUNTER — Telehealth: Payer: Self-pay | Admitting: *Deleted

## 2012-12-25 MED ORDER — NORETHINDRONE ACET-ETHINYL EST 1-20 MG-MCG PO TABS: 1.0000 | ORAL_TABLET | Freq: Every day | ORAL | Status: DC

## 2012-12-25 NOTE — Telephone Encounter (Signed)
Pt birth control pills were switches to lo Loestrin Fe on 09/14/12 and has been on this for 4 months and c/o having spotting/cycle once every 2 weeks. Pt was on mononessa and would like to know if switching back would be an option? Please advise

## 2012-12-25 NOTE — Telephone Encounter (Signed)
I would suggest trying Loestrin 120 equivalent this will increase her estrogen but not as much as he MonoNessa. Dispense one pack with refills through August 2015 call if irregular bleeding continues

## 2012-12-25 NOTE — Telephone Encounter (Signed)
Left the below on pt voicemail as directed, rx sent, told he to call if questions.

## 2013-01-01 ENCOUNTER — Ambulatory Visit (INDEPENDENT_AMBULATORY_CARE_PROVIDER_SITE_OTHER): Payer: 59 | Admitting: Licensed Clinical Social Worker

## 2013-01-01 DIAGNOSIS — F329 Major depressive disorder, single episode, unspecified: Secondary | ICD-10-CM

## 2013-01-01 NOTE — Progress Notes (Signed)
   THERAPIST PROGRESS NOTE  Session Time: 2:00pm-2:50pm  Participation Level: Active  Behavioral Response: Well GroomedAlertAnxious and Depressed  Type of Therapy: Individual Therapy  Treatment Goals addressed: Coping  Interventions: CBT, DBT, Strength-based, Supportive and Reframing  Summary: [name] is a 37 y.o. female who presents with depressed mood and anxious affect. She is tearful when processing her feelings about her last session. She endorses feeling rejected and that she had strong feelings to kill herself after the last session. She states that she sat in her car for one hour trying to calm herself down. She did not tell anyone about her feelings to harm herself. She reports that she did not follow through because she promised her mother that she would never try to harm herself again and she also thought this writer would not care. Patient was able to express her anger over this writers refusal to hug patient after her session as patient asked for. Patient is able to verbalize her growing understanding of the need for these boundaries. She has been working on her DBT workbook and reviewed in session. Her sleep and appetite have been disrupted due to migraines.    Suicidal/Homicidal: Nowithout intent/plan  Therapist Response: Assessed patients current functioning and reviewed progress. Reviewed coping strategies. Assessed patients safety and assisted in identifying protective factors.  Reviewed crisis plan with patient. Assisted patient with the expression of anxeity. Reviewed patients self care plan. Assessed progress related to self care. Patients self care is good. Recommend daily exercise, increased socialization and recreation. Used CBT to assist patient with the identification of negative distortions and irrational thoughts. Encouraged patient to verbalize alternative and factual responses which challenge thought distortions. Used DBT to practice mindfulness, review  distraction list and improve distress tolerance skills. Reviewed healthy boundaries and assertive communication.   Plan: Return again in one weeks.  Diagnosis: Axis I: Depressive Disorder NOS    Axis II: No diagnosis    Yolanda Bubar, LCSW 01/01/2013

## 2013-01-08 ENCOUNTER — Ambulatory Visit (INDEPENDENT_AMBULATORY_CARE_PROVIDER_SITE_OTHER): Payer: 59 | Admitting: Licensed Clinical Social Worker

## 2013-01-08 DIAGNOSIS — F329 Major depressive disorder, single episode, unspecified: Secondary | ICD-10-CM

## 2013-01-08 NOTE — Progress Notes (Signed)
   THERAPIST PROGRESS NOTE  Session Time: 1:00pm-1:50pm  Participation Level: Active  Behavioral Response: Well GroomedAlertAnxious and Depressed  Type of Therapy: Individual Therapy  Treatment Goals addressed: Anxiety, Communication: expressing her feelings to her husband, setting limits with her step son, exploring her confusion around her spiritual beliefs , Coping and Diagnosis: derpession  Interventions: CBT, DBT, Motivational Interviewing, Strength-based, Supportive and Reframing  Summary: [name] is a 37 y.o. female who presents with depressed mood and anxious affect. She is taking very good care of herself physically, but states that her depression has increased lately due to holiday pressures. She processes her frustration with holiday parties, having to interact with others and pretend she feels fine. She discusses her confusion over her spiritual beliefs and her desire for clarity. She endorses strong feelings of guilt and shame. Her sleep is disrupted and her appetite is wnl.  Suicidal/Homicidal: Nowithout intent/plan  Therapist Response: Assessed patients current functioning and reviewed progress. Reviewed coping strategies. Assessed patients safety and assisted in identifying protective factors.  Reviewed crisis plan with patient. Assisted patient with the expression of frustration and anxiety. Reviewed patients self care plan. Assessed progress related to self care. Patients self care is good. Recommend daily exercise, increased socialization and recreation. Used CBT to assist patient with the identification of negative distortions and irrational thoughts. Encouraged patient to verbalize alternative and factual responses which challenge thought distortions. Used DBT to practice mindfulness, review distraction list and improve distress tolerance skills. Reviewed healthy boundaries and assertive communication. Used motivational interviewing to assist and encourage patient  through the change process. Explored patients barriers to change.   Plan: Return again in one weeks.  Diagnosis: Axis I: Depressive Disorder NOS    Axis II: No diagnosis    Ragnar Waas, LCSW 01/08/2013

## 2013-01-14 ENCOUNTER — Ambulatory Visit (INDEPENDENT_AMBULATORY_CARE_PROVIDER_SITE_OTHER): Payer: 59 | Admitting: Licensed Clinical Social Worker

## 2013-01-14 DIAGNOSIS — F329 Major depressive disorder, single episode, unspecified: Secondary | ICD-10-CM

## 2013-01-14 NOTE — Progress Notes (Signed)
   THERAPIST PROGRESS NOTE  Session Time: 3:00pm-3:50pm  Participation Level: Active  Behavioral Response: Well GroomedAlertAnxious and Depressed  Type of Therapy: Individual Therapy  Treatment Goals addressed: Coping  Interventions: CBT, DBT, Strength-based, Supportive and Reframing  Summary: [name] is a 37 y.o. female who presents with anxious mood and affect. She reports improvement in her sleep with a decrease in nightmares. She expresses anxiety related to the Christmas holiday and time spent with family. She denies any desire for self harm. She is working in her DBT workbook and processes her feelings associated with exploring her spirituality.   Suicidal/Homicidal: Nowithout intent/plan  Therapist Response: Assessed patients current functioning and reviewed progress. Reviewed coping strategies. Assessed patients safety and assisted in identifying protective factors.  Reviewed crisis plan with patient. Assisted patient with the expression of frustration and anxiety. Reviewed patients self care plan. Assessed progress related to self care. Patients self care is good. Recommend daily exercise, increased socialization and recreation. Used DBT to practice mindfulness, review distraction list and improve distress tolerance skills. Used CBT to assist patient with the identification of negative distortions and irrational thoughts. Encouraged patient to verbalize alternative and factual responses which challenge thought distortions. Reviewed healthy boundaries and assertive communication.   Plan: Return again in one weeks.   Diagnosis: Axis I: Depressive Disorder NOS    Axis II: No diagnosis    Yolanda Nodine, LCSW 01/14/2013

## 2013-01-21 ENCOUNTER — Ambulatory Visit (HOSPITAL_COMMUNITY): Payer: Self-pay | Admitting: Licensed Clinical Social Worker

## 2013-01-28 ENCOUNTER — Ambulatory Visit (INDEPENDENT_AMBULATORY_CARE_PROVIDER_SITE_OTHER): Payer: 59 | Admitting: Licensed Clinical Social Worker

## 2013-01-28 DIAGNOSIS — F3289 Other specified depressive episodes: Secondary | ICD-10-CM

## 2013-01-28 DIAGNOSIS — F32A Depression, unspecified: Secondary | ICD-10-CM

## 2013-01-28 DIAGNOSIS — F329 Major depressive disorder, single episode, unspecified: Secondary | ICD-10-CM
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
## 2013-01-30 ENCOUNTER — Ambulatory Visit (INDEPENDENT_AMBULATORY_CARE_PROVIDER_SITE_OTHER): Payer: 59 | Admitting: Psychiatry

## 2013-01-30 ENCOUNTER — Encounter (HOSPITAL_COMMUNITY): Payer: Self-pay | Admitting: Psychiatry

## 2013-01-30 VITALS — BP 122/90 | HR 80 | Ht 68.0 in | Wt 218.4 lb

## 2013-01-30 DIAGNOSIS — F339 Major depressive disorder, recurrent, unspecified: Secondary | ICD-10-CM

## 2013-01-30 DIAGNOSIS — F329 Major depressive disorder, single episode, unspecified: Secondary | ICD-10-CM

## 2013-01-30 MED ORDER — DULOXETINE HCL 30 MG PO CPEP: ORAL_CAPSULE | ORAL | Status: DC

## 2013-01-30 MED ORDER — TRAZODONE HCL 50 MG PO TABS: 50.0000 mg | ORAL_TABLET | Freq: Every day | ORAL | Status: DC

## 2013-01-30 NOTE — Progress Notes (Signed)
[facility] Progress Note  ALANDRIA BUTKIEWICZ [id number] 38 y.o.  01/30/2013 1:44 PM  Chief Complaint:  Medication management and followup.  History of Present Illness:  [name] came for her followup appointment.  She is compliant with her psychotropic medication.  She had a good Christmas .  She spent time with her in-laws .  Next week she was able to see her own parents .  She is compliant with Cymbalta and trazodone.  She sleeping good.  She has good energy level.  She continues to engage in exercise and running .  She is relieved that her headaches are not as intense.  She has not taken Imitrex in past 2 weeks.  She continues to take Zonegran every day.  She denies any feeling of hopelessness or worthlessness.  She denies any recent crying spells.  She seems honest and regularly.  She is not drinking or using any illegal substances.  She denies any tremors or shakes.  Suicidal Ideation: No Plan Formed: No Patient has means to carry out plan: No  Homicidal Ideation: No Plan Formed: No Patient has means to carry out plan: No  Medical History; Patient has history of migraine .    Past Psychiatric History/Hospitalization(s) Patient endorses history of depression since her teens.  She denies any history of suicidal attempt or any inpatient psychiatric treatment.  In the past she has prescribed Paxil by her primary care physician however she did not like the Paxil because of side effects.  She had tried [place] but did not help her.  Patient denies any history of suicidal attempt, mania, hallucination, paranoia, aggression or violence.  She denies any history of panic attack, OCD symptoms or PTSD symptoms. Anxiety: Yes Bipolar Disorder: No Depression: Yes Mania: No Psychosis: No Schizophrenia: No Personality Disorder: No Hospitalization for psychiatric illness: No History of Electroconvulsive Shock Therapy: No Prior Suicide Attempts: No   Review of  Systems: Psychiatric: Agitation: No Hallucination: No Depressed Mood: No Insomnia: No Hypersomnia: No Altered Concentration: No Feels Worthless: No Grandiose Ideas: No Belief In Special Powers: No New/Increased Substance Abuse: No Compulsions: No  Neurologic: Headache: No Seizure: No Paresthesias: No    Outpatient Encounter Prescriptions as of 01/30/2013  Medication Sig  . DULoxetine (CYMBALTA) 30 MG capsule Take 3 daily  . Magnesium 250 MG TABS Take 250 mg by mouth 3 (three) times daily.    Marland Kitchen MONONESSA 0.25-35 MG-MCG tablet TAKE 1 TABLET BY MOUTH EVERY DAY  . norethindrone-ethinyl estradiol (MICROGESTIN,JUNEL,LOESTRIN) 1-20 MG-MCG tablet Take 1 tablet by mouth daily.  . Omega-3 Fatty Acids (OMEGA 3 PO) Take by mouth.    . SUMAtriptan (IMITREX) 100 MG tablet Take 100 mg by mouth as needed.    . traZODone (DESYREL) 50 MG tablet Take 1 tablet (50 mg total) by mouth at bedtime.  Marland Kitchen zonisamide (ZONEGRAN) 100 MG capsule Take 100 mg by mouth daily.  . [DISCONTINUED] DULoxetine (CYMBALTA) 30 MG capsule Take 3 daily  . [DISCONTINUED] Norethindrone-Ethinyl Estradiol-Fe Biphas (LO LOESTRIN FE) 1 MG-10 MCG / 10 MCG tablet Take 1 tablet by mouth daily.  . [DISCONTINUED] traZODone (DESYREL) 50 MG tablet Take 1 tablet (50 mg total) by mouth at bedtime.    No results found for this or any previous visit (from the past 2160 hour(s)).    Physical Exam: Constitutional:  BP 122/90  Pulse 80  Ht 5\' 8"  (1.727 m)  Wt 218 lb 6.4 oz (99.066 kg)  BMI 33.22 kg/m2  Musculoskeletal: Strength & Muscle  Tone: within normal limits Gait & Station: normal Patient leans: N/A  Mental Status Examination;  Patient is casually dressed and groomed.  She is mildly obese female and maintain good eye contact.  Her speech is soft clear and coherent.  Her thought processes logical and goal-directed.  She describes her mood neutral and her affect is mood appropriate.  She denies any auditory or visual  hallucination.  She denies any active or passive suicidal thoughts or homicidal thoughts.  There were no paranoia or delusions obsession present at this time.  There were no flight of ideas or any loose associations present.  Her fund of knowledge is adequate.  She is alert and oriented x3.  There were no tremors or shakes present.  Her insight judgment and impulse control is okay.    Medical Decision Making (Choose Three): Established Problem, Stable/Improving (1), Review of Last Therapy Session (1) and Review of New Medication or Change in Dosage (2)  Assessment: Axis I: Maj. depressive disorder, recurrent  Axis II: Deferred  Axis III:  Past Medical History  Diagnosis Date  . Migraines   . Kidney stones   . Depression   . ASCUS (atypical squamous cells of undetermined significance) on Pap smear     Axis IV: Mild   Plan:  Patient is doing better on her Cymbalta and trazodone.  Recommend to continue Cymbalta 90 mg daily and trazodone 50 mg at bedtime.  Recommend to call us back if she has any question or any concern.  Recommend to continue counseling for coping and social skills.  Followup in 3 months.   Shaindy Reader T., MD 01/30/2013

## 2013-02-11 ENCOUNTER — Ambulatory Visit (INDEPENDENT_AMBULATORY_CARE_PROVIDER_SITE_OTHER): Payer: 59 | Admitting: Licensed Clinical Social Worker

## 2013-02-11 DIAGNOSIS — F329 Major depressive disorder, single episode, unspecified: Secondary | ICD-10-CM

## 2013-02-11 DIAGNOSIS — F3289 Other specified depressive episodes: Secondary | ICD-10-CM

## 2013-02-11 DIAGNOSIS — F32A Depression, unspecified: Secondary | ICD-10-CM

## 2013-02-11 NOTE — Progress Notes (Signed)
   THERAPIST PROGRESS NOTE  Session Time: 2:00pm-2:50pm  Participation Level: Active  Behavioral Response: Well GroomedAlertAnxious and Depressed  Type of Therapy: Individual Therapy  Treatment Goals addressed: Coping  Interventions: CBT, DBT, Strength-based, Supportive and Reframing  Summary: [name] is a 38 y.o. female who presents with depressed mood and tearful affect. She reports that things have been going well, but she is now upset after learning that this Probation officer is leaving. She is tearful throughout the session and describes her ongoing anxiety related to her PTSD. She is unable to trust when she feels well because something "bad" is always lurking. She is upset over the news this writer is leaving and questions what the point is of continuing therapy with someone else. She endorses feeling rejected. Explored self injurious behavior which may result from feeling abandoned. Patient states that she feels confident that she is not going to hurt herself because she is not the cause of this decision. Will process transition in the next two sessions.    Suicidal/Homicidal: Nowithout intent/plan  Therapist Response: Assessed patients current functioning and reviewed progress. Reviewed coping strategies. Assessed patients safety and assisted in identifying protective factors.  Reviewed crisis plan with patient. Assisted patient with the expression of sadness . Reviewed patients self care plan. Assessed progress related to self care. Patients self care is good. Recommend daily exercise, increased socialization and recreation. Used CBT to assist patient with the identification of negative distortions and irrational thoughts. Encouraged patient to verbalize alternative and factual responses which challenge thought distortions. Used DBT to practice mindfulness, review distraction list and improve distress tolerance skills. Reviewed healthy boundaries and assertive communication. Used  motivational interviewing to assist and encourage patient through the change process. Explored patients barriers to change.   Plan: Return again in one weeks.  Diagnosis: Axis I: Depressive Disorder NOS and Post Traumatic Stress Disorder    Axis II: No diagnosis    Cormick Moss, LCSW 02/11/2013

## 2013-02-19 ENCOUNTER — Ambulatory Visit (INDEPENDENT_AMBULATORY_CARE_PROVIDER_SITE_OTHER): Payer: 59 | Admitting: Licensed Clinical Social Worker

## 2013-02-19 DIAGNOSIS — F32A Depression, unspecified: Secondary | ICD-10-CM

## 2013-02-19 DIAGNOSIS — F3289 Other specified depressive episodes: Secondary | ICD-10-CM

## 2013-02-19 DIAGNOSIS — F329 Major depressive disorder, single episode, unspecified: Secondary | ICD-10-CM
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
## 2013-03-04 ENCOUNTER — Ambulatory Visit (INDEPENDENT_AMBULATORY_CARE_PROVIDER_SITE_OTHER): Payer: 59 | Admitting: Licensed Clinical Social Worker

## 2013-03-04 DIAGNOSIS — F3289 Other specified depressive episodes: Secondary | ICD-10-CM

## 2013-03-04 DIAGNOSIS — F329 Major depressive disorder, single episode, unspecified: Secondary | ICD-10-CM

## 2013-03-04 DIAGNOSIS — F32A Depression, unspecified: Secondary | ICD-10-CM

## 2013-03-04 NOTE — Progress Notes (Signed)
   THERAPIST PROGRESS NOTE  Session Time: 1:00pm-1:50pm  Participation Level: Active  Behavioral Response: Well GroomedAlertAnxious and Depressed  Type of Therapy: Individual Therapy  Treatment Goals addressed: Coping  Interventions: CBT, DBT, Strength-based, Supportive and Reframing  Summary: [name] is a 38 y.o. female who presents with depressed mood and anxious affect. She is anxious about this being the final session. She has decided to continue therapy with Kristine Garbe, although she is very nervous about this. Processed her time in therapy with this Probation officer and discussed her growth. She is able to acknowledge that she has improved at setting boundaries and beginning to communicate her needs. Patient is not sleeping well due to nightmares and racing thoughts. Recommend she see Dr. Adele Schilder sooner than her next appointment.    Suicidal/Homicidal: Nowithout intent/plan  Therapist Response: Assessed patients current functioning and reviewed progress. Reviewed coping strategies. Assessed patients safety and assisted in identifying protective factors.  Reviewed crisis plan with patient. Assisted patient with the expression of fear and anxiety. Reviewed patients self care plan. Assessed progress related to self care. Patients self care is good. Recommend daily exercise, increased socialization and recreation. Used CBT to assist patient with the identification of negative distortions and irrational thoughts. Encouraged patient to verbalize alternative and factual responses which challenge thought distortions. Used DBT to practice mindfulness, review distraction list and improve distress tolerance skills. Reviewed healthy boundaries and assertive communication.   Plan: Referred to Kristine Garbe, for DBT treatment. Patient has contacted therapist to begin treatment.  Diagnosis: Axis I: Major Depression, Recurrent severe    Axis II: No diagnosis    Muzammil Bruins, LCSW 03/04/2013

## 2013-03-11 ENCOUNTER — Ambulatory Visit (HOSPITAL_COMMUNITY): Payer: Self-pay | Admitting: Licensed Clinical Social Worker

## 2013-03-12 ENCOUNTER — Ambulatory Visit (HOSPITAL_COMMUNITY): Payer: Self-pay | Admitting: Licensed Clinical Social Worker

## 2013-03-18 ENCOUNTER — Ambulatory Visit (HOSPITAL_COMMUNITY): Payer: Self-pay | Admitting: Licensed Clinical Social Worker

## 2013-03-25 ENCOUNTER — Ambulatory Visit (HOSPITAL_COMMUNITY): Payer: Self-pay | Admitting: Licensed Clinical Social Worker

## 2013-04-08 ENCOUNTER — Ambulatory Visit (HOSPITAL_COMMUNITY): Payer: Self-pay | Admitting: Licensed Clinical Social Worker

## 2013-04-15 ENCOUNTER — Ambulatory Visit (INDEPENDENT_AMBULATORY_CARE_PROVIDER_SITE_OTHER): Payer: 59 | Admitting: Licensed Clinical Social Worker

## 2013-04-15 ENCOUNTER — Ambulatory Visit (HOSPITAL_COMMUNITY): Payer: Self-pay | Admitting: Licensed Clinical Social Worker

## 2013-04-15 DIAGNOSIS — IMO0002 Reserved for concepts with insufficient information to code with codable children: Secondary | ICD-10-CM

## 2013-04-22 ENCOUNTER — Ambulatory Visit (INDEPENDENT_AMBULATORY_CARE_PROVIDER_SITE_OTHER): Payer: 59 | Admitting: Licensed Clinical Social Worker

## 2013-04-22 ENCOUNTER — Ambulatory Visit (HOSPITAL_COMMUNITY): Payer: Self-pay | Admitting: Licensed Clinical Social Worker

## 2013-04-22 DIAGNOSIS — IMO0002 Reserved for concepts with insufficient information to code with codable children: Secondary | ICD-10-CM

## 2013-05-06 ENCOUNTER — Ambulatory Visit (INDEPENDENT_AMBULATORY_CARE_PROVIDER_SITE_OTHER): Payer: 59 | Admitting: Psychiatry

## 2013-05-06 ENCOUNTER — Encounter (HOSPITAL_COMMUNITY): Payer: Self-pay | Admitting: Psychiatry

## 2013-05-06 VITALS — BP 129/90 | HR 96 | Ht 68.0 in | Wt 221.0 lb

## 2013-05-06 DIAGNOSIS — F329 Major depressive disorder, single episode, unspecified: Secondary | ICD-10-CM

## 2013-05-06 DIAGNOSIS — F339 Major depressive disorder, recurrent, unspecified: Secondary | ICD-10-CM

## 2013-05-06 MED ORDER — DULOXETINE HCL 30 MG PO CPEP: ORAL_CAPSULE | ORAL | Status: DC

## 2013-05-06 MED ORDER — TRAZODONE HCL 50 MG PO TABS: 50.0000 mg | ORAL_TABLET | Freq: Every day | ORAL | Status: DC

## 2013-05-06 NOTE — Progress Notes (Signed)
[facility] Progress Note  [name] [id number] 38 y.o.  05/06/2013 2:07 PM  Chief Complaint:  Medication management and followup.  History of Present Illness:  Shealeigh came for her followup appointment.  She is compliant with her psychotropic medication.  She missed her previous therapist who left the practice.  She is seeing [name] at [facility] for counseling.  She admitted sometime feeling sad and depressed but overall she is feeling better.  She denies any recent crying spells, feelings of hopelessness or worthlessness.  She continued to engage in running and recently finished 10 k race.  She denies any side effects from trazodone and Cymbalta.  Her sleep is improved from the past.  She denies any tremors, shakes or any side effects.  She was to continue her current psychotropic medication.  Suicidal Ideation: No Plan Formed: No Patient has means to carry out plan: No  Homicidal Ideation: No Plan Formed: No Patient has means to carry out plan: No  Medical History; Patient has history of migraine .    Past Psychiatric History/Hospitalization(s) Patient endorses history of depression since her teens.  She denies any history of suicidal attempt or any inpatient psychiatric treatment.  In the past she has prescribed Paxil by her primary care physician however she did not like the Paxil because of side effects.  She had tried [place] but did not help her.  Patient denies any history of suicidal attempt, mania, hallucination, paranoia, aggression or violence.  She denies any history of panic attack, OCD symptoms or PTSD symptoms. Anxiety: Yes Bipolar Disorder: No Depression: Yes Mania: No Psychosis: No Schizophrenia: No Personality Disorder: No Hospitalization for psychiatric illness: No History of Electroconvulsive Shock Therapy: No Prior Suicide Attempts: No   Review of Systems: Psychiatric: Agitation: No Hallucination: No Depressed Mood:  No Insomnia: No Hypersomnia: No Altered Concentration: No Feels Worthless: No Grandiose Ideas: No Belief In Special Powers: No New/Increased Substance Abuse: No Compulsions: No  Neurologic: Headache: No Seizure: No Paresthesias: No    Outpatient Encounter Prescriptions as of 05/06/2013  Medication Sig  . zonisamide (ZONEGRAN) 100 MG capsule Take 150 mg by mouth daily.  . DULoxetine (CYMBALTA) 30 MG capsule Take 3 daily  . Magnesium 250 MG TABS Take 250 mg by mouth 3 (three) times daily.    Marland Kitchen MONONESSA 0.25-35 MG-MCG tablet TAKE 1 TABLET BY MOUTH EVERY DAY  . norethindrone-ethinyl estradiol (MICROGESTIN,JUNEL,LOESTRIN) 1-20 MG-MCG tablet Take 1 tablet by mouth daily.  . Omega-3 Fatty Acids (OMEGA 3 PO) Take by mouth.    . SUMAtriptan (IMITREX) 100 MG tablet Take 100 mg by mouth as needed.    . traZODone (DESYREL) 50 MG tablet Take 1 tablet (50 mg total) by mouth at bedtime.  . [DISCONTINUED] DULoxetine (CYMBALTA) 30 MG capsule Take 3 daily  . [DISCONTINUED] traZODone (DESYREL) 50 MG tablet Take 1 tablet (50 mg total) by mouth at bedtime.  . [DISCONTINUED] zonisamide (ZONEGRAN) 100 MG capsule Take 100 mg by mouth daily.    No results found for this or any previous visit (from the past 2160 hour(s)).    Physical Exam: Constitutional:  BP 129/90  Pulse 96  Ht 5\' 8"  (1.727 m)  Wt 221 lb (100.245 kg)  BMI 33.61 kg/m2  Musculoskeletal: Strength & Muscle Tone: within normal limits Gait & Station: normal Patient leans: N/A  Mental Status Examination;  Patient is casually dressed and groomed.  She is mildly obese female and maintain good eye contact.  Her speech is  soft clear and coherent.  Her thought processes logical and goal-directed.  She describes her mood neutral and her affect is mood appropriate.  She denies any auditory or visual hallucination.  She denies any active or passive suicidal thoughts or homicidal thoughts.  There were no paranoia or delusions obsession  present at this time.  There were no flight of ideas or any loose associations present.  Her fund of knowledge is adequate.  She is alert and oriented x3.  There were no tremors or shakes present.  Her insight judgment and impulse control is okay.    Established Problem, Stable/Improving (1), Review of Last Therapy Session (1) and Review of New Medication or Change in Dosage (2)  Assessment: Axis I: Maj. depressive disorder, recurrent  Axis II: Deferred  Axis III:  Past Medical History  Diagnosis Date  . Migraines   . Kidney stones   . Depression   . ASCUS (atypical squamous cells of undetermined significance) on Pap smear     Axis IV: Mild   Plan:  Patient is doing better on her Cymbalta and trazodone.  Recommend to continue Cymbalta 90 mg daily and trazodone 50 mg at bedtime.  Recommend to call us back if she has any question or any concern.  Recommend to continue counseling for coping and social skills.  Followup in 3 months.   Joncarlo Friberg T., MD 05/06/2013

## 2013-05-13 ENCOUNTER — Ambulatory Visit (INDEPENDENT_AMBULATORY_CARE_PROVIDER_SITE_OTHER): Payer: 59 | Admitting: Licensed Clinical Social Worker

## 2013-05-13 DIAGNOSIS — IMO0002 Reserved for concepts with insufficient information to code with codable children: Secondary | ICD-10-CM

## 2013-05-20 ENCOUNTER — Ambulatory Visit (INDEPENDENT_AMBULATORY_CARE_PROVIDER_SITE_OTHER): Payer: 59 | Admitting: Licensed Clinical Social Worker

## 2013-05-20 DIAGNOSIS — IMO0002 Reserved for concepts with insufficient information to code with codable children: Secondary | ICD-10-CM

## 2013-06-03 ENCOUNTER — Ambulatory Visit (INDEPENDENT_AMBULATORY_CARE_PROVIDER_SITE_OTHER): Payer: 59 | Admitting: Licensed Clinical Social Worker

## 2013-06-03 DIAGNOSIS — IMO0002 Reserved for concepts with insufficient information to code with codable children: Secondary | ICD-10-CM

## 2013-06-19 ENCOUNTER — Ambulatory Visit (INDEPENDENT_AMBULATORY_CARE_PROVIDER_SITE_OTHER): Payer: 59 | Admitting: Licensed Clinical Social Worker

## 2013-06-19 DIAGNOSIS — IMO0002 Reserved for concepts with insufficient information to code with codable children: Secondary | ICD-10-CM

## 2013-07-10 ENCOUNTER — Ambulatory Visit: Payer: 59 | Admitting: Licensed Clinical Social Worker

## 2013-07-24 ENCOUNTER — Ambulatory Visit (INDEPENDENT_AMBULATORY_CARE_PROVIDER_SITE_OTHER): Payer: 59 | Admitting: Licensed Clinical Social Worker

## 2013-07-24 DIAGNOSIS — IMO0002 Reserved for concepts with insufficient information to code with codable children: Secondary | ICD-10-CM

## 2013-08-13 ENCOUNTER — Ambulatory Visit (INDEPENDENT_AMBULATORY_CARE_PROVIDER_SITE_OTHER): Payer: 59 | Admitting: Psychiatry

## 2013-08-13 ENCOUNTER — Encounter (HOSPITAL_COMMUNITY): Payer: Self-pay | Admitting: Psychiatry

## 2013-08-13 VITALS — BP 124/88 | HR 88 | Ht 68.0 in | Wt 221.0 lb

## 2013-08-13 DIAGNOSIS — F330 Major depressive disorder, recurrent, mild: Secondary | ICD-10-CM

## 2013-08-13 MED ORDER — TRAZODONE HCL 50 MG PO TABS: 50.0000 mg | ORAL_TABLET | Freq: Every day | ORAL | Status: DC

## 2013-08-13 MED ORDER — DULOXETINE HCL 30 MG PO CPEP: ORAL_CAPSULE | ORAL | Status: DC

## 2013-08-13 NOTE — Progress Notes (Signed)
[facility] Progress Note  [name] [id number] 38 y.o.  08/13/2013 2:56 PM  Chief Complaint:  Medication management and followup.  History of Present Illness:  [name] came for her followup appointment.  She and her husband recently visited [place] .  Patient told she had a good time and she spent 10 days in [place].  She is taking her medication as prescribed.  She is feeling much better and denies any recent crying spells, irritability, mood swings or any feeling of hopelessness or worthlessness.  Her appetite is good.  She is sleeping better.  Her headaches are less intense and less frequent.  She is seeing [name] at [facility] for counseling.  She is taking Cymbalta and trazodone.  She has no tremors or shakes.  She is hoping to participate in [place] in [place] in October.  Patient wants to continue her current psychotropic medication.  Her appetite is okay.  Her vitals are stable.  She lives with her husband was very supportive.  Suicidal Ideation: No Plan Formed: No Patient has means to carry out plan: No  Homicidal Ideation: No Plan Formed: No Patient has means to carry out plan: No  Medical History; Patient has history of migraine .    Past Psychiatric History/Hospitalization(s) Anxiety: Yes Bipolar Disorder: No Depression: Yes Mania: No Psychosis: No Schizophrenia: No Personality Disorder: No Hospitalization for psychiatric illness: No History of Electroconvulsive Shock Therapy: No Prior Suicide Attempts: No   Review of Systems: Psychiatric: Agitation: No Hallucination: No Depressed Mood: No Insomnia: No Hypersomnia: No Altered Concentration: No Feels Worthless: No Grandiose Ideas: No Belief In Special Powers: No New/Increased Substance Abuse: No Compulsions: No  Neurologic: Headache: No Seizure: No Paresthesias: No    Outpatient Encounter Prescriptions as of 08/13/2013  Medication Sig  . DULoxetine (CYMBALTA) 30  MG capsule Take 3 daily  . Magnesium 250 MG TABS Take 250 mg by mouth 3 (three) times daily.    Marland Kitchen MONONESSA 0.25-35 MG-MCG tablet TAKE 1 TABLET BY MOUTH EVERY DAY  . norethindrone-ethinyl estradiol (MICROGESTIN,JUNEL,LOESTRIN) 1-20 MG-MCG tablet Take 1 tablet by mouth daily.  . Omega-3 Fatty Acids (OMEGA 3 PO) Take by mouth.    . SUMAtriptan (IMITREX) 100 MG tablet Take 100 mg by mouth as needed.    . traZODone (DESYREL) 50 MG tablet Take 1 tablet (50 mg total) by mouth at bedtime.  Marland Kitchen zonisamide (ZONEGRAN) 100 MG capsule Take 150 mg by mouth daily.  . [DISCONTINUED] DULoxetine (CYMBALTA) 30 MG capsule Take 3 daily  . [DISCONTINUED] traZODone (DESYREL) 50 MG tablet Take 1 tablet (50 mg total) by mouth at bedtime.    No results found for this or any previous visit (from the past 2160 hour(s)).    Physical Exam: Constitutional:  BP 124/88  Pulse 88  Ht 5\' 8"  (1.727 m)  Wt 221 lb (100.245 kg)  BMI 33.61 kg/m2  Musculoskeletal: Strength & Muscle Tone: within normal limits Gait & Station: normal Patient leans: N/A  Mental Status Examination;  Patient is casually dressed and groomed.  She maintains good eye contact.  She is cooperative.  Her speech is soft clear and coherent.  Her thought processes logical and goal-directed.  She describes her mood good and her affect is mood appropriate.  She denies any auditory or visual hallucination.  She denies any active or passive suicidal thoughts or homicidal thoughts.  There were no paranoia or delusions obsession present at this time.  There were no flight of ideas or  any loose associations present.  Her fund of knowledge is adequate.  She is alert and oriented x3.  There were no tremors or shakes present.  Her insight judgment and impulse control is okay.    Established Problem, Stable/Improving (1), Review of Last Therapy Session (1) and Review of New Medication or Change in Dosage (2)  Assessment: Axis I: Maj. depressive disorder,  recurrent  Axis II: Deferred  Axis III:  Past Medical History  Diagnosis Date  . Migraines   . Kidney stones   . Depression   . ASCUS (atypical squamous cells of undetermined significance) on Pap smear     Axis IV: Mild   Plan:  Patient is doing better on her Cymbalta and trazodone.  Recommend to continue Cymbalta 90 mg daily and trazodone 50 mg at bedtime and continue to see Melinda Crutch for counseling.  Recommend to call us back if she has any question or any concern.  Followup in 3 months.   Abelino Tippin T., MD 08/13/2013

## 2013-08-26 ENCOUNTER — Ambulatory Visit (INDEPENDENT_AMBULATORY_CARE_PROVIDER_SITE_OTHER): Payer: 59 | Admitting: Licensed Clinical Social Worker

## 2013-08-26 DIAGNOSIS — IMO0002 Reserved for concepts with insufficient information to code with codable children: Secondary | ICD-10-CM

## 2013-09-16 ENCOUNTER — Ambulatory Visit (INDEPENDENT_AMBULATORY_CARE_PROVIDER_SITE_OTHER): Payer: 59 | Admitting: Gynecology

## 2013-09-16 ENCOUNTER — Encounter: Payer: Self-pay | Admitting: Gynecology

## 2013-09-16 VITALS — BP 124/76 | Ht 68.5 in | Wt 215.0 lb

## 2013-09-16 DIAGNOSIS — Z01419 Encounter for gynecological examination (general) (routine) without abnormal findings: Secondary | ICD-10-CM

## 2013-09-16 MED ORDER — NORETHINDRONE-ETH ESTRADIOL 1-35 MG-MCG PO TABS: 1.0000 | ORAL_TABLET | Freq: Every day | ORAL | Status: DC

## 2013-09-16 NOTE — Progress Notes (Signed)
[address]Lattie [name]" [name] 24-Jun-1975 [id number]        38 y.o.  G0P0 for annual exam.  Several issues that are below.  Past medical history,surgical history, problem list, medications, allergies, family history and social history were all reviewed and documented as reviewed in the EPIC chart.  ROS:  12 system ROS performed with pertinent positives and negatives included in the history, assessment and plan.   Additional significant findings :  None   Exam: [name] Vitals:   09/16/13 1448  BP: 124/76  Height: 5' 8.5" (1.74 m)  Weight: 215 lb (97.523 kg)   General appearance:  Normal affect, orientation and appearance. Skin: Grossly normal HEENT: Without gross lesions.  No cervical or supraclavicular adenopathy. Thyroid normal.  Lungs:  Clear without wheezing, rales or rhonchi Cardiac: RR, without RMG Abdominal:  Soft, nontender, without masses, guarding, rebound, organomegaly or hernia Breasts:  Examined lying and sitting without masses, retractions, discharge or axillary adenopathy. With bilateral well-healed reduction scars Pelvic:  Ext/BUS/vagina normal  Cervix normal  Uterus anteverted, normal size, shape and contour, midline and mobile nontender   Adnexa  Without masses or tenderness    Anus and perineum  Normal   Rectovaginal  Normal sphincter tone without palpated masses or tenderness.    Assessment/Plan:  38 y.o. G0P0 female for annual exam with breakthrough bleeding, oral contraceptives.   1. Breakthrough bleeding. Patient previously on Ortho-Novum 1/35 equivalents. Changed to LoLoEstrin last year for theoretical improvement in her thrombosis risk. She does have migraine headaches without aura. Had breakthrough bleeding with this and we increased her to Loestrin 120 equivalents. She continues to have breakthrough bleeding. I reviewed options with her to include trial of IUD. Alternatives to go back to the 135 also discussed. She's never smoked and is not being  followed for medical issues. Ultimately we both agree on the 135 she did well on these before and she clearly understands the issues and accepts the risks. 2. Mammography 2010. Reviewed screening mammographic recommendations between 8 and 40. Patient has no strong family history of breast cancer. She prefers to wait closer to 40. SBE monthly reviewed. 3. Pap smear 2013. No Pap smear done today. History of ASCUS times several with negative HPV in the past with intervening normal Pap smears x3. Plan repeat Pap smear next year a 3 year interval. 4. Health maintenance. Patient is actively seeing an internist following her cholesterol and has an appointment for a full exam and fasting blood work this coming fall. No blood work done today. Followup in one year, sooner as needed.   Note: This document was prepared with digital dictation and possible smart phrase technology. Any transcriptional errors that result from this process are unintentional.   Yolanda Auerbach MD, 3:28 PM 09/16/2013

## 2013-09-16 NOTE — Patient Instructions (Signed)
You may obtain a copy of any labs that were done today by logging onto MyChart as outlined in the instructions provided with your AVS (after visit summary). The office will not call with normal lab results but certainly if there are any significant abnormalities then we will contact you.   Health Maintenance, Female A healthy lifestyle and preventative care can promote health and wellness.  Maintain regular health, dental, and eye exams.  Eat a healthy diet. Foods like vegetables, fruits, whole grains, low-fat dairy products, and lean protein foods contain the nutrients you need without too many calories. Decrease your intake of foods high in solid fats, added sugars, and salt. Get information about a proper diet from your caregiver, if necessary.  Regular physical exercise is one of the most important things you can do for your health. Most adults should get at least 150 minutes of moderate-intensity exercise (any activity that increases your heart rate and causes you to sweat) each week. In addition, most adults need muscle-strengthening exercises on 2 or more days a week.   Maintain a healthy weight. The body mass index (BMI) is a screening tool to identify possible weight problems. It provides an estimate of body fat based on height and weight. Your caregiver can help determine your BMI, and can help you achieve or maintain a healthy weight. For adults 20 years and older:  A BMI below 18.5 is considered underweight.  A BMI of 18.5 to 24.9 is normal.  A BMI of 25 to 29.9 is considered overweight.  A BMI of 30 and above is considered obese.  Maintain normal blood lipids and cholesterol by exercising and minimizing your intake of saturated fat. Eat a balanced diet with plenty of fruits and vegetables. Blood tests for lipids and cholesterol should begin at age 61 and be repeated every 5 years. If your lipid or cholesterol levels are high, you are over 50, or you are a high risk for heart  disease, you may need your cholesterol levels checked more frequently.Ongoing high lipid and cholesterol levels should be treated with medicines if diet and exercise are not effective.  If you smoke, find out from your caregiver how to quit. If you do not use tobacco, do not start.  Lung cancer screening is recommended for adults aged 33 80 years who are at high risk for developing lung cancer because of a history of smoking. Yearly low-dose computed tomography (CT) is recommended for people who have at least a 30-pack-year history of smoking and are a current smoker or have quit within the past 15 years. A pack year of smoking is smoking an average of 1 pack of cigarettes a day for 1 year (for example: 1 pack a day for 30 years or 2 packs a day for 15 years). Yearly screening should continue until the smoker has stopped smoking for at least 15 years. Yearly screening should also be stopped for people who develop a health problem that would prevent them from having lung cancer treatment.  If you are pregnant, do not drink alcohol. If you are breastfeeding, be very cautious about drinking alcohol. If you are not pregnant and choose to drink alcohol, do not exceed 1 drink per day. One drink is considered to be 12 ounces (355 mL) of beer, 5 ounces (148 mL) of wine, or 1.5 ounces (44 mL) of liquor.  Avoid use of street drugs. Do not share needles with anyone. Ask for help if you need support or instructions about stopping  the use of drugs.  High blood pressure causes heart disease and increases the risk of stroke. Blood pressure should be checked at least every 1 to 2 years. Ongoing high blood pressure should be treated with medicines, if weight loss and exercise are not effective.  If you are 59 to 38 years old, ask your caregiver if you should take aspirin to prevent strokes.  Diabetes screening involves taking a blood sample to check your fasting blood sugar level. This should be done once every 3  years, after age 91, if you are within normal weight and without risk factors for diabetes. Testing should be considered at a younger age or be carried out more frequently if you are overweight and have at least 1 risk factor for diabetes.  Breast cancer screening is essential preventative care for women. You should practice "breast self-awareness." This means understanding the normal appearance and feel of your breasts and may include breast self-examination. Any changes detected, no matter how small, should be reported to a caregiver. Women in their 66s and 30s should have a clinical breast exam (CBE) by a caregiver as part of a regular health exam every 1 to 3 years. After age 101, women should have a CBE every year. Starting at age 100, women should consider having a mammogram (breast X-ray) every year. Women who have a family history of breast cancer should talk to their caregiver about genetic screening. Women at a high risk of breast cancer should talk to their caregiver about having an MRI and a mammogram every year.  Breast cancer gene (BRCA)-related cancer risk assessment is recommended for women who have family members with BRCA-related cancers. BRCA-related cancers include breast, ovarian, tubal, and peritoneal cancers. Having family members with these cancers may be associated with an increased risk for harmful changes (mutations) in the breast cancer genes BRCA1 and BRCA2. Results of the assessment will determine the need for genetic counseling and BRCA1 and BRCA2 testing.  The Pap test is a screening test for cervical cancer. Women should have a Pap test starting at age 57. Between ages 25 and 35, Pap tests should be repeated every 2 years. Beginning at age 37, you should have a Pap test every 3 years as long as the past 3 Pap tests have been normal. If you had a hysterectomy for a problem that was not cancer or a condition that could lead to cancer, then you no longer need Pap tests. If you are  between ages 50 and 76, and you have had normal Pap tests going back 10 years, you no longer need Pap tests. If you have had past treatment for cervical cancer or a condition that could lead to cancer, you need Pap tests and screening for cancer for at least 20 years after your treatment. If Pap tests have been discontinued, risk factors (such as a new sexual partner) need to be reassessed to determine if screening should be resumed. Some women have medical problems that increase the chance of getting cervical cancer. In these cases, your caregiver may recommend more frequent screening and Pap tests.  The human papillomavirus (HPV) test is an additional test that may be used for cervical cancer screening. The HPV test looks for the virus that can cause the cell changes on the cervix. The cells collected during the Pap test can be tested for HPV. The HPV test could be used to screen women aged 44 years and older, and should be used in women of any age  who have unclear Pap test results. After the age of 55, women should have HPV testing at the same frequency as a Pap test.  Colorectal cancer can be detected and often prevented. Most routine colorectal cancer screening begins at the age of 44 and continues through age 20. However, your caregiver may recommend screening at an earlier age if you have risk factors for colon cancer. On a yearly basis, your caregiver may provide home test kits to check for hidden blood in the stool. Use of a small camera at the end of a tube, to directly examine the colon (sigmoidoscopy or colonoscopy), can detect the earliest forms of colorectal cancer. Talk to your caregiver about this at age 86, when routine screening begins. Direct examination of the colon should be repeated every 5 to 10 years through age 13, unless early forms of pre-cancerous polyps or small growths are found.  Hepatitis C blood testing is recommended for all people born from [birth year] through 1965 and any  individual with known risks for hepatitis C.  Practice safe sex. Use condoms and avoid high-risk sexual practices to reduce the spread of sexually transmitted infections (STIs). Sexually active women aged 36 and younger should be checked for Chlamydia, which is a common sexually transmitted infection. Older women with new or multiple partners should also be tested for Chlamydia. Testing for other STIs is recommended if you are sexually active and at increased risk.  Osteoporosis is a disease in which the bones lose minerals and strength with aging. This can result in serious bone fractures. The risk of osteoporosis can be identified using a bone density scan. Women ages 20 and over and women at risk for fractures or osteoporosis should discuss screening with their caregivers. Ask your caregiver whether you should be taking a calcium supplement or vitamin D to reduce the rate of osteoporosis.  Menopause can be associated with physical symptoms and risks. Hormone replacement therapy is available to decrease symptoms and risks. You should talk to your caregiver about whether hormone replacement therapy is right for you.  Use sunscreen. Apply sunscreen liberally and repeatedly throughout the day. You should seek shade when your shadow is shorter than you. Protect yourself by wearing long sleeves, pants, a wide-brimmed hat, and sunglasses year round, whenever you are outdoors.  Notify your caregiver of new moles or changes in moles, especially if there is a change in shape or color. Also notify your caregiver if a mole is larger than the size of a pencil eraser.  Stay current with your immunizations. Document Released: 07/26/2010 Document Revised: 05/07/2012 Document Reviewed: 07/26/2010 [facility] Patient Information 2014 [employer].

## 2013-09-17 LAB — URINALYSIS W MICROSCOPIC + REFLEX CULTURE
Bacteria, UA: NONE SEEN
Bilirubin Urine: NEGATIVE
Casts: NONE SEEN
Crystals: NONE SEEN
Glucose, UA: NEGATIVE mg/dL
Hgb urine dipstick: NEGATIVE
Ketones, ur: NEGATIVE mg/dL
LEUKOCYTES UA: NEGATIVE
Nitrite: NEGATIVE
PROTEIN: NEGATIVE mg/dL
Specific Gravity, Urine: 1.025 (ref 1.005–1.030)
UROBILINOGEN UA: 0.2 mg/dL (ref 0.0–1.0)
pH: 6 (ref 5.0–8.0)

## 2013-09-23 ENCOUNTER — Ambulatory Visit (INDEPENDENT_AMBULATORY_CARE_PROVIDER_SITE_OTHER): Payer: 59 | Admitting: Licensed Clinical Social Worker

## 2013-09-23 DIAGNOSIS — IMO0002 Reserved for concepts with insufficient information to code with codable children: Secondary | ICD-10-CM

## 2013-10-28 ENCOUNTER — Ambulatory Visit (INDEPENDENT_AMBULATORY_CARE_PROVIDER_SITE_OTHER): Payer: 59 | Admitting: Licensed Clinical Social Worker

## 2013-10-28 DIAGNOSIS — F3341 Major depressive disorder, recurrent, in partial remission: Secondary | ICD-10-CM

## 2013-11-12 ENCOUNTER — Encounter (HOSPITAL_COMMUNITY): Payer: Self-pay | Admitting: Psychiatry

## 2013-11-12 ENCOUNTER — Ambulatory Visit (INDEPENDENT_AMBULATORY_CARE_PROVIDER_SITE_OTHER): Payer: 59 | Admitting: Psychiatry

## 2013-11-12 VITALS — BP 129/89 | HR 67 | Ht 68.5 in | Wt 209.0 lb

## 2013-11-12 DIAGNOSIS — F332 Major depressive disorder, recurrent severe without psychotic features: Secondary | ICD-10-CM

## 2013-11-12 DIAGNOSIS — F330 Major depressive disorder, recurrent, mild: Secondary | ICD-10-CM

## 2013-11-12 MED ORDER — DULOXETINE HCL 30 MG PO CPEP: ORAL_CAPSULE | ORAL | Status: DC

## 2013-11-12 MED ORDER — TRAZODONE HCL 50 MG PO TABS: 50.0000 mg | ORAL_TABLET | Freq: Every day | ORAL | Status: DC

## 2013-11-12 NOTE — Progress Notes (Signed)
[facility] Progress Note  [name] [id number] 38 y.o.  11/12/2013 3:08 PM  Chief Complaint:  Medication management and followup.  History of Present Illness:  [name] came for her followup appointment.  She recently participated in [place] which was 26 miles.  She feels happy about it.  Overall she is feeling better .  She denies any irritability, crying spells, feelings of hopelessness or worthlessness.  Her energy level is good.  She is taking Cymbalta and trazodone.  She denies any side effects of medication.  Her headaches are less intense and less frequent.  She has no tremors or shakes.  Her appetite is okay.  Her vitals are stable.  She is seeing [name] at [facility] for counseling. She lives with her husband was very supportive.  Suicidal Ideation: No Plan Formed: No Patient has means to carry out plan: No  Homicidal Ideation: No Plan Formed: No Patient has means to carry out plan: No  Medical History; Patient has history of migraine .    Past Psychiatric History/Hospitalization(s) Anxiety: Yes Bipolar Disorder: No Depression: Yes Mania: No Psychosis: No Schizophrenia: No Personality Disorder: No Hospitalization for psychiatric illness: No History of Electroconvulsive Shock Therapy: No Prior Suicide Attempts: No   Review of Systems: Psychiatric: Agitation: No Hallucination: No Depressed Mood: No Insomnia: No Hypersomnia: No Altered Concentration: No Feels Worthless: No Grandiose Ideas: No Belief In Special Powers: No New/Increased Substance Abuse: No Compulsions: No  Neurologic: Headache: No Seizure: No Paresthesias: No    Outpatient Encounter Prescriptions as of 11/12/2013  Medication Sig  . DULoxetine (CYMBALTA) 30 MG capsule Take 3 daily  . Magnesium 250 MG TABS Take 250 mg by mouth 3 (three) times daily.    . norethindrone-ethinyl estradiol (MICROGESTIN,JUNEL,LOESTRIN) 1-20 MG-MCG tablet Take 1  tablet by mouth daily.  . Omega-3 Fatty Acids (OMEGA 3 PO) Take by mouth.    . SUMAtriptan (IMITREX) 100 MG tablet Take 100 mg by mouth as needed.    . traZODone (DESYREL) 50 MG tablet Take 1 tablet (50 mg total) by mouth at bedtime.  Marland Kitchen zonisamide (ZONEGRAN) 100 MG capsule Take 150 mg by mouth daily.  . [DISCONTINUED] DULoxetine (CYMBALTA) 30 MG capsule Take 3 daily  . [DISCONTINUED] norethindrone-ethinyl estradiol 1/35 (ORTHO-NOVUM, NORTREL,CYCLAFEM) tablet Take 1 tablet by mouth daily.  . [DISCONTINUED] traZODone (DESYREL) 50 MG tablet Take 1 tablet (50 mg total) by mouth at bedtime.    Recent Results (from the past 2160 hour(s))  URINALYSIS W MICROSCOPIC + REFLEX CULTURE     Status: None   Collection Time    09/16/13  3:37 PM      Result Value Ref Range   Color, Urine YELLOW  YELLOW   APPearance CLEAR  CLEAR   Specific Gravity, Urine 1.025  1.005 - 1.030   pH 6.0  5.0 - 8.0   Glucose, UA NEG  NEG mg/dL   Bilirubin Urine NEG  NEG   Ketones, ur NEG  NEG mg/dL   Hgb urine dipstick NEG  NEG   Protein, ur NEG  NEG mg/dL   Urobilinogen, UA 0.2  0.0 - 1.0 mg/dL   Nitrite NEG  NEG   Leukocytes, UA NEG  NEG   Squamous Epithelial / LPF RARE  RARE   Crystals NONE SEEN  NONE SEEN   Casts NONE SEEN  NONE SEEN   WBC, UA 0-2  <3 WBC/hpf   RBC / HPF 0-2  <3 RBC/hpf   Bacteria, UA NONE  SEEN  RARE      Physical Exam: Constitutional:  BP 129/89  Pulse 67  Ht 5' 8.5" (1.74 m)  Wt 209 lb (94.802 kg)  BMI 31.31 kg/m2  Musculoskeletal: Strength & Muscle Tone: within normal limits Gait & Station: normal Patient leans: N/A  Mental Status Examination;  Patient is casually dressed and groomed.  She maintains good eye contact.  She is cooperative.  Her speech is soft clear and coherent.  Her thought processes logical and goal-directed.  She describes her mood good and her affect is mood appropriate.  She denies any auditory or visual hallucination.  She denies any active or passive suicidal  thoughts or homicidal thoughts.  There were no paranoia or delusions obsession present at this time.  There were no flight of ideas or any loose associations present.  Her fund of knowledge is adequate.  She is alert and oriented x3.  There were no tremors or shakes present.  Her insight judgment and impulse control is okay.    Established Problem, Stable/Improving (1), Review of Last Therapy Session (1) and Review of New Medication or Change in Dosage (2)  Assessment: Axis I: Maj. depressive disorder, recurrent  Axis II: Deferred  Axis III:  Past Medical History  Diagnosis Date  . Migraines   . Kidney stones   . Depression   . ASCUS (atypical squamous cells of undetermined significance) on Pap smear     Axis IV: Mild   Plan:  Patient is doing better on her Cymbalta and trazodone.  Recommend to continue Cymbalta 90 mg daily, trazodone 50 mg at bedtime and continue to see [name] for counseling.  Recommend to call us back if she has any question or any concern.  Followup in 3 months.   Guiselle Mian T., MD 11/12/2013

## 2014-02-12 ENCOUNTER — Ambulatory Visit (INDEPENDENT_AMBULATORY_CARE_PROVIDER_SITE_OTHER): Payer: 59 | Admitting: Psychiatry

## 2014-02-12 ENCOUNTER — Encounter (HOSPITAL_COMMUNITY): Payer: Self-pay | Admitting: Psychiatry

## 2014-02-12 VITALS — BP 140/85 | HR 71 | Ht 68.0 in | Wt 219.4 lb

## 2014-02-12 DIAGNOSIS — F339 Major depressive disorder, recurrent, unspecified: Secondary | ICD-10-CM

## 2014-02-12 DIAGNOSIS — F330 Major depressive disorder, recurrent, mild: Secondary | ICD-10-CM

## 2014-02-12 MED ORDER — TRAZODONE HCL 50 MG PO TABS: 50.0000 mg | ORAL_TABLET | Freq: Every day | ORAL | Status: DC

## 2014-02-12 MED ORDER — DULOXETINE HCL 30 MG PO CPEP: ORAL_CAPSULE | ORAL | Status: DC

## 2014-02-12 NOTE — Progress Notes (Signed)
[name]  [name] [id number] 39 y.o.  02/12/2014 3:15 PM  Chief Complaint:  Medication management and followup.  History of Present Illness:  [name] came for her followup appointment.  She has been feeling sad and depressed because she does not like the holidays but now she is going back to her routine and hoping to things get better.  She is taking her medication and denies any side effects.  She has noticed increase in her headaches and recently her physician increase the dose of Zonegran.  She had blood work and her cholesterol remains high but she was not given any medication for cholesterol.  Patient told she has no other risk factor and her physician does not feel necessary to prescribe cholesterol-lowering medication.  She had to start running and now hoping to go back to her normal routine since holidays are over.  Her husband was working on the Christmas .  Her appetite is okay.  Her vitals are stable.  She is seeing [name] at [facility] for counseling. She lives with her husband was very supportive.  Suicidal Ideation: No Plan Formed: No Patient has means to carry out plan: No  Homicidal Ideation: No Plan Formed: No Patient has means to carry out plan: No  Medical History; Patient has history of migraine .    Past Psychiatric History/Hospitalization(s) Anxiety: Yes Bipolar Disorder: No Depression: Yes Mania: No Psychosis: No Schizophrenia: No Personality Disorder: No Hospitalization for psychiatric illness: No History of Electroconvulsive Shock Therapy: No Prior Suicide Attempts: No   Review of Systems: Psychiatric: Agitation: No Hallucination: No Depressed Mood: No Insomnia: No Hypersomnia: No Altered Concentration: No Feels Worthless: No Grandiose Ideas: No Belief In Special Powers: No New/Increased Substance Abuse: No Compulsions: No  Neurologic: Headache: No Seizure: No Paresthesias:  No    Outpatient Encounter Prescriptions as of 02/12/2014  Medication Sig  . zonisamide (ZONEGRAN) 100 MG capsule Take 250 mg by mouth daily.  . DULoxetine (CYMBALTA) 30 MG capsule Take 3 daily  . Magnesium 250 MG TABS Take 250 mg by mouth 3 (three) times daily.    . norethindrone-ethinyl estradiol (MICROGESTIN,JUNEL,LOESTRIN) 1-20 MG-MCG tablet Take 1 tablet by mouth daily.  . Omega-3 Fatty Acids (OMEGA 3 PO) Take by mouth.    . SUMAtriptan (IMITREX) 100 MG tablet Take 100 mg by mouth as needed.    . traZODone (DESYREL) 50 MG tablet Take 1 tablet (50 mg total) by mouth at bedtime.  . [DISCONTINUED] DULoxetine (CYMBALTA) 30 MG capsule Take 3 daily  . [DISCONTINUED] traZODone (DESYREL) 50 MG tablet Take 1 tablet (50 mg total) by mouth at bedtime.  . [DISCONTINUED] zonisamide (ZONEGRAN) 100 MG capsule Take 150 mg by mouth daily.    No results found for this or any previous visit (from the past 2160 hour(s)).    Physical Exam: Constitutional:  BP 140/85 mmHg  Pulse 71  Ht 5\' 8"  (1.727 m)  Wt 219 lb 6.4 oz (99.519 kg)  BMI 33.37 kg/m2  Musculoskeletal: Strength & Muscle Tone: within normal limits Gait & Station: normal Patient leans: N/A  Mental Status Examination;  Patient is casually dressed and groomed.  She maintains good eye contact.  She is cooperative.  Her speech is soft clear and coherent.  Her thought processes logical and goal-directed.  She describes her mood euthymic and her affect is constricted. She denies any auditory or visual hallucination.  She denies any active or passive suicidal thoughts or homicidal thoughts.  There were no paranoia or delusions obsession present at this time.  There were no flight of ideas or any loose associations present.  Her fund of knowledge is adequate.  She is alert and oriented x3.  There were no tremors or shakes present.  Her insight judgment and impulse control is okay.    Established Problem, Stable/Improving (1), Review of Last  Therapy Session (1) and Review of New Medication or Change in Dosage (2)  Assessment: Axis I: Maj. depressive disorder, recurrent  Axis II: Deferred  Axis III:  Past Medical History  Diagnosis Date  . Migraines   . Kidney stones   . Depression   . ASCUS (atypical squamous cells of undetermined significance) on Pap smear     Axis IV: Mild   Plan:  Reassurance given.  Recommended to keep appointment with her therapist for counseling.  She does not want to change her medication.  I will continue Cymbalta 90 mg daily and trazodone 50 mg at bedtime.  Recommend to call us back if she has any question or any concern.  Followup in 3 months.   Vernica Wachtel T., MD 02/12/2014

## 2014-05-14 ENCOUNTER — Ambulatory Visit (INDEPENDENT_AMBULATORY_CARE_PROVIDER_SITE_OTHER): Payer: 59 | Admitting: Psychiatry

## 2014-05-14 ENCOUNTER — Encounter (HOSPITAL_COMMUNITY): Payer: Self-pay | Admitting: Psychiatry

## 2014-05-14 VITALS — BP 119/83 | HR 97 | Ht 68.0 in | Wt 215.8 lb

## 2014-05-14 DIAGNOSIS — F330 Major depressive disorder, recurrent, mild: Secondary | ICD-10-CM

## 2014-05-14 MED ORDER — TRAZODONE HCL 50 MG PO TABS: 50.0000 mg | ORAL_TABLET | Freq: Every day | ORAL | Status: DC

## 2014-05-14 MED ORDER — DULOXETINE HCL 30 MG PO CPEP: ORAL_CAPSULE | ORAL | Status: DC

## 2014-05-14 NOTE — Progress Notes (Signed)
[facility] Progress Note  Yolanda BocchinoMAYCEL [name] [id number] 39 y.o.  05/14/2014 2:03 PM  Chief Complaint:  Medication management and followup.  History of Present Illness:  [name] came for her followup appointment.  She is taking her medication as prescribed.  There are times that she has difficulty keeping her sleep despite trazodone but mostly she has no other concerns or side effects.  Her depression is stable.  She continued to engage in exercise, walking, bike and running.  She is planning to run a marathon at the end of May her energy level is good.  Last month she had frequent episodes of headache and she required headache medicine.  Patient denies any shakes, tremors, EPS or any other concerns with Cymbalta.  She denies any crying spells, irritability or any feeling of hopelessness or worthlessness.  She is seeing [name] for counseling .  Patient denies drinking or using any illegal substances.  Her appetite is okay.  Her vitals are stable.  Patient lives with her husband is very supportive.  Suicidal Ideation: No Plan Formed: No Patient has means to carry out plan: No  Homicidal Ideation: No Plan Formed: No Patient has means to carry out plan: No  Medical History; Patient has history of migraine .    Past Psychiatric History/Hospitalization(s) Anxiety: Yes Bipolar Disorder: No Depression: Yes Mania: No Psychosis: No Schizophrenia: No Personality Disorder: No Hospitalization for psychiatric illness: No History of Electroconvulsive Shock Therapy: No Prior Suicide Attempts: No   Review of Systems: Psychiatric: Agitation: No Hallucination: No Depressed Mood: No Insomnia: No Hypersomnia: No Altered Concentration: No Feels Worthless: No Grandiose Ideas: No Belief In Special Powers: No New/Increased Substance Abuse: No Compulsions: No  Neurologic: Headache: No Seizure: No Paresthesias: No    Outpatient Encounter Prescriptions as  of 05/14/2014  Medication Sig  . DULoxetine (CYMBALTA) 30 MG capsule Take 3 daily  . Magnesium 250 MG TABS Take 250 mg by mouth 3 (three) times daily.    . norethindrone-ethinyl estradiol (MICROGESTIN,JUNEL,LOESTRIN) 1-20 MG-MCG tablet Take 1 tablet by mouth daily.  . Omega-3 Fatty Acids (OMEGA 3 PO) Take by mouth.    . SUMAtriptan (IMITREX) 100 MG tablet Take 100 mg by mouth as needed.    . traZODone (DESYREL) 50 MG tablet Take 1 tablet (50 mg total) by mouth at bedtime.  Marland Kitchen zonisamide (ZONEGRAN) 100 MG capsule Take 250 mg by mouth daily.  . [DISCONTINUED] DULoxetine (CYMBALTA) 30 MG capsule Take 3 daily  . [DISCONTINUED] traZODone (DESYREL) 50 MG tablet Take 1 tablet (50 mg total) by mouth at bedtime.    No results found for this or any previous visit (from the past 2160 hour(s)).    Physical Exam: Constitutional:  BP 119/83 mmHg  Pulse 97  Ht 5\' 8"  (1.727 m)  Wt 215 lb 12.8 oz (97.886 kg)  BMI 32.82 kg/m2  LMP 04/28/2014 (Approximate)  Musculoskeletal: Strength & Muscle Tone: within normal limits Gait & Station: normal Patient leans: N/A  Mental Status Examination;  Patient is casually dressed and groomed.  She maintains good eye contact.  She is pleasant and cooperative.  Her speech is soft clear and coherent.  Her thought processes logical and goal-directed.  Her attention and concentration is good.  She describes her mood euthymic and her affect is constricted. She denies any auditory or visual hallucination.  She denies any active or passive suicidal thoughts or homicidal thoughts.  There were no paranoia or delusions obsession present at this time.  There were no flight of ideas or any loose associations present.  Her fund of knowledge is adequate.  She is alert and oriented x3.  There were no tremors or shakes present.  Her insight judgment and impulse control is okay.    Established Problem, Stable/Improving (1), Review of Last Therapy Session (1) and Review of New  Medication or Change in Dosage (2)  Assessment: Axis I: Maj. depressive disorder, recurrent  Axis II: Deferred  Axis III:  Past Medical History  Diagnosis Date  . Migraines   . Kidney stones   . Depression   . ASCUS (atypical squamous cells of undetermined significance) on Pap smear    Plan:  Patient is a stable on her current medication.  I will continue Cymbalta 90 mg daily and trazodone 50 mg at bedtime.  Recommended to continue counseling with [name]. Recommend to call us back if she has any question or any concern.  Followup in 3 months.   Loran Fleet T., MD 05/14/2014

## 2014-06-03 ENCOUNTER — Telehealth: Payer: Self-pay | Admitting: *Deleted

## 2014-06-03 ENCOUNTER — Other Ambulatory Visit: Payer: Self-pay

## 2014-06-03 MED ORDER — NORETHINDRONE-ETH ESTRADIOL 1-35 MG-MCG PO TABS: ORAL_TABLET | ORAL | Status: DC

## 2014-06-03 NOTE — Telephone Encounter (Signed)
Yes. Is considered offbrand labeling in that the company is not telling her to do this but there is no reason that it should not work.

## 2014-06-03 NOTE — Telephone Encounter (Signed)
Left message for pt to call.

## 2014-06-03 NOTE — Telephone Encounter (Signed)
Pt informed

## 2014-06-03 NOTE — Telephone Encounter (Signed)
Pt currently takes Ortho-Novum 1/35 asked if she can start skipping placebo pills and take active pills? Pt said every month during placebo week she get migraine headaches. Annual due in August. Please advise

## 2014-08-13 ENCOUNTER — Encounter (HOSPITAL_COMMUNITY): Payer: Self-pay | Admitting: Psychiatry

## 2014-08-13 ENCOUNTER — Ambulatory Visit (INDEPENDENT_AMBULATORY_CARE_PROVIDER_SITE_OTHER): Payer: 59 | Admitting: Psychiatry

## 2014-08-13 VITALS — BP 112/82 | HR 94 | Ht 68.0 in | Wt 219.4 lb

## 2014-08-13 DIAGNOSIS — F339 Major depressive disorder, recurrent, unspecified: Secondary | ICD-10-CM | POA: Diagnosis not present

## 2014-08-13 DIAGNOSIS — F330 Major depressive disorder, recurrent, mild: Secondary | ICD-10-CM

## 2014-08-13 MED ORDER — TRAZODONE HCL 50 MG PO TABS: 50.0000 mg | ORAL_TABLET | Freq: Every day | ORAL | Status: DC

## 2014-08-13 MED ORDER — DULOXETINE HCL 30 MG PO CPEP: ORAL_CAPSULE | ORAL | Status: DC

## 2014-08-13 NOTE — Progress Notes (Signed)
[facility] Progress Note  Oria KlimasEDYN POPOCA [id number] 39 y.o.  08/13/2014 4:22 PM  Chief Complaint:  Medication management and followup.  History of Present Illness:  [name] came for her followup appointment.  She is compliant with her medication and she is relieved that her headaches are less intense and less frequent.  She started taking her oral contraception pills every day and that help her headaches.  She reported her depression is under control and she likes Cymbalta and trazodone.  She denies any side effects including any tremors, shakes or any EPS.  Her attention concentration is okay.  Her energy level is good.  Lately she was very busy with her husband who travel for a bike race for almost 3000 miles.  Patient was a Museum/gallery exhibitions officer .  She is excited about upcoming marathon in October at [place].  Overall patient is feeling good on medication.  She denies any feeling of hopelessness or worthlessness.  She continues to see Melinda Crutch for counseling.  She is going to see her primary care physician in few weeks for physical checkup and blood work.  Patient denies drinking or using any illegal substances.  Her appetite is okay.  Her vitals are stable. Patient lives with her husband is very supportive.  Suicidal Ideation: No Plan Formed: No Patient has means to carry out plan: No  Homicidal Ideation: No Plan Formed: No Patient has means to carry out plan: No  Medical History; Patient has history of migraine .    Past Psychiatric History/Hospitalization(s) Anxiety: Yes Bipolar Disorder: No Depression: Yes Mania: No Psychosis: No Schizophrenia: No Personality Disorder: No Hospitalization for psychiatric illness: No History of Electroconvulsive Shock Therapy: No Prior Suicide Attempts: No   Review of Systems: Psychiatric: Agitation: No Hallucination: No Depressed Mood: No Insomnia: No Hypersomnia: No Altered Concentration: No Feels  Worthless: No Grandiose Ideas: No Belief In Special Powers: No New/Increased Substance Abuse: No Compulsions: No  Neurologic: Headache: No Seizure: No Paresthesias: No    Outpatient Encounter Prescriptions as of 08/13/2014  Medication Sig  . DULoxetine (CYMBALTA) 30 MG capsule Take 3 daily  . [DISCONTINUED] DULoxetine (CYMBALTA) 30 MG capsule Take 3 daily  . Magnesium 250 MG TABS Take 250 mg by mouth 3 (three) times daily.    . norethindrone-ethinyl estradiol 1/35 (ORTHO-NOVUM, NORTREL,CYCLAFEM) tablet Patient takes active pills only skipping placebo pills  . Omega-3 Fatty Acids (OMEGA 3 PO) Take by mouth.    . SUMAtriptan (IMITREX) 100 MG tablet Take 100 mg by mouth as needed.    . traZODone (DESYREL) 50 MG tablet Take 1 tablet (50 mg total) by mouth at bedtime.  Marland Kitchen zonisamide (ZONEGRAN) 100 MG capsule Take 250 mg by mouth daily.  . [DISCONTINUED] traZODone (DESYREL) 50 MG tablet Take 1 tablet (50 mg total) by mouth at bedtime.   No facility-administered encounter medications on file as of 08/13/2014.    No results found for this or any previous visit (from the past 2160 hour(s)).    Physical Exam: Constitutional:  BP 112/82 mmHg  Pulse 94  Ht 5\' 8"  (1.727 m)  Wt 219 lb 6.4 oz (99.519 kg)  BMI 33.37 kg/m2  LMP 07/30/2014  Musculoskeletal: Strength & Muscle Tone: within normal limits Gait & Station: normal Patient leans: N/A  Mental Status Examination;  Patient is casually dressed and groomed.  She maintains good eye contact.  She is pleasant and cooperative.  Her speech is soft clear and coherent.  Her thought processes logical  and goal-directed.  Her attention and concentration is good.  She describes her mood euthymic and her affect is appropriate.  She denies any auditory or visual hallucination.  She denies any active or passive suicidal thoughts or homicidal thoughts.  There were no paranoia or delusions obsession present at this time.  There were no flight of ideas  or any loose associations present.  Her fund of knowledge is adequate.  She is alert and oriented x3.  There were no tremors or shakes present.  Her insight judgment and impulse control is okay.    Established Problem, Stable/Improving (1), Review of Last Therapy Session (1) and Review of Medication Regimen & Side Effects (2)  Assessment: Axis I: Maj. depressive disorder, recurrent  Axis II: Deferred  Axis III:  Past Medical History  Diagnosis Date  . Migraines   . Kidney stones   . Depression   . ASCUS (atypical squamous cells of undetermined significance) on Pap smear    Plan:  Patient is a stable on her current medication.  I will continue Cymbalta 90 mg daily and trazodone 50 mg at bedtime.  Recommended to continue counseling with [name]. Recommend to call us back if she has any question or any concern.  Followup in 3 months.   Semir Brill T., MD 08/13/2014

## 2014-08-22 ENCOUNTER — Ambulatory Visit
Admission: RE | Admit: 2014-08-22 | Discharge: 2014-08-22 | Disposition: A | Discharge status: Home / Self Care | Payer: 59 | Source: Ambulatory Visit | Attending: Internal Medicine | Admitting: Internal Medicine

## 2014-08-22 ENCOUNTER — Other Ambulatory Visit: Payer: Self-pay | Admitting: Internal Medicine

## 2014-08-22 DIAGNOSIS — M5430 Sciatica, unspecified side: Secondary | ICD-10-CM

## 2014-09-15 ENCOUNTER — Other Ambulatory Visit: Payer: Self-pay

## 2014-09-15 MED ORDER — NORETHINDRONE-ETH ESTRADIOL 1-35 MG-MCG PO TABS: ORAL_TABLET | ORAL | Status: DC

## 2014-09-18 ENCOUNTER — Encounter: Payer: 59 | Admitting: Gynecology

## 2014-09-22 ENCOUNTER — Telehealth: Payer: Self-pay

## 2014-09-22 ENCOUNTER — Ambulatory Visit (INDEPENDENT_AMBULATORY_CARE_PROVIDER_SITE_OTHER): Payer: 59 | Admitting: Gynecology

## 2014-09-22 ENCOUNTER — Encounter: Payer: Self-pay | Admitting: Gynecology

## 2014-09-22 ENCOUNTER — Other Ambulatory Visit (HOSPITAL_COMMUNITY)
Admission: RE | Admit: 2014-09-22 | Discharge: 2014-09-22 | Disposition: A | Discharge status: Home / Self Care | Payer: 59 | Source: Ambulatory Visit | Attending: Gynecology | Admitting: Gynecology

## 2014-09-22 ENCOUNTER — Other Ambulatory Visit: Payer: Self-pay | Admitting: Gynecology

## 2014-09-22 VITALS — BP 120/76 | Ht 68.0 in | Wt 220.0 lb

## 2014-09-22 DIAGNOSIS — Z01419 Encounter for gynecological examination (general) (routine) without abnormal findings: Secondary | ICD-10-CM

## 2014-09-22 DIAGNOSIS — Z1151 Encounter for screening for human papillomavirus (HPV): Secondary | ICD-10-CM | POA: Diagnosis present

## 2014-09-22 DIAGNOSIS — R21 Rash and other nonspecific skin eruption: Secondary | ICD-10-CM | POA: Diagnosis not present

## 2014-09-22 MED ORDER — BETAMETHASONE DIPROPIONATE AUG 0.05 % EX CREA: TOPICAL_CREAM | Freq: Two times a day (BID) | CUTANEOUS | Status: DC

## 2014-09-22 MED ORDER — DOXYCYCLINE HYCLATE 100 MG PO CAPS: 100.0000 mg | ORAL_CAPSULE | Freq: Two times a day (BID) | ORAL | Status: DC

## 2014-09-22 MED ORDER — NORETHINDRONE-ETH ESTRADIOL 1-35 MG-MCG PO TABS: ORAL_TABLET | ORAL | Status: DC

## 2014-09-22 MED ORDER — DOXYCYCLINE HYCLATE 100 MG PO TBEC: 100.0000 mg | DELAYED_RELEASE_TABLET | Freq: Two times a day (BID) | ORAL | Status: DC

## 2014-09-22 NOTE — Patient Instructions (Signed)
Take the antibiotic twice daily for 10 days. Apply the steroid cream to the rash. Follow up with dermatology if the area is not improving over the next several days.

## 2014-09-22 NOTE — Progress Notes (Signed)
[name] August 27, 1975 [id number]        39 y.o.  G0P0 for annual exam.  Several issues noted below  Past medical history,surgical history, problem list, medications, allergies, family history and social history were all reviewed and documented as reviewed in the EPIC chart.  ROS:  Performed with pertinent positives and negatives included in the history, assessment and plan.   Additional significant findings :  Tender rash upper left arm   Exam: Kim assistant Filed Vitals:   09/22/14 1155  BP: 120/76  Height: 5\' 8"  (1.727 m)  Weight: 220 lb (99.791 kg)   General appearance:  Normal affect, orientation and appearance. Skin: Grossly normal excepting erythematous, warm rash inner upper left arm.  Mild vesicle appearing areas in the central portion.  No drainage or weeping. No axillary adenopathy. HEENT: Without gross lesions.  No cervical or supraclavicular adenopathy. Thyroid normal.  Lungs:  Clear without wheezing, rales or rhonchi Cardiac: RR, without RMG Abdominal:  Soft, nontender, without masses, guarding, rebound, organomegaly or hernia Breasts:  Examined lying and sitting without masses, retractions, discharge or axillary adenopathy.  With well-healed reduction scars Pelvic:  Ext/BUS/vagina normal  Cervix normal. Pap/HPV  Uterus anteverted, normal size, shape and contour, midline and mobile nontender   Adnexa  Without masses or tenderness    Anus and perineum  Normal   Rectovaginal  Normal sphincter tone without palpated masses or tenderness.    Assessment/Plan:  39 y.o. G0P0 female for annual exam without menses on continuous oral contraceptives.   1. Continuous contraceptives. Doing well with no bleeding at all. Takes them continuously because of menstrual migraines. Wants to continue. Refill 1 year provided with Ortho-Novum 1/35 equivalents. No significant medical history or smoking history. Risks of thrombosis have been discussed on multiple  occasions. 2. Erythematous rash upper inner arm. Had been at the beach but denies any known insect bites or trauma. We'll cover for cellulitis with doxycycline 100 mg twice a day 10 days. Also with Diprolene 0.05% cream. If does not improve over the next several days or certainly appears to be worsening then she knows to call for dermatology evaluation. 3. Mammography 2010. Plan mammography next year at age 31 per her choice. SBE monthly reviewed. 4. Pap smear 2013. Pap/HPV today. Does have history of several ASCUS Pap smears with negative HPV previously. Most recent Pap smears have all been normal. 5. Health maintenance. No routine blood work done as she reports is done through her primary physician's office. Follow up 1 year, sooner as needed.   Anastasio Auerbach MD, 12:17 PM 09/22/2014

## 2014-09-22 NOTE — Telephone Encounter (Signed)
New Rx sent for Doxycycline 100 mg bid x 10 days.  Pharmacy notified.

## 2014-09-22 NOTE — Telephone Encounter (Signed)
Pharmacist called inquiring about Rx you sent earlier. She said you sent Rx for Doryx but she wondered if it had to be brand or could they use Doxycycline Hyclate 100 mg?

## 2014-09-22 NOTE — Addendum Note (Signed)
Addended by: Nelva Nay on: 09/22/2014 12:36 PM   Modules accepted: Orders

## 2014-09-22 NOTE — Telephone Encounter (Signed)
Generic/cheapest okay.

## 2014-09-24 LAB — CYTOLOGY - PAP

## 2014-11-13 ENCOUNTER — Ambulatory Visit (INDEPENDENT_AMBULATORY_CARE_PROVIDER_SITE_OTHER): Payer: 59 | Admitting: Psychiatry

## 2014-11-13 ENCOUNTER — Encounter (HOSPITAL_COMMUNITY): Payer: Self-pay | Admitting: Psychiatry

## 2014-11-13 VITALS — BP 125/89 | HR 102 | Ht 68.0 in | Wt 221.8 lb

## 2014-11-13 DIAGNOSIS — F330 Major depressive disorder, recurrent, mild: Secondary | ICD-10-CM

## 2014-11-13 MED ORDER — DULOXETINE HCL 30 MG PO CPEP: ORAL_CAPSULE | ORAL | Status: DC

## 2014-11-13 MED ORDER — TRAZODONE HCL 50 MG PO TABS: 50.0000 mg | ORAL_TABLET | Freq: Every day | ORAL | Status: DC

## 2014-11-13 NOTE — Progress Notes (Signed)
[place] Progress Note  ARTINA MINELLA [id number] 39 y.o.  11/13/2014 2:19 PM  Chief Complaint:  Medication management and followup.  History of Present Illness:  [name] came for her followup appointment.  She is taking her medication as prescribed.  Overall she described her mood stable but there were times when she was feeling nervous and anxious.  She did handle the anxiety very well.  She's not sure what triggered the anxiety symptoms.  She recently seen her OB/GYN and now she is taking oral contraceptives every day.  Her headaches are less intense and less frequent.  She mentioned her husband completed 3000 miles race but it was experience.  There were only getting 1 hour sleep and very tired.  Patient reported her energy level is good.  She denies any irritability, anger, crying spells or any suicidal thoughts or homicidal thought.  She denies any feeling of hopelessness or worthlessness.  She denies any paranoia or any hallucination.  She lives with her husband who is very supportive.  Patient goes to primary care physician at [facility].  Her appetite is okay.  Her vitals are stable.  She is seeing Melinda Crutch every few months for counseling.  Suicidal Ideation: No Plan Formed: No Patient has means to carry out plan: No  Homicidal Ideation: No Plan Formed: No Patient has means to carry out plan: No  Medical History; Patient has history of migraine .    Past Psychiatric History/Hospitalization(s) Anxiety: Yes Bipolar Disorder: No Depression: Yes Mania: No Psychosis: No Schizophrenia: No Personality Disorder: No Hospitalization for psychiatric illness: No History of Electroconvulsive Shock Therapy: No Prior Suicide Attempts: No   Review of Systems: Psychiatric: Agitation: No Hallucination: No Depressed Mood: No Insomnia: No Hypersomnia: No Altered Concentration: No Feels Worthless: No Grandiose Ideas: No Belief In Special  Powers: No New/Increased Substance Abuse: No Compulsions: No  Neurologic: Headache: No Seizure: No Paresthesias: No    Outpatient Encounter Prescriptions as of 11/13/2014  Medication Sig  . augmented betamethasone dipropionate (DIPROLENE AF) 0.05 % cream Apply topically 2 (two) times daily.  . DULoxetine (CYMBALTA) 30 MG capsule Take 3 daily  . Magnesium 250 MG TABS Take 250 mg by mouth 3 (three) times daily.    . norethindrone-ethinyl estradiol 1/35 (ORTHO-NOVUM, NORTREL,CYCLAFEM) tablet Patient takes active pills only skipping placebo pills  . Omega-3 Fatty Acids (OMEGA 3 PO) Take by mouth.    . SUMAtriptan (IMITREX) 100 MG tablet Take 100 mg by mouth as needed.    . traZODone (DESYREL) 50 MG tablet Take 1 tablet (50 mg total) by mouth at bedtime.  Marland Kitchen zonisamide (ZONEGRAN) 100 MG capsule Take 250 mg by mouth daily.  . [DISCONTINUED] doxycycline (VIBRAMYCIN) 100 MG capsule Take 1 capsule (100 mg total) by mouth 2 (two) times daily.  . [DISCONTINUED] DULoxetine (CYMBALTA) 30 MG capsule Take 3 daily  . [DISCONTINUED] traZODone (DESYREL) 50 MG tablet Take 1 tablet (50 mg total) by mouth at bedtime.   No facility-administered encounter medications on file as of 11/13/2014.    Recent Results (from the past 2160 hour(s))  Cytology - PAP     Status: None   Collection Time: 09/22/14 12:00 AM  Result Value Ref Range   CYTOLOGY - PAP PAP RESULT       Physical Exam: Constitutional:  BP 125/89 mmHg  Pulse 102  Ht 5\' 8"  (1.727 m)  Wt 221 lb 12.8 oz (100.608 kg)  BMI 33.73 kg/m2  Musculoskeletal: Strength & Muscle  Tone: within normal limits Gait & Station: normal Patient leans: N/A  Mental Status Examination;  Patient is casually dressed and groomed.  She maintains good eye contact.  She is pleasant and cooperative.  Her speech is soft clear and coherent.  Her thought processes logical and goal-directed.  Her attention and concentration is good.  She describes her mood euthymic  and her affect is appropriate.  She denies any auditory or visual hallucination.  She denies any active or passive suicidal thoughts or homicidal thoughts.  There were no paranoia or delusions obsession present at this time.  There were no flight of ideas or any loose associations present.  Her fund of knowledge is adequate.  She is alert and oriented x3.  There were no tremors or shakes present.  Her insight judgment and impulse control is okay.    Established Problem, Stable/Improving (1), Review of Last Therapy Session (1) and Review of Medication Regimen & Side Effects (2)  Assessment: Axis I: Maj. depressive disorder, recurrent  Axis II: Deferred  Axis III:  Past Medical History  Diagnosis Date  . Migraines   . Kidney stones   . Depression   . ASCUS (atypical squamous cells of undetermined significance) on Pap smear    Plan:  Patient is a stable on her current medication.  She has no concerns or side effects.  I will continue Cymbalta 90 mg daily and trazodone 50 mg at bedtime.  Discussed medication side effects and benefits.  Recommended to call us if she has any question, concern or if she feels worsening of the symptom.  She is seeing Melinda Crutch for counseling.  I will see her again in 3 months.   ARFEEN,SYED T., MD 11/13/2014

## 2015-01-27 MED FILL — traZODone HCL 50 MG TABS: 50 | 90 days supply | Qty: 90 | Fill #0

## 2015-01-27 MED FILL — DULoxetine HCL 30 MG CPEP: 30 | 30 days supply | Qty: 90 | Fill #1

## 2015-02-16 ENCOUNTER — Ambulatory Visit (INDEPENDENT_AMBULATORY_CARE_PROVIDER_SITE_OTHER): Payer: 59 | Admitting: Psychiatry

## 2015-02-16 ENCOUNTER — Encounter (HOSPITAL_COMMUNITY): Payer: Self-pay | Admitting: Psychiatry

## 2015-02-16 VITALS — BP 118/82 | HR 103 | Ht 68.0 in | Wt 222.2 lb

## 2015-02-16 DIAGNOSIS — F330 Major depressive disorder, recurrent, mild: Secondary | ICD-10-CM

## 2015-02-16 MED ORDER — TRAZODONE HCL 50 MG PO TABS: 50.0000 mg | ORAL_TABLET | Freq: Every day | ORAL | Status: DC

## 2015-02-16 MED ORDER — DULOXETINE HCL 30 MG PO CPEP: ORAL_CAPSULE | ORAL | Status: DC

## 2015-02-16 MED FILL — ZONISAMIDE 100 MG CAPSULE: 100 | 90 days supply | Qty: 180 | Fill #1

## 2015-02-16 NOTE — Progress Notes (Signed)
[name] Progress Note  SAJE SWEARINGEN [id number] 40 y.o.  02/16/2015 2:44 PM  Chief Complaint:  Medication management and followup.  History of Present Illness:  [name] came for her followup appointment.  She had a good Christmas.  She visited [place] to see therapist and family members.  She is taking her medication as prescribed.  She denies any major anxiety or panic attack.  Her depression is under control.  She started training for upcoming marathon .  Her energy level is good.  She denies any crying spells, irritability, feeling of hopelessness or worthlessness.  She lives with her husband is very supportive.  Recently she's seen her primary care physician and there has been no changes.  Patient denies drinking or using any illegal substances.  Her appetite is okay.  Her vitals are stable. She is seeing Melinda Crutch every few months for counseling.  Suicidal Ideation: No Plan Formed: No Patient has means to carry out plan: No  Homicidal Ideation: No Plan Formed: No Patient has means to carry out plan: No  Medical History; Patient has history of migraine .    Past Psychiatric History/Hospitalization(s) Anxiety: Yes Bipolar Disorder: No Depression: Yes Mania: No Psychosis: No Schizophrenia: No Personality Disorder: No Hospitalization for psychiatric illness: No History of Electroconvulsive Shock Therapy: No Prior Suicide Attempts: No   Review of Systems: Psychiatric: Agitation: No Hallucination: No Depressed Mood: No Insomnia: No Hypersomnia: No Altered Concentration: No Feels Worthless: No Grandiose Ideas: No Belief In Special Powers: No New/Increased Substance Abuse: No Compulsions: No  Neurologic: Headache: No Seizure: No Paresthesias: No    Outpatient Encounter Prescriptions as of 02/16/2015  Medication Sig  . augmented betamethasone dipropionate (DIPROLENE AF) 0.05 % cream Apply topically 2 (two) times daily.  .  DULoxetine (CYMBALTA) 30 MG capsule Take 3 daily  . Magnesium 250 MG TABS Take 250 mg by mouth 3 (three) times daily.    . norethindrone-ethinyl estradiol 1/35 (ORTHO-NOVUM, NORTREL,CYCLAFEM) tablet Patient takes active pills only skipping placebo pills  . Omega-3 Fatty Acids (OMEGA 3 PO) Take by mouth.    . SUMAtriptan (IMITREX) 100 MG tablet Take 100 mg by mouth as needed.    . traZODone (DESYREL) 50 MG tablet Take 1 tablet (50 mg total) by mouth at bedtime.  Marland Kitchen zonisamide (ZONEGRAN) 100 MG capsule Take 250 mg by mouth daily.  . [DISCONTINUED] DULoxetine (CYMBALTA) 30 MG capsule Take 3 daily  . [DISCONTINUED] traZODone (DESYREL) 50 MG tablet Take 1 tablet (50 mg total) by mouth at bedtime.   No facility-administered encounter medications on file as of 02/16/2015.    No results found for this or any previous visit (from the past 2160 hour(s)).    Physical Exam: Constitutional:  BP 118/82 mmHg  Pulse 103  Ht 5\' 8"  (1.727 m)  Wt 222 lb 3.2 oz (100.789 kg)  BMI 33.79 kg/m2  Musculoskeletal: Strength & Muscle Tone: within normal limits Gait & Station: normal Patient leans: N/A  Mental Status Examination;  Patient is casually dressed and groomed.  She maintains good eye contact.  She is pleasant and cooperative.  Her speech is soft clear and coherent.  Her thought processes logical and goal-directed.  Her attention and concentration is good.  She describes her mood euthymic and her affect is appropriate.  She denies any auditory or visual hallucination.  She denies any active or passive suicidal thoughts or homicidal thoughts.  There were no paranoia or delusions obsession present at this time.  There were no flight of ideas or any loose associations present.  Her fund of knowledge is adequate.  She is alert and oriented x3.  There were no tremors or shakes present.  Her insight judgment and impulse control is okay.    Established Problem, Stable/Improving (1), Review of Last Therapy  Session (1) and Review of Medication Regimen & Side Effects (2)  Assessment: Axis I: Maj. depressive disorder, recurrent  Axis II: Deferred  Axis III:  Past Medical History  Diagnosis Date  . Migraines   . Kidney stones   . Depression   . ASCUS (atypical squamous cells of undetermined significance) on Pap smear    Plan:  Patient is a stable on her current medication.  She has no side effects.  I'll continue Cymbalta 90 mg daily and trazodone 50 mg at bedtime.  Discussed medication side effects and benefits.  Recommended to call us if she has any question, concern or if she feels worsening of the symptom.  She is seeing Melinda Crutch for counseling.  I will see her again in 3 months.   [name] T., MD 02/16/2015

## 2015-02-19 MED FILL — predniSONE 20 MG TABS: 20 | 4 days supply | Qty: 10 | Fill #0

## 2015-02-27 MED FILL — DASETTA 1-35-28 TABLET: 1-35 | 84 days supply | Qty: 112 | Fill #1

## 2015-02-27 MED FILL — DULoxetine HCL 30 MG CPEP: 30 | 30 days supply | Qty: 90 | Fill #2

## 2015-03-04 DIAGNOSIS — M542 Cervicalgia: Secondary | ICD-10-CM | POA: Diagnosis not present

## 2015-03-04 MED FILL — SUMATRIPTAN SUCC 100 MG TAB: 100 | 90 days supply | Qty: 27 | Fill #0

## 2015-03-09 DIAGNOSIS — G43839 Menstrual migraine, intractable, without status migrainosus: Secondary | ICD-10-CM | POA: Diagnosis not present

## 2015-03-09 DIAGNOSIS — G43019 Migraine without aura, intractable, without status migrainosus: Secondary | ICD-10-CM | POA: Diagnosis not present

## 2015-03-09 DIAGNOSIS — G43109 Migraine with aura, not intractable, without status migrainosus: Secondary | ICD-10-CM | POA: Diagnosis not present

## 2015-03-09 MED FILL — ZOMIG 5 MG NASAL SPRAY: 5 | 30 days supply | Qty: 6 | Fill #0

## 2015-03-11 DIAGNOSIS — M542 Cervicalgia: Secondary | ICD-10-CM | POA: Diagnosis not present

## 2015-03-12 DIAGNOSIS — M542 Cervicalgia: Secondary | ICD-10-CM | POA: Diagnosis not present

## 2015-03-23 DIAGNOSIS — M542 Cervicalgia: Secondary | ICD-10-CM | POA: Diagnosis not present

## 2015-03-24 ENCOUNTER — Other Ambulatory Visit: Payer: Self-pay

## 2015-03-24 DIAGNOSIS — M542 Cervicalgia: Secondary | ICD-10-CM | POA: Diagnosis not present

## 2015-03-24 DIAGNOSIS — Z1231 Encounter for screening mammogram for malignant neoplasm of breast: Secondary | ICD-10-CM

## 2015-03-31 MED FILL — DULoxetine HCL 30 MG CPEP: 30 | 30 days supply | Qty: 90 | Fill #0

## 2015-04-06 DIAGNOSIS — M542 Cervicalgia: Secondary | ICD-10-CM | POA: Diagnosis not present

## 2015-04-06 DIAGNOSIS — Z6834 Body mass index (BMI) 34.0-34.9, adult: Secondary | ICD-10-CM | POA: Diagnosis not present

## 2015-04-06 DIAGNOSIS — J209 Acute bronchitis, unspecified: Secondary | ICD-10-CM | POA: Diagnosis not present

## 2015-04-06 MED FILL — predniSONE 10 MG TABS: 10 | 4 days supply | Qty: 8 | Fill #0

## 2015-04-06 MED FILL — HYDROCODONE-HOMATROPINE SYR: 5-1.5 | 12 days supply | Qty: 180 | Fill #0

## 2015-04-06 MED FILL — levoFLOXacin 500 MG TABS: 500 | 7 days supply | Qty: 7 | Fill #0

## 2015-04-14 ENCOUNTER — Ambulatory Visit
Admission: RE | Admit: 2015-04-14 | Discharge: 2015-04-14 | Disposition: A | Discharge status: Home / Self Care | Payer: 59 | Source: Ambulatory Visit

## 2015-04-14 DIAGNOSIS — Z1231 Encounter for screening mammogram for malignant neoplasm of breast: Secondary | ICD-10-CM | POA: Diagnosis not present

## 2015-04-27 MED FILL — traZODone HCL 50 MG TABS: 50 | 90 days supply | Qty: 90 | Fill #0

## 2015-04-27 MED FILL — DULoxetine HCL 30 MG CPEP: 30 | 30 days supply | Qty: 90 | Fill #1

## 2015-04-27 MED FILL — ZONISAMIDE 100 MG CAPSULE: 100 | 90 days supply | Qty: 270 | Fill #0

## 2015-05-04 DIAGNOSIS — M542 Cervicalgia: Secondary | ICD-10-CM | POA: Diagnosis not present

## 2015-05-18 ENCOUNTER — Ambulatory Visit (HOSPITAL_COMMUNITY): Payer: Self-pay | Admitting: Psychiatry

## 2015-05-18 MED FILL — ZOMIG 5 MG NASAL SPRAY: 5 | 30 days supply | Qty: 6 | Fill #1

## 2015-05-20 MED FILL — SUMATRIPTAN SUCC 100 MG TAB: 100 | 90 days supply | Qty: 27 | Fill #0

## 2015-05-20 MED FILL — DASETTA 1-35-28 TABLET: 1-35 | 84 days supply | Qty: 112 | Fill #2

## 2015-05-21 ENCOUNTER — Ambulatory Visit (HOSPITAL_COMMUNITY): Payer: Self-pay | Admitting: Psychiatry

## 2015-05-27 ENCOUNTER — Encounter (HOSPITAL_COMMUNITY): Payer: Self-pay | Admitting: Psychiatry

## 2015-05-27 ENCOUNTER — Ambulatory Visit (INDEPENDENT_AMBULATORY_CARE_PROVIDER_SITE_OTHER): Payer: 59 | Admitting: Psychiatry

## 2015-05-27 VITALS — BP 105/75 | HR 86 | Wt 218.2 lb

## 2015-05-27 DIAGNOSIS — F330 Major depressive disorder, recurrent, mild: Secondary | ICD-10-CM

## 2015-05-27 MED ORDER — TRAZODONE HCL 50 MG PO TABS: 50.0000 mg | ORAL_TABLET | Freq: Every day | ORAL | Status: DC

## 2015-05-27 MED ORDER — DULOXETINE HCL 30 MG PO CPEP: ORAL_CAPSULE | ORAL | Status: DC

## 2015-05-27 MED FILL — DULoxetine HCL 30 MG CPEP: 30 | 30 days supply | Qty: 90 | Fill #0

## 2015-05-27 NOTE — Progress Notes (Signed)
[place] Progress Note  LARYSA MILANI [id number] 40 y.o.  05/27/2015 3:52 PM  Chief Complaint:  Medication management and followup.  History of Present Illness:  [name] came for her followup appointment.  She is taking Cymbalta and trazodone.  She is a stable on her current psychiatric medication.  She denies any major panic attack, irritability, crying spells or any depressive thoughts.  She is practicing to run marathon in October.  She denies any feeling of hopelessness or worthlessness.  She has no tremors, shakes or any EPS.  Her sleep is good.  She denies any concern about medication.  Her vitals are stable.  Her appetite is okay. She is seeing Melinda Crutch every few months for counseling.  Suicidal Ideation: No Plan Formed: No Patient has means to carry out plan: No  Homicidal Ideation: No Plan Formed: No Patient has means to carry out plan: No  Medical History; Patient has history of migraine .    Past Psychiatric History/Hospitalization(s) Anxiety: Yes Bipolar Disorder: No Depression: Yes Mania: No Psychosis: No Schizophrenia: No Personality Disorder: No Hospitalization for psychiatric illness: No History of Electroconvulsive Shock Therapy: No Prior Suicide Attempts: No   Review of Systems: Psychiatric: Agitation: No Hallucination: No Depressed Mood: No Insomnia: No Hypersomnia: No Altered Concentration: No Feels Worthless: No Grandiose Ideas: No Belief In Special Powers: No New/Increased Substance Abuse: No Compulsions: No  Neurologic: Headache: No Seizure: No Paresthesias: No    Outpatient Encounter Prescriptions as of 05/27/2015  Medication Sig  . augmented betamethasone dipropionate (DIPROLENE AF) 0.05 % cream Apply topically 2 (two) times daily.  . DULoxetine (CYMBALTA) 30 MG capsule Take 3 daily  . Magnesium 250 MG TABS Take 250 mg by mouth 3 (three) times daily.    . norethindrone-ethinyl estradiol 1/35 (ORTHO-NOVUM,  NORTREL,CYCLAFEM) tablet Patient takes active pills only skipping placebo pills  . Omega-3 Fatty Acids (OMEGA 3 PO) Take by mouth.    . SUMAtriptan (IMITREX) 100 MG tablet Take 100 mg by mouth as needed.    . traZODone (DESYREL) 50 MG tablet Take 1 tablet (50 mg total) by mouth at bedtime.  Marland Kitchen ZOMIG 5 MG nasal solution   . zonisamide (ZONEGRAN) 100 MG capsule Take 300 mg by mouth daily.   . [DISCONTINUED] DULoxetine (CYMBALTA) 30 MG capsule Take 3 daily  . [DISCONTINUED] traZODone (DESYREL) 50 MG tablet Take 1 tablet (50 mg total) by mouth at bedtime.   No facility-administered encounter medications on file as of 05/27/2015.    No results found for this or any previous visit (from the past 2160 hour(s)).    Physical Exam: Constitutional:  BP 105/75 mmHg  Pulse 86  Wt 218 lb 3.2 oz (98.975 kg)  Musculoskeletal: Strength & Muscle Tone: within normal limits Gait & Station: normal Patient leans: N/A  Mental Status Examination;  Patient is casually dressed and groomed.  She maintains good eye contact.  She is pleasant and cooperative.  Her speech is soft clear and coherent.  Her thought processes logical and goal-directed.  Her attention and concentration is good.  She describes her mood euthymic and her affect is appropriate.  She denies any auditory or visual hallucination.  She denies any active or passive suicidal thoughts or homicidal thoughts.  There were no paranoia or delusions obsession present at this time.  There were no flight of ideas or any loose associations present.  Her fund of knowledge is adequate.  She is alert and oriented x3.  There were no  tremors or shakes present.  Her insight judgment and impulse control is okay.    Established Problem, Stable/Improving (1), Review of Last Therapy Session (1) and Review of Medication Regimen & Side Effects (2)  Assessment: Axis I: Maj. depressive disorder, recurrent  Axis II: Deferred  Axis III:  Past Medical History   Diagnosis Date  . Migraines   . Kidney stones   . Depression   . ASCUS (atypical squamous cells of undetermined significance) on Pap smear    Plan:  Patient is a stable on her current medication.  She like to continue her current psychiatric medication.  I will continue Cymbalta 90 mg daily and trazodone 50 mg at bedtime.  Discussed medication side effects and benefits.  Recommended to call us if she has any question, concern or if she feels worsening of the symptom.  She is seeing Melinda Crutch for counseling.  I will see her again in 3 months.   Dreanna Kyllo T., MD 05/27/2015

## 2015-06-25 DIAGNOSIS — M542 Cervicalgia: Secondary | ICD-10-CM | POA: Diagnosis not present

## 2015-06-30 MED FILL — DULoxetine HCL 30 MG CPEP: 30 | 30 days supply | Qty: 90 | Fill #1

## 2015-07-24 MED FILL — ZONISAMIDE 100 MG CAPSULE: 100 | 90 days supply | Qty: 270 | Fill #1

## 2015-07-24 MED FILL — traZODone HCL 50 MG TABS: 50 | 90 days supply | Qty: 90 | Fill #0

## 2015-07-30 MED FILL — DULoxetine HCL 30 MG CPEP: 30 | 30 days supply | Qty: 90 | Fill #2

## 2015-08-13 MED FILL — DASETTA 1-35-28 TABLET: 1-35 | 84 days supply | Qty: 112 | Fill #3

## 2015-08-19 MED FILL — SUMATRIPTAN SUCC 100 MG TAB: 100 | 30 days supply | Qty: 9 | Fill #0

## 2015-08-27 ENCOUNTER — Ambulatory Visit (HOSPITAL_COMMUNITY): Payer: Self-pay | Admitting: Psychiatry

## 2015-08-31 MED FILL — DULoxetine HCL 30 MG CPEP: 30 | 30 days supply | Qty: 90 | Fill #2

## 2015-09-02 DIAGNOSIS — G43019 Migraine without aura, intractable, without status migrainosus: Secondary | ICD-10-CM | POA: Diagnosis not present

## 2015-09-02 DIAGNOSIS — G43839 Menstrual migraine, intractable, without status migrainosus: Secondary | ICD-10-CM | POA: Diagnosis not present

## 2015-09-02 DIAGNOSIS — G43109 Migraine with aura, not intractable, without status migrainosus: Secondary | ICD-10-CM | POA: Diagnosis not present

## 2015-09-23 ENCOUNTER — Encounter: Payer: Self-pay | Admitting: Gynecology

## 2015-09-23 ENCOUNTER — Ambulatory Visit (INDEPENDENT_AMBULATORY_CARE_PROVIDER_SITE_OTHER): Payer: 59 | Admitting: Gynecology

## 2015-09-23 VITALS — BP 118/74 | Ht 68.5 in | Wt 223.0 lb

## 2015-09-23 DIAGNOSIS — Z01419 Encounter for gynecological examination (general) (routine) without abnormal findings: Secondary | ICD-10-CM

## 2015-09-23 DIAGNOSIS — Z113 Encounter for screening for infections with a predominantly sexual mode of transmission: Secondary | ICD-10-CM

## 2015-09-23 MED ORDER — NORETHINDRONE-ETH ESTRADIOL 1-35 MG-MCG PO TABS: ORAL_TABLET | ORAL | 3 refills | Status: DC

## 2015-09-23 NOTE — Progress Notes (Signed)
    [name] 07-04-1975 [id number]        40 y.o.  G0P0  for annual exam.  Doing well. Several issues discussed below.  Past medical history,surgical history, problem list, medications, allergies, family history and social history were all reviewed and documented as reviewed in the EPIC chart.  ROS:  Performed with pertinent positives and negatives included in the history, assessment and plan.   Additional significant findings :  None   Exam: Caryn Bee assistant Vitals:   09/23/15 1400  BP: 118/74  Weight: 223 lb (101.2 kg)  Height: 5' 8.5" (1.74 m)   Body mass index is 33.41 kg/m.  General appearance:  Normal affect, orientation and appearance. Skin: Grossly normal HEENT: Without gross lesions.  No cervical or supraclavicular adenopathy. Thyroid normal.  Lungs:  Clear without wheezing, rales or rhonchi Cardiac: RR, without RMG Abdominal:  Soft, nontender, without masses, guarding, rebound, organomegaly or hernia Breasts:  Examined lying and sitting without masses, retractions, discharge or axillary adenopathy.  With well-healed bilateral reduction scars Pelvic:  Ext/BUS/Vagina normal  Cervix normal  Uterus anteverted, normal size, shape and contour, midline and mobile nontender   Adnexa without masses or tenderness    Anus and perineum normal   Rectovaginal normal sphincter tone without palpated masses or tenderness.    Assessment/Plan:  40 y.o. G0P0 female for annual exam without menses, continuous oral contraceptives.   1. Continuous oral contraceptives. Patient continues on Ortho-Novum 1/35 continuous oral contraceptives.  Uses both for contraception and migraine suppression. I again reviewed the risks to include thrombosis such as stroke heart attack DVT and possible increased risks in patients with migraines. Patient understands and accepts and refill 1 year provided.  2. HSV screening. Patient's husband recently had HSV outbreak. Patient requests serum  screening. She's never had an outbreak. Declines other STD screening. HSV 1 and 2 IgG/IgM ordered. 3. Mammography 03/2015. Continue with annual mammography when due. SBE monthly reviewed. 4. Pap smear/HPV 2016 negative. No Pap smear done today. Does have several ASCUS Pap smears in the past with negative high-risk HPV. 5. Health maintenance. No routine blood work done as patient does this elsewhere. Follow up 1 year, sooner as needed.   Anastasio Auerbach MD, 2:29 PM 09/23/2015

## 2015-09-23 NOTE — Patient Instructions (Signed)

## 2015-09-24 ENCOUNTER — Telehealth: Payer: Self-pay | Admitting: Gynecology

## 2015-09-24 LAB — HSV(HERPES SMPLX)ABS-I+II(IGG+IGM)-BLD
HSV 1 Glycoprotein G Ab, IgG: 2.67 Index — ABNORMAL HIGH (ref <0.90)
HSV 2 Glycoprotein G Ab, IgG: <0.9 Index (ref <0.90)
Herpes Simplex Vrs I&II-IgM Ab (EIA): 1.36 INDEX — ABNORMAL HIGH

## 2015-09-24 MED FILL — SUMATRIPTAN SUCC 100 MG TAB: 100 | 30 days supply | Qty: 9 | Fill #0

## 2015-09-24 NOTE — Telephone Encounter (Signed)
Call patient with HSV lab results. 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 HSV 2 was negative. 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 I reviewed various scenarios with her to include old HSV-1 with new exposure to HSV 2. Possible crossover zone for both IgG and IgM with a new exposure to HSV 1 where both of the antibodies are positive as the exposure history is 2-3 months ago. I also reviewed the potential for false positives and cross-reactivity with other herpes type virus is for the IgM. Recommendation is to repeat the IgG and IgM HSV-1 and 2 tests in 6 months. Husband is currently using Valtrex suppression and is going to continue to do so. I reviewed with the patient that this does not guarantee transmission would not occur but does decrease the likelihood. Patient will call me in 6 months to arrange for the follow up lab tests.

## 2015-09-30 ENCOUNTER — Other Ambulatory Visit (HOSPITAL_COMMUNITY): Payer: Self-pay | Admitting: Psychiatry

## 2015-09-30 DIAGNOSIS — F330 Major depressive disorder, recurrent, mild: Secondary | ICD-10-CM

## 2015-10-02 ENCOUNTER — Other Ambulatory Visit (HOSPITAL_COMMUNITY): Payer: Self-pay

## 2015-10-02 DIAGNOSIS — F330 Major depressive disorder, recurrent, mild: Secondary | ICD-10-CM

## 2015-10-02 MED ORDER — TRAZODONE HCL 50 MG PO TABS: 50.0000 mg | ORAL_TABLET | Freq: Every day | ORAL | 0 refills | Status: DC

## 2015-10-02 MED ORDER — DULOXETINE HCL 30 MG PO CPEP: 90.0000 mg | ORAL_CAPSULE | Freq: Every day | ORAL | 0 refills | Status: DC

## 2015-10-02 MED FILL — DULoxetine HCL 30 MG CPEP: 30 | 30 days supply | Qty: 90 | Fill #0

## 2015-10-02 NOTE — Progress Notes (Signed)
Patient called for refills on her medications, refill is appropriate and patient has a follow up this month. Per protocol I sent a one month order to the pharmacy.

## 2015-10-05 MED FILL — predniSONE 20 MG TABS: 20 | 4 days supply | Qty: 10 | Fill #0

## 2015-10-08 MED FILL — ZOMIG 5 MG NASAL SPRAY: 5 | 30 days supply | Qty: 6 | Fill #2

## 2015-10-09 DIAGNOSIS — G43909 Migraine, unspecified, not intractable, without status migrainosus: Secondary | ICD-10-CM | POA: Diagnosis not present

## 2015-10-09 DIAGNOSIS — M2550 Pain in unspecified joint: Secondary | ICD-10-CM | POA: Diagnosis not present

## 2015-10-09 DIAGNOSIS — Z23 Encounter for immunization: Secondary | ICD-10-CM | POA: Diagnosis not present

## 2015-10-09 DIAGNOSIS — I00 Rheumatic fever without heart involvement: Secondary | ICD-10-CM | POA: Diagnosis not present

## 2015-10-09 MED FILL — DOXYCYCLINE HYCLATE 100 MG: 100 | 10 days supply | Qty: 20 | Fill #0

## 2015-10-15 ENCOUNTER — Ambulatory Visit (HOSPITAL_COMMUNITY): Payer: Self-pay | Admitting: Psychiatry

## 2015-10-20 MED FILL — ZONISAMIDE 100 MG CAPSULE: 100 | 90 days supply | Qty: 270 | Fill #0

## 2015-10-20 MED FILL — traZODone HCL 50 MG TABS: 50 | 30 days supply | Qty: 30 | Fill #0

## 2015-10-26 DIAGNOSIS — M542 Cervicalgia: Secondary | ICD-10-CM | POA: Diagnosis not present

## 2015-10-30 ENCOUNTER — Other Ambulatory Visit (HOSPITAL_COMMUNITY): Payer: Self-pay

## 2015-10-30 DIAGNOSIS — F330 Major depressive disorder, recurrent, mild: Secondary | ICD-10-CM

## 2015-10-30 MED ORDER — DULOXETINE HCL 30 MG PO CPEP: 90.0000 mg | ORAL_CAPSULE | Freq: Every day | ORAL | 0 refills | Status: DC

## 2015-10-30 MED FILL — DULoxetine HCL 30 MG CPEP: 30 | 30 days supply | Qty: 90 | Fill #0

## 2015-11-02 DIAGNOSIS — M542 Cervicalgia: Secondary | ICD-10-CM | POA: Diagnosis not present

## 2015-11-02 MED FILL — SUMATRIPTAN SUCC 100 MG TAB: 100 | 30 days supply | Qty: 9 | Fill #0

## 2015-11-06 MED FILL — DASETTA 1-35-28 TABLET: 1-35 | 84 days supply | Qty: 112 | Fill #0

## 2015-11-13 MED FILL — ZOMIG 5 MG NASAL SPRAY: 5 | 30 days supply | Qty: 6 | Fill #3

## 2015-11-24 DIAGNOSIS — H5213 Myopia, bilateral: Secondary | ICD-10-CM | POA: Diagnosis not present

## 2015-11-26 ENCOUNTER — Other Ambulatory Visit (HOSPITAL_COMMUNITY): Payer: Self-pay | Admitting: Psychiatry

## 2015-11-26 DIAGNOSIS — M7701 Medial epicondylitis, right elbow: Secondary | ICD-10-CM | POA: Diagnosis not present

## 2015-11-26 DIAGNOSIS — M542 Cervicalgia: Secondary | ICD-10-CM | POA: Diagnosis not present

## 2015-11-26 DIAGNOSIS — F330 Major depressive disorder, recurrent, mild: Secondary | ICD-10-CM

## 2015-11-26 MED ORDER — TRAZODONE HCL 50 MG PO TABS: 50.0000 mg | ORAL_TABLET | Freq: Every day | ORAL | 0 refills | Status: DC

## 2015-11-26 MED FILL — traZODone HCL 50 MG TABS: 50 | 30 days supply | Qty: 30 | Fill #0

## 2015-11-27 DIAGNOSIS — M542 Cervicalgia: Secondary | ICD-10-CM | POA: Diagnosis not present

## 2015-11-27 DIAGNOSIS — M7701 Medial epicondylitis, right elbow: Secondary | ICD-10-CM | POA: Diagnosis not present

## 2015-11-30 ENCOUNTER — Other Ambulatory Visit (HOSPITAL_COMMUNITY): Payer: Self-pay | Admitting: Psychiatry

## 2015-11-30 DIAGNOSIS — F330 Major depressive disorder, recurrent, mild: Secondary | ICD-10-CM

## 2015-12-01 ENCOUNTER — Other Ambulatory Visit (HOSPITAL_COMMUNITY): Payer: Self-pay | Admitting: Psychiatry

## 2015-12-01 DIAGNOSIS — F330 Major depressive disorder, recurrent, mild: Secondary | ICD-10-CM

## 2015-12-01 MED ORDER — DULOXETINE HCL 30 MG PO CPEP: 90.0000 mg | ORAL_CAPSULE | Freq: Every day | ORAL | 0 refills | Status: DC

## 2015-12-01 MED FILL — DULoxetine HCL 30 MG CPEP: 30 | 90 days supply | Qty: 270 | Fill #0

## 2015-12-08 DIAGNOSIS — Z0000 Encounter for general adult medical examination without abnormal findings: Secondary | ICD-10-CM | POA: Diagnosis not present

## 2015-12-10 MED FILL — SUMATRIPTAN SUCC 100 MG TAB: 100 | 30 days supply | Qty: 9 | Fill #1

## 2015-12-15 ENCOUNTER — Encounter (HOSPITAL_COMMUNITY): Payer: Self-pay | Admitting: Psychiatry

## 2015-12-15 ENCOUNTER — Ambulatory Visit (INDEPENDENT_AMBULATORY_CARE_PROVIDER_SITE_OTHER): Payer: 59 | Admitting: Psychiatry

## 2015-12-15 DIAGNOSIS — Z79899 Other long term (current) drug therapy: Secondary | ICD-10-CM | POA: Diagnosis not present

## 2015-12-15 DIAGNOSIS — F330 Major depressive disorder, recurrent, mild: Secondary | ICD-10-CM

## 2015-12-15 MED ORDER — TRAZODONE HCL 50 MG PO TABS: 50.0000 mg | ORAL_TABLET | Freq: Every day | ORAL | 0 refills | Status: DC

## 2015-12-15 MED ORDER — DULOXETINE HCL 30 MG PO CPEP: 90.0000 mg | ORAL_CAPSULE | Freq: Every day | ORAL | 0 refills | Status: DC

## 2015-12-15 NOTE — Progress Notes (Signed)
[facility] Progress Note  ZAHYRA BLITZ [id number] 40 y.o.  12/15/2015 2:18 PM  Chief Complaint:  I'm doing better on medication.    History of Present Illness:  Deshona came for her followup appointment.  She is complying the Cymbalta and trazodone and reported no side effects.  She admitted these days she is very sad and depressed because she does not like holidays and recently she visited her grandfather who is 25 year old in nursing home and have a lot of health issues and dementia.  She feels very sad when she saw him .  Overall she described her mood is a stable.  She denies any irritability, anger, mania and psychosis.  Her energy level is good.  She is not seeing Melinda Crutch and like to see someone in this office.  Patient denies any feeling of hopelessness and worthlessness.  Currently she is not training for any marathon but like to start after holidays.  Her appetite is okay.  Her vital signs are stable.  Patient denies drinking alcohol or using any illegal substances.  She wants to continue her current psychiatric medication.  Patient scheduled to have her physical and yearly blood work tomorrow morning.    Suicidal Ideation: No Plan Formed: No Patient has means to carry out plan: No  Homicidal Ideation: No Plan Formed: No Patient has means to carry out plan: No  Medical History; Patient has history of migraine .    Past Psychiatric History/Hospitalization(s) Anxiety: Yes Bipolar Disorder: No Depression: Yes Mania: No Psychosis: No Schizophrenia: No Personality Disorder: No Hospitalization for psychiatric illness: No History of Electroconvulsive Shock Therapy: No Prior Suicide Attempts: No   Review of Systems: Psychiatric: Agitation: No Hallucination: No Depressed Mood: No Insomnia: No Hypersomnia: No Altered Concentration: No Feels Worthless: No Grandiose Ideas: No Belief In Special Powers: No New/Increased Substance Abuse:  No Compulsions: No  Neurologic: Headache: No Seizure: No Paresthesias: No    Outpatient Encounter Prescriptions as of 12/15/2015  Medication Sig Dispense Refill  . augmented betamethasone dipropionate (DIPROLENE AF) 0.05 % cream Apply topically 2 (two) times daily. (Patient not taking: Reported on 09/23/2015) 30 g 0  . DULoxetine (CYMBALTA) 30 MG capsule Take 3 capsules (90 mg total) by mouth daily. Take 3 daily 270 capsule 0  . Magnesium 250 MG TABS Take 250 mg by mouth 3 (three) times daily.      . norethindrone-ethinyl estradiol 1/35 (ORTHO-NOVUM, [place]) tablet Patient takes active pills only skipping placebo pills 4 Package 3  . Omega-3 Fatty Acids (OMEGA 3 PO) Take by mouth.      . SUMAtriptan (IMITREX) 100 MG tablet Take 100 mg by mouth as needed.      . traZODone (DESYREL) 50 MG tablet Take 1 tablet (50 mg total) by mouth at bedtime. 90 tablet 0  . ZOMIG 5 MG nasal solution   3  . zonisamide (ZONEGRAN) 100 MG capsule Take 300 mg by mouth daily.     . [DISCONTINUED] DULoxetine (CYMBALTA) 30 MG capsule Take 3 capsules (90 mg total) by mouth daily. Take 3 daily 270 capsule 0  . [DISCONTINUED] traZODone (DESYREL) 50 MG tablet Take 1 tablet (50 mg total) by mouth at bedtime. 30 tablet 0   No facility-administered encounter medications on file as of 12/15/2015.     Recent Results (from the past 2160 hour(s))  HSV(herpes smplx)abs-1+2(IgG+IgM)-bld     Status: Abnormal   Collection Time: 09/23/15  2:31 PM  Result Value Ref Range   HSV  1 Glycoprotein G Ab, IgG 2.67 (H) <0.90 Index    Comment:                      Index             Interpretation                    =====             ==============                    < 0.90               NEGATIVE                    0.90 - 1.09          EQUIVOCAL                    > 1.09               POSITIVE    HSV 2 Glycoprotein G Ab, IgG <0.90 <0.90 Index    Comment:                      Index             Interpretation                     =====             ==============                    < 0.90               NEGATIVE                    0.90 - 1.09          EQUIVOCAL                    > 1.09               POSITIVE This assay utilizes recombinant type-specific antigens to differentiate HSV-1 from HSV-2 infections. A positive result cannot distinguish between recent and past infection. If recent HSV infection is suspected but the results are negative or equivocal, the assay should be repeated in 4-6 weeks. The performance characteristics of the assay have not been established for pediatric populations, immunocompromised patients, or neonatal screening.      Herpes Simplex Vrs I&II-IgM Ab (EIA) 1.36 (H) INDEX    Comment:                 <=0.90 . . . . . . Marland Kitchen NEGATIVE               0.91-1.09. . . . . Marland Kitchen EQUIVOCAL               >=1.10 . . . . . . Marland Kitchen POSITIVE  The results obtained with the HSV 1 & 2 IgM test should serve only as an aid to diagnosis and should not be interpreted as diagnostic by themselves. Heterotypic IgM antibody responses may occur in patients with a history of infection with other Herpes viruses, including Epstein-Barr virus and Varicella zoster virus, and give false positive results in HSV 1 & 2 IgM tests.  This test does not distinguish between HSV 1 and HSV 2.  A positive HSV IgM may indicate primary infection, but IgM antibody can persist 12 or more months after primary infection.  For confirmation, if clinically indicated, positive IgM results could be followed with the test for HSV 1 & 2 IgG glycoprotein (test code [phone]) in 4-6 weeks.       Physical Exam: Constitutional:  There were no vitals taken for this visit.  Musculoskeletal: Strength & Muscle Tone: within normal limits Gait & Station: normal Patient leans: N/A  Mental Status Examination;  Patient is casually dressed and  groomed.  She does not appear to be in distress.  She maintained good eye contact.  She is pleasant and cooperative.  Her speech is clear, soft, coherent with normal tone and volume.  Her thought process logical and goal-directed.  She denies any active or passive suicidal thoughts or homicidal thought.  Her attention and cognition is good.  There were no tremors, shakes or any EPS.  There were no flight of ideas or any loose association.  There were no delusions, paranoia or any obsessive thoughts.  Her psychomotor activity is good.  Her cognition is good.  She is alert and oriented 3.  Her insight judgment and impulse control is okay.  Established Problem, Stable/Improving (1), Review of Last Therapy Session (1) and Review of Medication Regimen & Side Effects (2)  Assessment: Axis I: Maj. depressive disorder, recurrent  Axis II: Deferred  Axis III:  Past Medical History:  Diagnosis Date  . ASCUS (atypical squamous cells of undetermined significance) on Pap smear   . Depression   . Kidney stones   . Migraines    Plan:  Patient is doing better on her current psychiatric medication.  I will continue Cymbalta 90 mg daily and trazodone 50 mg at bedtime.  She will have blood work tomorrow morning for her yearly physical checkup.  We will schedule appointment with Pilar Plate in this office for coping and social skills.  Discussed medication side effects and benefits.  Recommended to call us back if there is any question, concern or worsening of the symptoms.  Discuss safety plan that anytime having active suicidal thoughts or homicidal thoughts and she need to call 911 or go to the local emergency room.  Follow-up in 3 months.  Braxley Balandran T., MD 12/15/2015

## 2015-12-16 DIAGNOSIS — I00 Rheumatic fever without heart involvement: Secondary | ICD-10-CM | POA: Diagnosis not present

## 2015-12-16 DIAGNOSIS — E784 Other hyperlipidemia: Secondary | ICD-10-CM | POA: Diagnosis not present

## 2015-12-16 DIAGNOSIS — F325 Major depressive disorder, single episode, in full remission: Secondary | ICD-10-CM | POA: Diagnosis not present

## 2015-12-16 DIAGNOSIS — Z0000 Encounter for general adult medical examination without abnormal findings: Secondary | ICD-10-CM | POA: Diagnosis not present

## 2015-12-16 DIAGNOSIS — Z1389 Encounter for screening for other disorder: Secondary | ICD-10-CM | POA: Diagnosis not present

## 2015-12-16 DIAGNOSIS — Z6833 Body mass index (BMI) 33.0-33.9, adult: Secondary | ICD-10-CM | POA: Diagnosis not present

## 2015-12-23 DIAGNOSIS — M7701 Medial epicondylitis, right elbow: Secondary | ICD-10-CM | POA: Diagnosis not present

## 2015-12-23 DIAGNOSIS — M542 Cervicalgia: Secondary | ICD-10-CM | POA: Diagnosis not present

## 2015-12-25 MED FILL — traZODone HCL 50 MG TABS: 50 | 90 days supply | Qty: 90 | Fill #0

## 2015-12-31 DIAGNOSIS — G5621 Lesion of ulnar nerve, right upper limb: Secondary | ICD-10-CM | POA: Diagnosis not present

## 2016-01-07 MED FILL — DASETTA 1-35-28 TABLET: 1-35 | 84 days supply | Qty: 112 | Fill #1

## 2016-01-11 DIAGNOSIS — G5621 Lesion of ulnar nerve, right upper limb: Secondary | ICD-10-CM | POA: Diagnosis not present

## 2016-01-19 DIAGNOSIS — G5621 Lesion of ulnar nerve, right upper limb: Secondary | ICD-10-CM | POA: Diagnosis not present

## 2016-01-19 MED FILL — SUMATRIPTAN SUCC 100 MG TAB: 100 | 30 days supply | Qty: 9 | Fill #0

## 2016-01-19 MED FILL — ZONISAMIDE 100 MG CAPSULE: 100 | 90 days supply | Qty: 270 | Fill #1

## 2016-01-28 DIAGNOSIS — G5621 Lesion of ulnar nerve, right upper limb: Secondary | ICD-10-CM | POA: Diagnosis not present

## 2016-02-03 ENCOUNTER — Ambulatory Visit (INDEPENDENT_AMBULATORY_CARE_PROVIDER_SITE_OTHER): Payer: 59 | Admitting: Clinical

## 2016-02-03 ENCOUNTER — Encounter (HOSPITAL_COMMUNITY): Payer: Self-pay | Admitting: Clinical

## 2016-02-03 DIAGNOSIS — F331 Major depressive disorder, recurrent, moderate: Secondary | ICD-10-CM | POA: Diagnosis not present
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
## 2016-02-19 DIAGNOSIS — G5621 Lesion of ulnar nerve, right upper limb: Secondary | ICD-10-CM | POA: Diagnosis not present

## 2016-02-24 DIAGNOSIS — M542 Cervicalgia: Secondary | ICD-10-CM | POA: Diagnosis not present

## 2016-02-24 DIAGNOSIS — M7701 Medial epicondylitis, right elbow: Secondary | ICD-10-CM | POA: Diagnosis not present

## 2016-02-24 MED FILL — DULoxetine HCL 30 MG CPEP: 30 | 90 days supply | Qty: 270 | Fill #0

## 2016-02-24 MED FILL — SUMATRIPTAN SUCC 100 MG TAB: 100 | 30 days supply | Qty: 9 | Fill #1

## 2016-02-25 ENCOUNTER — Ambulatory Visit (INDEPENDENT_AMBULATORY_CARE_PROVIDER_SITE_OTHER): Payer: 59 | Admitting: Clinical

## 2016-02-25 DIAGNOSIS — F331 Major depressive disorder, recurrent, moderate: Secondary | ICD-10-CM | POA: Diagnosis not present

## 2016-02-25 NOTE — Progress Notes (Signed)
   THERAPIST PROGRESS NOTE  Session Time: 11:05 -12:00   Participation Level: Active  Behavioral Response: CasualAlertDepressed  Type of Therapy: Individual Therapy  Treatment Goals addressed: improve psychiatric symptoms, elevate mood (increased social interactions, increased interest), improve unhelpful thought patterns, increased self esteem, learn about diagnosis, healthy coping skills   Interventions: Motivational interviewing, CBT, grounding techniques, mindfulness techniques  Summary: [name] is a 41 y.o. female who presents with Major Depressive Disorder, recurrent, moderate.   Suicidal/Homicidal: Nowithout intent/plan  Therapist Response: [name] met with clinician for an individual session. She discussed her psychiatric symptoms, her current life events, and her goals for therapy. [name] shared that she has been very depressed. Clinician asked open ended questions and [name] was tearful as she shared about her emotions and negative automatic thoughts. Clinician introduced some grounding and mindfulness techniques. Clinician shared the process purpose and practice of the techniques. Client and clinician practice some techniques together. Clinician introduced some basic CBT concepts. Client and clinician discussed the thought emotion connection. Client and clinician began a discussion on  How to challenge and change her thoughts. Clinician gave her 2 homework packets on depression. She agreed to complete the homework and bring it back with her to next session.  Plan: Return again in 1-2 weeks.  Diagnosis: Axis I: Major Depressive Disorder, recurrent, moderate.        Baldomero Mirarchi A, LCSW 02/25/2016

## 2016-03-01 ENCOUNTER — Encounter (HOSPITAL_COMMUNITY): Payer: Self-pay | Admitting: Clinical

## 2016-03-08 DIAGNOSIS — G43839 Menstrual migraine, intractable, without status migrainosus: Secondary | ICD-10-CM | POA: Diagnosis not present

## 2016-03-08 DIAGNOSIS — G43109 Migraine with aura, not intractable, without status migrainosus: Secondary | ICD-10-CM | POA: Diagnosis not present

## 2016-03-08 DIAGNOSIS — G43019 Migraine without aura, intractable, without status migrainosus: Secondary | ICD-10-CM | POA: Diagnosis not present

## 2016-03-08 MED FILL — PERPHENAZINE 4 MG TABLET: 4 | 5 days supply | Qty: 30 | Fill #0

## 2016-03-08 MED FILL — ZOMIG 5 MG NASAL SPRAY: 5 | 30 days supply | Qty: 6 | Fill #0

## 2016-03-09 ENCOUNTER — Encounter (HOSPITAL_COMMUNITY): Payer: Self-pay | Admitting: Clinical

## 2016-03-09 ENCOUNTER — Ambulatory Visit (INDEPENDENT_AMBULATORY_CARE_PROVIDER_SITE_OTHER): Payer: 59 | Admitting: Clinical

## 2016-03-09 DIAGNOSIS — F331 Major depressive disorder, recurrent, moderate: Secondary | ICD-10-CM

## 2016-03-09 NOTE — Progress Notes (Signed)
   THERAPIST PROGRESS NOTE  Session Time: 1:30 - 2:25  Participation Level: Active  Behavioral Response: CasualAlertDepressed  Type of Therapy: Individual Therapy  Treatment Goals addressed: improve psychiatric symptoms, elevate mood , improve unhelpful thought patterns,  healthy coping skills   Interventions: Motivational interviewing, CBT,  Summary: [name] is a 41 y.o. female who presents with Major Depressive Disorder, recurrent, moderate.   Suicidal/Homicidal: Nowithout intent/plan  Therapist Response: [name] met with clinician for an individual session. She discussed her psychiatric symptoms, her current life events, and her goals for therapy. [name] shared that she has been very depressed. She shared that she was having difficulty with her mood, which in turn causes her to have difficulty with her husband. Clinician asked open ended questions. [name] shared her experience. Clinician introduced a 7 panel thought record sheet. Clinician explain how the worksheet would be used. Clinician asked open end questions and [name] filled out the work sheet. She identified the event, her emotions and rated them. She identified her negative automatic thoughts. Clinician asked open ended questions and [name] identified the evidence for and against the negative automatic thoughts. She was then able to formulate healthier alternative thoughts. She then re re-rated her emotions at a much lower. Client and clinician reviewed and discussed the process, her thoughts and insights.    [name] A, LCSW 03/09/2016

## 2016-03-16 ENCOUNTER — Encounter (HOSPITAL_COMMUNITY): Payer: Self-pay | Admitting: Psychiatry

## 2016-03-16 ENCOUNTER — Ambulatory Visit (INDEPENDENT_AMBULATORY_CARE_PROVIDER_SITE_OTHER): Payer: 59 | Admitting: Psychiatry

## 2016-03-16 DIAGNOSIS — Z8249 Family history of ischemic heart disease and other diseases of the circulatory system: Secondary | ICD-10-CM | POA: Diagnosis not present

## 2016-03-16 DIAGNOSIS — Z888 Allergy status to other drugs, medicaments and biological substances status: Secondary | ICD-10-CM

## 2016-03-16 DIAGNOSIS — Z9049 Acquired absence of other specified parts of digestive tract: Secondary | ICD-10-CM

## 2016-03-16 DIAGNOSIS — Z79899 Other long term (current) drug therapy: Secondary | ICD-10-CM

## 2016-03-16 DIAGNOSIS — Z8349 Family history of other endocrine, nutritional and metabolic diseases: Secondary | ICD-10-CM

## 2016-03-16 DIAGNOSIS — Z91040 Latex allergy status: Secondary | ICD-10-CM

## 2016-03-16 DIAGNOSIS — Z818 Family history of other mental and behavioral disorders: Secondary | ICD-10-CM | POA: Diagnosis not present

## 2016-03-16 DIAGNOSIS — F330 Major depressive disorder, recurrent, mild: Secondary | ICD-10-CM

## 2016-03-16 DIAGNOSIS — Z800 Family history of malignant neoplasm of digestive organs: Secondary | ICD-10-CM | POA: Diagnosis not present

## 2016-03-16 DIAGNOSIS — Z9889 Other specified postprocedural states: Secondary | ICD-10-CM | POA: Diagnosis not present

## 2016-03-16 MED ORDER — DULOXETINE HCL 30 MG PO CPEP: 90.0000 mg | ORAL_CAPSULE | Freq: Every day | ORAL | 1 refills | Status: DC

## 2016-03-16 MED ORDER — TRAZODONE HCL 50 MG PO TABS: 50.0000 mg | ORAL_TABLET | Freq: Every day | ORAL | 1 refills | Status: DC

## 2016-03-16 MED FILL — traZODone HCL 50 MG TABS: 50 | 90 days supply | Qty: 90 | Fill #0

## 2016-03-16 NOTE — Progress Notes (Signed)
BH MD/PA/NP OP Progress Note  03/16/2016 2:19 PM [name]  MRN:  [id number]  Chief Complaint:  Chief Complaint    Follow-up     Subjective:  I was down last month due to cold weather but I'm not feeling better.  HPI: [name] came for her follow-up appointment.  She admitted last month she was down due to cold and winter because she could not go outside and unable to run.  Now she started running and playing tennis and feeling much better.  She like her current psychiatric medication and does not want to change it.  She is hoping to pursue.  In 26 minute med upon in October which will be held in [place].  She is seeing Tharon Aquas on a regular basis.  Her sleep is good.  She still have migraine headaches and now her neurologist wanted her to try Trilafon.  She was told if she failed Trilafon then she may be considered for Botox.  Overall she described her mood is getting better.  She has good energy level.  She denies any irritability, anger, mania, psychosis or any hallucination.  She is hopeful denies any feeling of worthlessness and hopelessness.  She denies any suicidal thoughts.  Her appetite is okay.  Her vital signs are stable.  Visit Diagnosis:    ICD-9-CM ICD-10-CM   1. Major depressive disorder, recurrent episode, mild (HCC) 296.31 F33.0 traZODone (DESYREL) 50 MG tablet     DULoxetine (CYMBALTA) 30 MG capsule    Past Psychiatric History: Patient has history of depression since her teens.  In the past she had tried Paxil, Lunesta for insomnia.  She denies any history of suicidal attempt, inpatient psychiatric treatment, psychosis or any hallucination.  Past Medical History:  Past Medical History:  Diagnosis Date  . ASCUS (atypical squamous cells of undetermined significance) on Pap smear   . Depression   . Kidney stones   . Migraines     Past Surgical History:  Procedure Laterality Date  . ANKLE SURGERY  1996   RECONSTRUCTION   . APPENDECTOMY    . BREAST  SURGERY  2004   REDUCTION BY DR. Stephanie Coup  . CHOLECYSTECTOMY  2003    Family Psychiatric History: Reviewed.  Family History:  Family History  Problem Relation Age of Onset  . Hypertension Father   . Depression Father   . Depression Other   . Hyperlipidemia Mother   . Cancer Maternal Grandmother     COLON  . Depression Maternal Grandmother   . Alzheimer's disease Maternal Grandmother   . Hyperlipidemia Maternal Grandmother   . Cancer Maternal Grandfather     COLON  . Depression Maternal Aunt   . Depression Paternal Grandmother   . Hypertension Paternal Grandfather     Social History:  Social History   Social History  . Marital status: Married    Spouse name: N/A  . Number of children: N/A  . Years of education: N/A   Social History Main Topics  . Smoking status: Never Smoker  . Smokeless tobacco: Never Used  . Alcohol use 0.0 oz/week     Comment: Rare  . Drug use: No  . Sexual activity: Yes    Birth control/ protection: Pill     Comment: 1st intercourse 44 yo-5 partners   Other Topics Concern  . Not on file   Social History Narrative  . No narrative on file    Allergies:  Allergies  Allergen Reactions  . Compazine   .  Imipramine   . Other     LATEX ALLERGY    Metabolic Disorder Labs: Lab Results  Component Value Date   HGBA1C 5.1 11/03/2011   MPG 100 11/03/2011   No results found for: PROLACTIN Lab Results  Component Value Date   CHOL 230 (H) 10/17/2012   TRIG 139 10/17/2012   HDL 75 10/17/2012   CHOLHDL 3.1 10/17/2012   VLDL 28 10/17/2012   LDLCALC 127 (H) 10/17/2012   LDLCALC 127 (H) 09/21/2012     Current Medications: Current Outpatient Prescriptions  Medication Sig Dispense Refill  . augmented betamethasone dipropionate (DIPROLENE AF) 0.05 % cream Apply topically 2 (two) times daily. (Patient not taking: Reported on 02/03/2016) 30 g 0  . DULoxetine (CYMBALTA) 30 MG capsule Take 3 capsules (90 mg total) by mouth daily. Take 3 daily 270  capsule 1  . Magnesium 250 MG TABS Take 250 mg by mouth 3 (three) times daily.      . norethindrone-ethinyl estradiol 1/35 (ORTHO-NOVUM, [place]) tablet Patient takes active pills only skipping placebo pills 4 Package 3  . Omega-3 Fatty Acids (OMEGA 3 PO) Take by mouth.      . perphenazine (TRILAFON) 4 MG tablet   0  . SUMAtriptan (IMITREX) 100 MG tablet Take 100 mg by mouth as needed.      . traZODone (DESYREL) 50 MG tablet Take 1 tablet (50 mg total) by mouth at bedtime. 90 tablet 1  . ZOMIG 5 MG nasal solution   3  . zonisamide (ZONEGRAN) 100 MG capsule Take 300 mg by mouth daily.      No current facility-administered medications for this visit.     Neurologic: Headache: Yes Seizure: No Paresthesias: No  Musculoskeletal: Strength & Muscle Tone: within normal limits Gait & Station: normal Patient leans: N/A  Psychiatric Specialty Exam: Review of Systems  Constitutional: Negative.   HENT: Negative.   Respiratory: Negative for cough.   Cardiovascular: Negative for chest pain.  Musculoskeletal: Negative.   Skin: Negative.   Neurological: Negative.   Psychiatric/Behavioral: Negative.     Blood pressure 126/74, pulse (!) 106, height 5\' 8"  (1.727 m), weight 228 lb 3.2 oz (103.5 kg).Body mass index is 34.7 kg/m.  General Appearance: Casual  Eye Contact:  Good  Speech:  Clear and Coherent and Normal Rate  Volume:  Normal  Mood:  Euthymic  Affect:  Appropriate and Congruent  Thought Process:  Goal Directed  Orientation:  Full (Time, Place, and Person)  Thought Content: WDL and Logical   Suicidal Thoughts:  No  Homicidal Thoughts:  No  Memory:  Immediate;   Good Recent;   Good Remote;   Good  Judgement:  Good  Insight:  Good  Psychomotor Activity:  Normal  Concentration:  Concentration: Good and Attention Span: Good  Recall:  Good  Fund of Knowledge: Good  Language: Good  Akathisia:  No  Handed:  Right  AIMS (if indicated):  0  Assets:  Communication  Skills Desire for Improvement Financial Resources/Insurance Housing Leisure Time Physical Health Resilience Social Support Transportation  ADL's:  Intact  Cognition: WNL  Sleep:  Good     Assessment: Major depressive disorder, recurrent mild.  Plan: Patient is doing better since weather is change.  She started running and playing tennis.  She is also seeing Pilar Plate in a regular basis.  I will continue Cymbalta 90 mg daily and trazodone 50 mg at bedtime.  I discussed that she was given Trilafon for headache with his antipsychotic and  reminded side effects of antipsychotic medication .  Recommended to call us back if she has any question, concern or if she feels worsening of the symptom.  Follow-up in 6 months.   Shaeleigh Graw T., MD 03/16/2016, 2:19 PM

## 2016-03-17 ENCOUNTER — Ambulatory Visit (INDEPENDENT_AMBULATORY_CARE_PROVIDER_SITE_OTHER): Payer: 59 | Admitting: Gynecology

## 2016-03-17 ENCOUNTER — Encounter: Payer: Self-pay | Admitting: Gynecology

## 2016-03-17 ENCOUNTER — Other Ambulatory Visit: Payer: Self-pay | Admitting: Gynecology

## 2016-03-17 VITALS — BP 124/76

## 2016-03-17 DIAGNOSIS — B3731 Acute candidiasis of vulva and vagina: Secondary | ICD-10-CM

## 2016-03-17 DIAGNOSIS — B373 Candidiasis of vulva and vagina: Secondary | ICD-10-CM | POA: Diagnosis not present

## 2016-03-17 DIAGNOSIS — Z8619 Personal history of other infectious and parasitic diseases: Secondary | ICD-10-CM | POA: Diagnosis not present

## 2016-03-17 LAB — WET PREP FOR TRICH, YEAST, CLUE
CLUE CELLS WET PREP: NONE SEEN
Trich, Wet Prep: NONE SEEN

## 2016-03-17 MED ORDER — CLOBETASOL PROPIONATE 0.05 % EX CREA: TOPICAL_CREAM | CUTANEOUS | 1 refills | Status: DC

## 2016-03-17 MED ORDER — FLUCONAZOLE 200 MG PO TABS: 200.0000 mg | ORAL_TABLET | Freq: Every day | ORAL | 0 refills | Status: DC

## 2016-03-17 MED FILL — FLUCONAZOLE 200 MG TABLET: 200 | 5 days supply | Qty: 5 | Fill #0

## 2016-03-17 MED FILL — CLOBETASOL 0.05% CREAM: 0.05 | 30 days supply | Qty: 30 | Fill #0

## 2016-03-17 NOTE — Addendum Note (Signed)
Addended by: Nelva Nay on: 03/17/2016 02:16 PM   Modules accepted: Orders

## 2016-03-17 NOTE — Patient Instructions (Signed)
Take the Diflucan medication daily for 5 days Use the clobetasol cream externally twice daily as needed for irritation.  Follow up if your symptoms persist, worsen or recur.  I will call you with the blood test results

## 2016-03-17 NOTE — Progress Notes (Signed)
    [name] 1975/11/25 [id number]        41 y.o.  G0P0 presents with several months of vulvar itching. Patient initially linked this to the use of latex condoms but has switched to latex free and continues to have irritation and itching. No discharge/odor. No urinary symptoms such as frequency dysuria or urgency low back pain fever or chills.  Past medical history,surgical history, problem list, medications, allergies, family history and social history were all reviewed and documented in the EPIC chart.  Directed ROS with pertinent positives and negatives documented in the history of present illness/assessment and plan.  Exam: Caryn Bee assistant Vitals:   03/17/16 1148  BP: 124/76   General appearance:  Normal Abdomen soft nontender without masses or guarding. Pelvic external BUS vagina with generalized hyperkeratotic appearing area mid perineal body. No lesions, or pigmented changes. Vagina with slight white discharge. Cervix normal. Uterus normal size midline mobile nontender. Adnexa without masses or tenderness.  Assessment/Plan:  41 y.o. G0P0 with the above history and exam. Wet prep is positive for yeast. Will treat with Diflucan 200 mg daily 5 days to eradicate cutaneous yeast and clobetasol 0.05% cream twice daily as needed for irritation to break the scratch itch cycle. Follow up if symptoms persist, worsen or recur. Patient did have positive IgG HSV-1 antibodies and negative HSV-2 antibodies in August. Her batched HSV-1 and 2 IgM's were positive. As per 09/24/2015 note will repeat her lab values now.    Anastasio Auerbach MD, 11:57 AM 03/17/2016

## 2016-03-18 LAB — HSV(HERPES SIMPLEX VRS) I + II AB-IGG
HSV 1 Glycoprotein G Ab, IgG: 2.95 Index — ABNORMAL HIGH (ref <0.90)
HSV 2 Glycoprotein G Ab, IgG: 1.84 Index — ABNORMAL HIGH (ref <0.90)

## 2016-03-22 LAB — HSV 1/2 AB (IGM), IFA W/RFLX TITER
HSV 1 IGM SCREEN: NEGATIVE
HSV 2 IGM SCREEN: NEGATIVE

## 2016-03-23 ENCOUNTER — Other Ambulatory Visit (HOSPITAL_COMMUNITY): Payer: Self-pay

## 2016-03-23 ENCOUNTER — Telehealth: Payer: Self-pay | Admitting: Gynecology

## 2016-03-23 ENCOUNTER — Ambulatory Visit (INDEPENDENT_AMBULATORY_CARE_PROVIDER_SITE_OTHER): Payer: 59 | Admitting: Clinical

## 2016-03-23 DIAGNOSIS — F331 Major depressive disorder, recurrent, moderate: Secondary | ICD-10-CM

## 2016-03-23 DIAGNOSIS — F330 Major depressive disorder, recurrent, mild: Secondary | ICD-10-CM

## 2016-03-23 MED ORDER — DULOXETINE HCL 30 MG PO CPEP: 30.0000 mg | ORAL_CAPSULE | Freq: Every day | ORAL | 1 refills | Status: DC

## 2016-03-23 MED ORDER — DULOXETINE HCL 60 MG PO CPEP: 60.0000 mg | ORAL_CAPSULE | Freq: Every day | ORAL | 1 refills | Status: DC

## 2016-03-23 NOTE — Telephone Encounter (Signed)
I called patient in follow up of her HSV 1 and 2 IgG and IgM screening. Her HSV-1 and HSV-2 IgG's were both positive. Her HSV 1 and HSV-2 IgM's were both negative. 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 We reviewed what this all means. We also discussed the implications as far as Valtrex prophylactically both for her husband or for her versus waiting and seeing if outbreaks aren't issue. Also the issues as far as condoms also discussed. Patient will call me if she has any further questions or concerns.

## 2016-03-23 NOTE — Progress Notes (Signed)
   THERAPIST PROGRESS NOTE  Session Time: 1:30 -2:25  Participation Level: Active  Behavioral Response: CasualAlertDepressed  Type of Therapy: Individual Therapy  Treatment Goals addressed: improve psychiatric symptoms, elevate mood , improve unhelpful thought patterns,  healthy coping skills   Interventions:Motivational interviewing, CBT, psychoeducation  Summary: [name] a 41 y.o.femalewho presents with Major Depressive Disorder, recurrent, moderate.   Suicidal/Homicidal:Nowithout intent/plan  Therapist ResponseBenjamine Mola met with clinician for an individual session. She discussed her psychiatric symptoms, her current life events, and her her homework. [name] shared she had felt better after last session but that the last several days had been difficult with her depression. Clinician asked open ended questions. [name] shared that she had completed her homework packet on self compassion (cbt based to help elevate mood). She shared that she recognized that she talks very negatively to her self. Clinician asked open ended questions about the negative thoughts she has about herself. She identified them, how they made her feel, the evidence for and the evidence against. She was then able to formulate healthier alternative thoughts. Client and clinician discussed how changing her thoughts would change how she feels about herself and how she interacts. Client and clinician reviewed her grounding and mindfulness techniques which she agreed to continue to practice in addition to completing the number 2 of self compassion and some 7 panel thought record sheets.    Zeeshan Korte A, LCSW 03/23/2016

## 2016-03-28 ENCOUNTER — Encounter (HOSPITAL_COMMUNITY): Payer: Self-pay | Admitting: Clinical

## 2016-03-31 MED FILL — DASETTA 1-35-28 TABLET: 1-35 | 84 days supply | Qty: 112 | Fill #2

## 2016-03-31 MED FILL — SUMATRIPTAN SUCC 100 MG TAB: 100 | 30 days supply | Qty: 9 | Fill #0

## 2016-04-04 ENCOUNTER — Ambulatory Visit (HOSPITAL_COMMUNITY): Payer: Self-pay | Admitting: Clinical

## 2016-04-11 ENCOUNTER — Ambulatory Visit (INDEPENDENT_AMBULATORY_CARE_PROVIDER_SITE_OTHER): Payer: 59 | Admitting: Clinical

## 2016-04-11 ENCOUNTER — Encounter (HOSPITAL_COMMUNITY): Payer: Self-pay | Admitting: Clinical

## 2016-04-11 DIAGNOSIS — F331 Major depressive disorder, recurrent, moderate: Secondary | ICD-10-CM

## 2016-04-11 NOTE — Progress Notes (Signed)
THERAPIST PROGRESS NOTE  Session Time: 1:29 - 2:25   Participation Level: Active  Behavioral Response: CasualAlertDepressed  Type of Therapy: Individual Therapy  Treatment Goals addressed: reduce psychiatric symptoms, elevate mood (increased social interactions), improve unhelpful thought patterns,  healthy coping skills   Interventions: CBT and Motivational Interviewing, grounding and mindfulness techniques  Summary: [name] is a 41 y.o. female who presents with Major depressive disorder, recurrent episode, moderate  Suicidal/Homicidal: No -without intent/plan  Therapist Response: Emanuela met with clinician for an individual session. Keshauna discussed her psychiatric symptoms, her current life events and her homework. Starlette shared that she did not complete her homework. Kasee shared that she has been feeling slightly better this past week. She shared that she has been using her skills. She shared that she went out with her husband (going out has been challenging for her). She shared what went well and what she had a challenge with. Clinician asked open ended questions and Shilah shared about some of her unhelpful thoughts and emotions. Clinician asked open ended questions and Aseel identified the evidence for and against the unhelpful thoughts. She was then able to formulate healthier alternative thoughts. Gael reported that her emotions would improve if she believed the healthier thought. She had the insight that the same unhelpful thoughts were showing up in other aspects of her life and causing her stress and depression to increase. Client and clinician discussed this insight and how to address this pattern. Clinician taught her a new grounding technique. Which client and clinician practiced together.   Plan: Return in 1-2 weeks.  Diagnosis: Axis I: Major depressive disorder, recurrent episode, moderate    Erich Kochan A, LCSW 04/11/2016

## 2016-04-18 ENCOUNTER — Ambulatory Visit (INDEPENDENT_AMBULATORY_CARE_PROVIDER_SITE_OTHER): Payer: 59 | Admitting: Clinical

## 2016-04-18 ENCOUNTER — Encounter (HOSPITAL_COMMUNITY): Payer: Self-pay | Admitting: Clinical

## 2016-04-18 DIAGNOSIS — F331 Major depressive disorder, recurrent, moderate: Secondary | ICD-10-CM

## 2016-04-18 NOTE — Progress Notes (Addendum)
   THERAPIST PROGRESS NOTE  Session Time: 2:42 -2:50  Participation Level: Active  Behavioral Response: CasualAlertDepressed  Type of Therapy: Individual Therapy  Treatment Goals addressed: reduce psychiatric symptoms, elevate mood , improve unhelpful thought patterns, learn about diagnosis, healthy coping skills   Interventions: CBT and Motivational Interviewing, psychoeducation  Summary: [name] is a 41 y.o. female who presents with Major depressive disorder, recurrent episode, moderate  Suicidal/Homicidal: No -without intent/plan  Therapist Response: Mayda met with clinician for an individual session. Anabia discussed her psychiatric symptoms, her current life events and her homework. Jomayra shared that she has done better on some days but also had some depressive days. Client and clinician discussed and reviewed her homework packets #5 depression and #2 self compassion ( her negative self talk increases her depression). Client and clinician discussed unhelpful thinking styles. Kindsey identified examples from her own life to demonstrate the unhelpful thinking styles. Clinician asked open ended questions and client identified ways to  improve unhelpful thinking styles. Client and clinician discussed self criticism. Clinician asked open ended questions and Tishie shared about her self criticism, her thoughts about her positive beliefs about it. She shared her negative beliefs about self compassion. Client and clinician discussed how our thoughts affect our emotions and actions. Client and clinician discussed the benefit of improving her thoughts.  Clinician gave her packet 6 depression and 3 self compassion.   Plan: Return in 1-2 weeks.  Diagnosis: Axis I: Major depressive disorder, recurrent episode, moderate   Azia Toutant A, LCSW 04/18/2016

## 2016-04-20 ENCOUNTER — Encounter (HOSPITAL_COMMUNITY): Payer: Self-pay | Admitting: Clinical

## 2016-04-20 DIAGNOSIS — M542 Cervicalgia: Secondary | ICD-10-CM | POA: Diagnosis not present

## 2016-04-20 DIAGNOSIS — M7701 Medial epicondylitis, right elbow: Secondary | ICD-10-CM | POA: Diagnosis not present

## 2016-04-20 MED FILL — ZONISAMIDE 100 MG CAPSULE: 100 | 90 days supply | Qty: 270 | Fill #0

## 2016-05-02 ENCOUNTER — Ambulatory Visit (INDEPENDENT_AMBULATORY_CARE_PROVIDER_SITE_OTHER): Payer: 59 | Admitting: Clinical

## 2016-05-02 ENCOUNTER — Encounter (HOSPITAL_COMMUNITY): Payer: Self-pay | Admitting: Clinical

## 2016-05-02 DIAGNOSIS — F331 Major depressive disorder, recurrent, moderate: Secondary | ICD-10-CM

## 2016-05-02 NOTE — Progress Notes (Signed)
   THERAPIST PROGRESS NOTE  Session Time: 1:30 -2:25  Participation Level: Active  Behavioral Response: CasualAlertDepressed  Type of Therapy: Individual Therapy  Treatment Goals addressed: reduce psychiatric symptoms, elevate mood, improve unhelpful thought patterns,  learn about diagnosis, healthy coping skills   Interventions: CBT and Motivational Interviewing, grounding and mindfulness techniques, psychoeducation  Summary: [name] is a 41 y.o. female who presents with Major depressive disorder, recurrent episode, moderate  Suicidal/Homicidal: No -without intent/plan  Therapist Response: Kathlynn met with clinician for an individual session. Samanvitha discussed her psychiatric symptoms, her current life events and her homework. Dierra shared that she continues to feel depressed though it has not been as bad this past week. Wallis and clinician reviewed and discussed her homework - depression packet 6 and Self compassion packet 3. Clinician asked open ended questions and Hodan shared her thoughts and insights from her homework. She shared how the information related to her. She shared some the negative thoughts that has been resurfacing. Clinician asked open ended questions. Lilliane identified the evidence for and against the thoughts. She was then able to formulate healthier alternative thoughts. Client and clinician discussed how her emotions and actions might change if she believed the healthier alternative thoughts. Client and clinician discussed the process and her insights. Clinician gave Milinda the next packets in the series which she agreed to complete before next session.   Plan: Return in 1-2 weeks.  Diagnosis: Axis I: Major depressive disorder, recurrent episode, moderate  Jennie Bolar A, LCSW 05/02/2016

## 2016-05-05 MED FILL — SUMATRIPTAN SUCC 100 MG TAB: 100 | 30 days supply | Qty: 9 | Fill #1

## 2016-05-13 MED FILL — ZOMIG 5 MG NASAL SPRAY: 5 | 30 days supply | Qty: 6 | Fill #1

## 2016-05-16 ENCOUNTER — Encounter (HOSPITAL_COMMUNITY): Payer: Self-pay | Admitting: Clinical

## 2016-05-16 ENCOUNTER — Ambulatory Visit (INDEPENDENT_AMBULATORY_CARE_PROVIDER_SITE_OTHER): Payer: 59 | Admitting: Clinical

## 2016-05-16 DIAGNOSIS — F331 Major depressive disorder, recurrent, moderate: Secondary | ICD-10-CM

## 2016-05-16 NOTE — Progress Notes (Signed)
   THERAPIST PROGRESS NOTE  Session Time: 1:31 - 2:30   Participation Level: Active  Behavioral Response: CasualAlertDepressed  Type of Therapy: Individual Therapy  Treatment Goals addressed: reduce psychiatric symptoms, elevate mood , improve unhelpful thought patterns, increase self esteem, learn about diagnosis, healthy coping skills   Interventions: CBT and Motivational Interviewing, psychoeducation  Summary: [name] is a 41 y.o. female who presents with Major depressive disorder, recurrent episode, moderate  Suicidal/Homicidal: No -without intent/plan  Therapist Response: Manasa met with clinician for an individual session. [name] discussed her psychiatric symptoms, her current life events and her homework. [name] shared that she had several good days and some depressive days since the last session. She shared that she completed her homework packets. Client and clinician reviewed and discussed Depression 7. Client and clinician reviewed and discussed the thought diary she completed. Client and clinician reviewed and discussed self compassion packet 4. [name] shared that she had not completed the packet as one of the exercises as she became tearful when attempting to do the exercise. Client and clinician reviewed the exercise. [name] shared the negative automatic thoughts that surfaced about her when attempting the exercise. Client and clinician discussed the evidence for and against the negative thoughts. She was then able to formulate healthier alternative thoughts. She then agreed to complete the packet before next session in addition to depression 8 and   And compassion 5.  Plan: Return in 1-2 weeks.  Diagnosis: Axis I: Major depressive disorder, recurrent episode, moderate    Rosamae Rocque A, LCSW 05/16/2016

## 2016-05-23 ENCOUNTER — Ambulatory Visit (HOSPITAL_COMMUNITY): Payer: Self-pay | Admitting: Clinical

## 2016-05-26 ENCOUNTER — Telehealth (HOSPITAL_COMMUNITY): Payer: Self-pay

## 2016-05-26 MED FILL — DULoxetine HCL 30 MG CPEP: 30 | 90 days supply | Qty: 90 | Fill #0

## 2016-05-26 MED FILL — DULoxetine HCL 60 MG CPEP: 60 | 90 days supply | Qty: 90 | Fill #0

## 2016-05-26 NOTE — Telephone Encounter (Signed)
Medication management - Telephone call with patient after speaking with [facility] with [employer] to initiate a prior authorization for Duloxetine 30 mg, 3 a day due to exceeded daily quantity.  Pt. reported this had been worked out and now taking Duloxetine 60 mg, one a day and 30 mg, one a day for a total of 90 mg.  States the only past antidepressant she can remember failing was Paxil but would like not to change due to taking 90 mg a day since 2012.  Patient to call back if any future problems getting medication.

## 2016-05-30 ENCOUNTER — Ambulatory Visit (INDEPENDENT_AMBULATORY_CARE_PROVIDER_SITE_OTHER): Payer: 59 | Admitting: Clinical

## 2016-05-30 ENCOUNTER — Encounter (HOSPITAL_COMMUNITY): Payer: Self-pay | Admitting: Clinical

## 2016-05-30 DIAGNOSIS — F331 Major depressive disorder, recurrent, moderate: Secondary | ICD-10-CM

## 2016-05-30 NOTE — Progress Notes (Signed)
   THERAPIST PROGRESS NOTE  Session Time:1:28 - 2:25  Participation Level: Active  Behavioral Response: CasualAlertDepressed  Type of Therapy: Individual Therapy  Treatment Goals addressed: reduce psychiatric symptoms, elevate mood, improve unhelpful thought patterns, increase self esteem, learn about diagnosis, healthy coping skills   Interventions: CBT and Motivational Interviewing, , psychoeducation  Summary: [name] is [name] 41 y.o. female who presents with Major depressive disorder, recurrent episode, moderate  Suicidal/Homicidal: No -without intent/plan  Therapist Response: [name] met with clinician for an individual session. [name] discussed her psychiatric symptoms, her current life events and her homework. [name] shared that she had some good days and some difficult days with her symptoms. Clinician asked open ended questions and [name] identified her thoughts about what made some days good and others bad. [name] shared about [name] core belief. Clinician asked open ended questions about the belief and when she thought it was formed. Clinician asked open ended questions and [name] shared about some childhood and young adult experiences. Client and clinician discussed her negative automatic thoughts about herself that came from those experiences. Clinician asked open ended questions and [name] was able to formulate healthier alternative perspectives about the situations and then formulate healthier beliefs about herself. Client and clinician discussed and reviewed Depression packet 8. [name] did not complete her self compassion packet which she agreed to complete before next session along with depression packet 9.   Plan: Return in 1-2 weeks.  Diagnosis: Axis I: Major depressive disorder, recurrent episode, moderate       Yolanda Nightengale A, LCSW 05/30/2016

## 2016-06-01 DIAGNOSIS — G43719 Chronic migraine without aura, intractable, without status migrainosus: Secondary | ICD-10-CM | POA: Diagnosis not present

## 2016-06-01 MED FILL — PERPHENAZINE 4 MG TABLET: 4 | 5 days supply | Qty: 30 | Fill #0

## 2016-06-01 MED FILL — SUMATRIPTAN SUCC 100 MG TAB: 100 | 30 days supply | Qty: 18 | Fill #0

## 2016-06-06 ENCOUNTER — Ambulatory Visit (INDEPENDENT_AMBULATORY_CARE_PROVIDER_SITE_OTHER): Payer: 59 | Admitting: Clinical

## 2016-06-06 ENCOUNTER — Encounter (HOSPITAL_COMMUNITY): Payer: Self-pay | Admitting: Clinical

## 2016-06-06 DIAGNOSIS — F331 Major depressive disorder, recurrent, moderate: Secondary | ICD-10-CM | POA: Diagnosis not present

## 2016-06-06 NOTE — Progress Notes (Signed)
   THERAPIST PROGRESS NOTE  Session Time: 1:30 - 2:25  Participation Level: Active  Behavioral Response: CasualAlertNA  Type of Therapy: Individual Therapy  Treatment Goals addressed: reduce psychiatric symptoms, elevate mood , improve unhelpful thought patterns, increase self esteem, learn about diagnosis, healthy coping skills   Interventions: CBT and Motivational Interviewing,  psychoeducation  Summary: [name] is a 41 y.o. female who presents with Major depressive disorder, recurrent episode, moderate  Suicidal/Homicidal: No -without intent/plan  Therapist Response: Tanisa met with clinician for an individual session. Tunisha discussed her psychiatric symptoms, her current life events and her homework. Kora shared that she has been feeling a little better since last session. Clinician asked open ended questions. Claretta shared her thoughts and insights a bout what was discussed in last session and how she applied it over this past week. Client and clinician discussed ways the on her thoughts and insights. Henretter shared that she had completed self compassion #5 and depression packet #9. Client and clinician discussed negative beliefs a bout self compassion caring for herself. Christyana shared some thoughts and insights a bout changing those beliefs. Clinician gave Benjamine Mola packet #6 of self compassion. Anyia also agreed to complete some 7 panel thought record sheets before next session.  Plan: Return in 1-2 weeks.  Diagnosis: Axis I: Major depressive disorder, recurrent episode, moderate       Tyshauna Finkbiner A, LCSW 06/06/2016

## 2016-06-13 MED FILL — BOTOX 100 UNITS VIAL: 100 | 30 days supply | Qty: 2 | Fill #0

## 2016-06-16 MED FILL — traZODone HCL 50 MG TABS: 50 | 90 days supply | Qty: 90 | Fill #1

## 2016-06-21 ENCOUNTER — Encounter (HOSPITAL_COMMUNITY): Payer: Self-pay | Admitting: Clinical

## 2016-06-21 ENCOUNTER — Ambulatory Visit (INDEPENDENT_AMBULATORY_CARE_PROVIDER_SITE_OTHER): Payer: 59 | Admitting: Clinical

## 2016-06-21 DIAGNOSIS — F331 Major depressive disorder, recurrent, moderate: Secondary | ICD-10-CM

## 2016-06-21 NOTE — Progress Notes (Signed)
   THERAPIST PROGRESS NOTE  Session Time: 1:13 - 2:08  Participation Level: Active  Behavioral Response: CasualAlertDepressed  Type of Therapy: Individual Therapy  Treatment Goals addressed: reduce psychiatric symptoms, elevate mood, improve unhelpful thought patterns, increase self esteem,  healthy coping skills   Interventions: CBT and Motivational Interviewing,  psychoeducation  Summary: [name] is [name] 41 y.o. female who presents with Major depressive disorder, recurrent episode, moderate  Suicidal/Homicidal: No -without intent/plan  Therapist Response: [name] met with clinician for an individual session. [name] discussed her psychiatric symptoms, her current life events and her homework. [name] shared that while she had been doing better, the last weeks she noticed she was becoming increasingly depressed. Clinician asked open ended questions and [name] shared that she was becoming agitated about very small things, [name] "general unsettled feeling". Clinician asked open ended questions and [name]  shared about some of her stressors and her negative automatic thoughts. [name] identified the evidence for and against the negative automatic thoughts.  Clinician asked clarifying questions. [name] expanded on her answers and then was able to formulate healthier alternative thoughts. Client and clinician discussed how her emotions and actions might change if she believed the healthier alternative thoughts, She shared that she had completed self compassion 6 - client and clinician discussed her thoughts and insights. Client and clinician discussed how self care could help improve her mood (decrease depression).   Plan: Return in 1-2 weeks.  Diagnosis: Axis I: Major depressive disorder, recurrent episode, moderate     Yolanda Nanna A, LCSW 06/21/2016

## 2016-06-22 DIAGNOSIS — M542 Cervicalgia: Secondary | ICD-10-CM | POA: Diagnosis not present

## 2016-06-22 DIAGNOSIS — G43719 Chronic migraine without aura, intractable, without status migrainosus: Secondary | ICD-10-CM | POA: Diagnosis not present

## 2016-06-22 DIAGNOSIS — M791 Myalgia: Secondary | ICD-10-CM | POA: Diagnosis not present

## 2016-06-24 MED FILL — DASETTA 1-35-28 TABLET: 1-35 | 84 days supply | Qty: 112 | Fill #3

## 2016-06-26 ENCOUNTER — Encounter (HOSPITAL_COMMUNITY): Payer: Self-pay | Admitting: Emergency Medicine

## 2016-06-26 ENCOUNTER — Ambulatory Visit (HOSPITAL_COMMUNITY)
Admission: EM | Admit: 2016-06-26 | Discharge: 2016-06-26 | Disposition: A | Discharge status: Home / Self Care | Payer: 59 | Attending: Internal Medicine | Admitting: Internal Medicine

## 2016-06-26 DIAGNOSIS — J019 Acute sinusitis, unspecified: Secondary | ICD-10-CM

## 2016-06-26 DIAGNOSIS — B9689 Other specified bacterial agents as the cause of diseases classified elsewhere: Secondary | ICD-10-CM

## 2016-06-26 DIAGNOSIS — H6692 Otitis media, unspecified, left ear: Secondary | ICD-10-CM

## 2016-06-26 DIAGNOSIS — J111 Influenza due to unidentified influenza virus with other respiratory manifestations: Secondary | ICD-10-CM

## 2016-06-26 DIAGNOSIS — R69 Illness, unspecified: Secondary | ICD-10-CM

## 2016-06-26 MED ORDER — PREDNISONE 50 MG PO TABS: 50.0000 mg | ORAL_TABLET | Freq: Every day | ORAL | 0 refills | Status: DC

## 2016-06-26 MED ORDER — BENZONATATE 200 MG PO CAPS: 200.0000 mg | ORAL_CAPSULE | Freq: Three times a day (TID) | ORAL | 1 refills | Status: DC | PRN

## 2016-06-26 MED ORDER — AMOXICILLIN-POT CLAVULANATE 875-125 MG PO TABS: 1.0000 | ORAL_TABLET | Freq: Two times a day (BID) | ORAL | 0 refills | Status: DC

## 2016-06-26 NOTE — ED Provider Notes (Signed)
[facility]    CSN: [id number] Arrival date & time: 06/26/16  1205     History   Chief Complaint Chief Complaint  Patient presents with  . Cough  . Sore Throat    HPI [name] is a 41 y.o. female. She presents today with the abrupt onset of prostration about 48 hours ago, felt fine the day before. She has had low-grade temps, up to about 100. Sinus congestion, drainage. Hard coughing. Eyes are red, and she is having mucopurulent discharge. Throat is sore. Little bit of nausea. Achiness. Malaise.    HPI  Past Medical History:  Diagnosis Date  . ASCUS (atypical squamous cells of undetermined significance) on Pap smear   . Depression   . Kidney stones   . Migraines     Patient Active Problem List   Diagnosis Date Noted  . Depression 05/30/2011    Past Surgical History:  Procedure Laterality Date  . ANKLE SURGERY  1996   RECONSTRUCTION   . APPENDECTOMY    . BREAST SURGERY  2004   REDUCTION BY DR. Stephanie Coup  . CHOLECYSTECTOMY  2003    OB History    Gravida Para Term Preterm AB Living   0             SAB TAB Ectopic Multiple Live Births                   Home Medications    Prior to Admission medications   Medication Sig Start Date End Date Taking? Authorizing Provider  amoxicillin-clavulanate (AUGMENTIN) 875-125 MG tablet Take 1 tablet by mouth every 12 (twelve) hours. 06/26/16   Sherlene Shams, MD  benzonatate (TESSALON) 200 MG capsule Take 1 capsule (200 mg total) by mouth 3 (three) times daily as needed for cough. 06/26/16   Sherlene Shams, MD  clobetasol cream (TEMOVATE) 0.05 % Apply to affected area twice daily as needed 03/17/16   Fontaine, Belinda Block, MD  DULoxetine (CYMBALTA) 30 MG capsule Take 1 capsule (30 mg total) by mouth daily. Take 3 daily 03/23/16   Arfeen, Arlyce Harman, MD  DULoxetine (CYMBALTA) 60 MG capsule Take 1 capsule (60 mg total) by mouth daily. 03/23/16 03/23/17  Arfeen, Arlyce Harman, MD  fluconazole (DIFLUCAN) 200 MG tablet Take 1  tablet (200 mg total) by mouth daily. 03/17/16   Fontaine, Belinda Block, MD  Magnesium 250 MG TABS Take 250 mg by mouth 3 (three) times daily.      [provider]  norethindrone-ethinyl estradiol 1/35 (ORTHO-NOVUM, NORTREL,CYCLAFEM) tablet Patient takes active pills only skipping placebo pills 09/23/15   Fontaine, Belinda Block, MD  Omega-3 Fatty Acids (OMEGA 3 PO) Take by mouth.      [provider]  perphenazine (TRILAFON) 4 MG tablet  03/08/16   [provider]  predniSONE (DELTASONE) 50 MG tablet Take 1 tablet (50 mg total) by mouth daily. 06/26/16   Sherlene Shams, MD  SUMAtriptan (IMITREX) 100 MG tablet Take 100 mg by mouth as needed.      [provider]  traZODone (DESYREL) 50 MG tablet Take 1 tablet (50 mg total) by mouth at bedtime. 03/16/16   Kathlee Nations, MD  ZOMIG 5 MG nasal solution  05/18/15   [provider]  zonisamide (ZONEGRAN) 100 MG capsule Take 300 mg by mouth daily.     [provider]    Family History Family History  Problem Relation Age of Onset  . Hypertension Father   .  Depression Father   . Depression Other   . Hyperlipidemia Mother   . Cancer Maternal Grandmother        COLON  . Depression Maternal Grandmother   . Alzheimer's disease Maternal Grandmother   . Hyperlipidemia Maternal Grandmother   . Cancer Maternal Grandfather        COLON  . Depression Maternal Aunt   . Depression Paternal Grandmother   . Hypertension Paternal Grandfather     Social History Social History  Substance Use Topics  . Smoking status: Never Smoker  . Smokeless tobacco: Never Used  . Alcohol use 0.0 oz/week     Comment: Rare     Allergies   Compazine and Other   Review of Systems Review of Systems  All other systems reviewed and are negative.    Physical Exam Triage Vital Signs ED Triage Vitals  Enc Vitals Group     BP 06/26/16 1255 (!) 138/92     Pulse Rate 06/26/16 1255 95     Resp 06/26/16 1255 18     Temp  06/26/16 1255 98.8 F (37.1 C)     Temp Source 06/26/16 1255 Oral     SpO2 06/26/16 1255 100 %     Weight --      Height --      Pain Score 06/26/16 1254 1     Pain Loc --    Updated Vital Signs BP (!) 138/92 (BP Location: Left Arm)   Pulse 95   Temp 98.8 F (37.1 C) (Oral)   Resp 18   SpO2 100%   Physical Exam  Constitutional: She is oriented to person, place, and time.  Looks ill but not toxic. Sitting up on the end of the exam table. Slightly tearful, voice sounds very congested  HENT:  Head: Atraumatic.  Bilateral TMs are quite dull/opaque, left TM is deep red Marked nasal congestion bilaterally Posterior pharynx is red with postnasal drainage  Eyes:  Conjugate gaze observed, and moderate bilateral conjunctival injection with scant mucopurulent discharge evident  Neck: Neck supple.  Cardiovascular: Regular rhythm.   Heart rate 110s on exam  Pulmonary/Chest: No respiratory distress. She has no wheezes. She has no rales.  Breath sounds are quite coarse but symmetric throughout Deep breath causes coughing  Abdominal: She exhibits no distension.  Musculoskeletal: Normal range of motion.  Neurological: She is alert and oriented to person, place, and time.  Skin: Skin is warm and dry.  Nursing note and vitals reviewed.    UC Treatments / Results   Procedures Procedures (including critical care time) None today  Final Clinical Impressions(s) / UC Diagnoses   Final diagnoses:  Acute left otitis media  Acute bacterial sinusitis  Influenza-like illness   Anticipate gradual improvement in well-being, fever, over the next several days.  Rest, push fluids, take ibuprofen/tylenol as needed for fever/discomfort.  Prescriptions for prednisone (for cough/congestion), benzonatate (for cough) and amoxicillin/clavulanate (antibiotic) were sent to the pharmacy.  Recheck for persistent (>3-4 more days) fever >100.5, increasing phlegm production/nasal discharge, or if not starting  to improve in a few days.     New Prescriptions New Prescriptions   AMOXICILLIN-CLAVULANATE (AUGMENTIN) 875-125 MG TABLET    Take 1 tablet by mouth every 12 (twelve) hours.   BENZONATATE (TESSALON) 200 MG CAPSULE    Take 1 capsule (200 mg total) by mouth 3 (three) times daily as needed for cough.   PREDNISONE (DELTASONE) 50 MG TABLET    Take 1 tablet (50 mg total)  by mouth daily.     Sherlene Shams, MD 06/27/16 1739

## 2016-06-26 NOTE — Discharge Instructions (Addendum)
Anticipate gradual improvement in well-being, fever, over the next several days.  Rest, push fluids, take ibuprofen/tylenol as needed for fever/discomfort.  Prescriptions for prednisone (for cough/congestion), benzonatate (for cough) and amoxicillin/clavulanate (antibiotic) were sent to the pharmacy.  Recheck for persistent (>3-4 more days) fever >100.5, increasing phlegm production/nasal discharge, or if not starting to improve in a few days.

## 2016-06-26 NOTE — ED Triage Notes (Signed)
The patient presented to the [facility] with a complaint of a cough, congestion, and left ear pain x 4 days.

## 2016-07-05 ENCOUNTER — Encounter (HOSPITAL_COMMUNITY): Payer: Self-pay | Admitting: Clinical

## 2016-07-05 ENCOUNTER — Ambulatory Visit (INDEPENDENT_AMBULATORY_CARE_PROVIDER_SITE_OTHER): Payer: 59 | Admitting: Clinical

## 2016-07-05 DIAGNOSIS — F331 Major depressive disorder, recurrent, moderate: Secondary | ICD-10-CM

## 2016-07-05 NOTE — Progress Notes (Signed)
   THERAPIST PROGRESS NOTE  Session Time: 1:33 - 2:30   Participation Level: Active  Behavioral Response: CasualAlertDepressed  Ty Type of Therapy: Individual Therapy  Treatment Goals addressed: reduce psychiatric symptoms, elevate mood, improve unhelpful thought patterns, increase self esteem,  healthy coping skills   Interventions: CBT and Motivational Interviewing,   Summary: [name] is a 41 y.o. female who presents with Major depressive disorder, recurrent episode, moderate  Suicidal/Homicidal: No -without intent/plan  Therapist Response: Analie met with clinician for an individual session. Brian discussed her psychiatric symptoms, her current life events and her homework. Rozella shared she has had some good days and some bad days with her symptoms. Clinician asked open ended questions and Toniqua shared about her good days and her successes in using the skills learned in therapy. She then shared about the thoughts and emotions that have been challenging to her. She shared that her thoughts were negatively affecting her self esteem. Clinician asked open ended questions and Marcelle shared her negative thoughts about herself. Clinician asked open ended questions and Quinita shared that she would not think her thoughts about anybody else. She shared what she would think about others. Client and clinician discussed actions she could take to challenge her thoughts about herself. Caelyn agreed to try the actions and report back. Dawnya and clinician reviewed and discussed her homework packet - self compassion  7. She shared her action plan. Clinician gave her some 7 panel thought record sheets to complete before next session.  Plan: Return in 1-2 weeks.  Diagnosis: Axis I: Major depressive disorder, recurrent episode, moderate      Glory Graefe A, LCSW 07/05/2016

## 2016-07-19 ENCOUNTER — Encounter (HOSPITAL_COMMUNITY): Payer: Self-pay | Admitting: Clinical

## 2016-07-19 ENCOUNTER — Ambulatory Visit (INDEPENDENT_AMBULATORY_CARE_PROVIDER_SITE_OTHER): Payer: 59 | Admitting: Clinical

## 2016-07-19 DIAGNOSIS — F331 Major depressive disorder, recurrent, moderate: Secondary | ICD-10-CM

## 2016-07-19 NOTE — Progress Notes (Signed)
   THERAPIST PROGRESS NOTE  Session Time: 12:54 - 2:00   Participation Level: Active  Behavioral Response: CasualAlertDepressed  Type of Therapy: Individual Therapy  Treatment Goals addressed: reduce psychiatric symptoms, elevate mood, improve unhelpful thought patterns, learn about diagnosis, healthy coping skills   Interventions: CBT and Motivational Interviewing,  psychoeducation  Summary: [name] is a 41 y.o. female who presents with Major depressive disorder, recurrent episode, moderate  Suicidal/Homicidal: No -without intent/plan  Therapist Response: [name] met with clinician for an individual session. [name] discussed her psychiatric symptoms, her current life events and her homework. [name] shared that she was having a difficult week. Clinician asked open ended questions and [name] shared about her homework and the negative thoughts and emotions she has been experiencing. Client and clinician discussed her diagnosis. Client and clinician reviewed and discussed ways to challenge and change unhelpful thoughts. Clinician asked open ended questions and [name] identified some of her negative thoughts. Clinician asked open ended questions and [name] identified the consequences of believing the thoughts. She then identified the evidence for and against the thoughts. She was then able to formulate healthier alternative thoughts. Client and clinician reviewed and discussed her homework packet.  Clinician informed [name] that clinician will be leaving [facility]. Clinician asked open ended questions and [name] shared her thoughts and emotions. Client and clinician discussed [name] options for continuing therapy with another clinician.   Plan: Return in 1-2 weeks.  Diagnosis: Axis I: Major depressive disorder, recurrent episode, moderate    Henri Guedes A, LCSW 07/19/2016

## 2016-07-20 MED FILL — ZONISAMIDE 100 MG CAPSULE: 100 | 90 days supply | Qty: 270 | Fill #0

## 2016-07-20 MED FILL — ZOMIG 5 MG NASAL SPRAY: 5 | 30 days supply | Qty: 6 | Fill #2

## 2016-07-20 MED FILL — SUMATRIPTAN SUCC 100 MG TAB: 100 | 15 days supply | Qty: 9 | Fill #1

## 2016-07-25 ENCOUNTER — Encounter (HOSPITAL_COMMUNITY): Payer: Self-pay | Admitting: Clinical

## 2016-07-25 ENCOUNTER — Ambulatory Visit (INDEPENDENT_AMBULATORY_CARE_PROVIDER_SITE_OTHER): Payer: 59 | Admitting: Clinical

## 2016-07-25 DIAGNOSIS — F331 Major depressive disorder, recurrent, moderate: Secondary | ICD-10-CM

## 2016-07-25 NOTE — Progress Notes (Signed)
   THERAPIST PROGRESS NOTE  Session Time: 8:04 - 9:00  Participation Level: Active  Behavioral Response: CasualAlertDepressed  Type of Therapy: Individual Therapy  Treatment Goals addressed: reduce psychiatric symptoms, elevate mood, improve unhelpful thought patterns, increase self esteem,  healthy coping skills   Interventions: CBT and Motivational Interviewing,  Summary: [name] is a 41 y.o. female who presents with Major depressive disorder, recurrent episode, moderate  Suicidal/Homicidal: No -without intent/plan  Therapist Response: [name] met with clinician for an individual session. [name] discussed her psychiatric symptoms, her current life events and her homework. [name] shared that she had some good days and some bad days. Clinician asked open ended questions and [name] shared that she has been having some better days. She shared that she has been meditating and working to challenge unhelpful thought patterns. She shared she was having negative thoughts about her body image. Client and clinician discussed her negative thoughts. Clinician asked open ended questions and [name] identified the negative thoughts and emotions. [name] had the insight that she had not been able to address these thoughts because they cause such high emotions. Clinician asked open ended questions and [name] identified the evidence for and against the thoughts. She was then able to formulate healthier alternative thoughts. She agreed to continue practicing her grounding and mindfulness techniques  Plan: Return in 1-2 weeks.  Diagnosis: Axis I: Major depressive disorder, recurrent episode, moderate    Yahir Tavano A, LCSW 07/25/2016

## 2016-08-02 ENCOUNTER — Ambulatory Visit (INDEPENDENT_AMBULATORY_CARE_PROVIDER_SITE_OTHER): Payer: 59 | Admitting: Clinical

## 2016-08-02 ENCOUNTER — Encounter (HOSPITAL_COMMUNITY): Payer: Self-pay | Admitting: Clinical

## 2016-08-02 DIAGNOSIS — F331 Major depressive disorder, recurrent, moderate: Secondary | ICD-10-CM | POA: Diagnosis not present

## 2016-08-02 NOTE — Progress Notes (Signed)
   THERAPIST PROGRESS NOTE  Session Time: 1:30 -2:30   Participation Level: Active  Behavioral Response: CasualAlertDepressed   Type of Therapy: Individual Therapy  Treatment Goals addressed: reduce psychiatric symptoms, elevate mood, improve unhelpful thought patterns, increase self esteem, learn about diagnosis, healthy coping skills   Interventions:  Motivational Interviewing,  mindfulness techniques, psychoeducation  Summary: [name] is a 41 y.o. female who presents with Major depressive disorder, recurrent episode, moderate  Suicidal/Homicidal: No -without intent/plan  Therapist Response: [name] met with clinician for an individual session. [name] discussed her psychiatric symptoms and her current life events. [name] shared that she has been doing better but is depressed ( crying) today because she is upset about clinician leaving. Clinician asked open ended questions and [name] shared her thoughts and emotions. Clinician validated her emotions. Clinician asked open ended questions and [name] challenged some of her unhelpful thoughts. [name] and clinician discussed what was going well this past week. She shared that she had been praticing being mindful in the moment. She shared that she [place] outing with friends and challenged some of her negative body image beliefs. She shared that she was mostly successful and had a really good day. [name] and clinician discussed the process. [name] and clinician discussed how she could apply the practice to other parts of her life. [name] asked some questions about Depression and her progress. [name] and clinician discussed her symptoms and ways to continue to improve them. [name] and clinician reviewed and discussed the skills learned in therapy and how to apply them and improve on practice.  This is [name] and clinician's last session together. [name] and clinician bid farewell to each other.   Plan: Return in  1-2 weeks.  Diagnosis: Axis I: Major depressive disorder, recurrent episode, moderate    [name] A, LCSW 08/02/2016

## 2016-08-04 DIAGNOSIS — G43019 Migraine without aura, intractable, without status migrainosus: Secondary | ICD-10-CM | POA: Diagnosis not present

## 2016-08-04 DIAGNOSIS — G43839 Menstrual migraine, intractable, without status migrainosus: Secondary | ICD-10-CM | POA: Diagnosis not present

## 2016-08-04 DIAGNOSIS — M791 Myalgia: Secondary | ICD-10-CM | POA: Diagnosis not present

## 2016-08-04 DIAGNOSIS — G43719 Chronic migraine without aura, intractable, without status migrainosus: Secondary | ICD-10-CM | POA: Diagnosis not present

## 2016-08-04 DIAGNOSIS — G43109 Migraine with aura, not intractable, without status migrainosus: Secondary | ICD-10-CM | POA: Diagnosis not present

## 2016-08-04 DIAGNOSIS — R51 Headache: Secondary | ICD-10-CM | POA: Diagnosis not present

## 2016-08-04 DIAGNOSIS — M542 Cervicalgia: Secondary | ICD-10-CM | POA: Diagnosis not present

## 2016-08-15 ENCOUNTER — Other Ambulatory Visit: Payer: Self-pay | Admitting: Gynecology

## 2016-08-15 DIAGNOSIS — Z1231 Encounter for screening mammogram for malignant neoplasm of breast: Secondary | ICD-10-CM

## 2016-08-23 ENCOUNTER — Ambulatory Visit: Payer: Self-pay

## 2016-08-24 ENCOUNTER — Ambulatory Visit (HOSPITAL_COMMUNITY): Payer: Self-pay | Admitting: Clinical

## 2016-08-25 ENCOUNTER — Encounter (HOSPITAL_COMMUNITY): Payer: Self-pay | Admitting: Clinical

## 2016-08-25 ENCOUNTER — Ambulatory Visit (INDEPENDENT_AMBULATORY_CARE_PROVIDER_SITE_OTHER): Payer: 59 | Admitting: Clinical

## 2016-08-25 DIAGNOSIS — F331 Major depressive disorder, recurrent, moderate: Secondary | ICD-10-CM

## 2016-08-25 MED FILL — DULoxetine HCL 60 MG CPEP: 60 | 90 days supply | Qty: 90 | Fill #1

## 2016-08-25 MED FILL — DULoxetine HCL 30 MG CPEP: 30 | 90 days supply | Qty: 90 | Fill #1

## 2016-08-25 MED FILL — SUMATRIPTAN SUCC 100 MG TAB: 100 | 30 days supply | Qty: 9 | Fill #2

## 2016-08-25 NOTE — Progress Notes (Signed)
   THERAPIST PROGRESS NOTE  Session Time: 2:35 -3:30  Participation Level: Active  Behavioral Response: CasualAlertDepressed  Type of Therapy: Individual Therapy  Treatment Goals addressed: reduce psychiatric symptoms, elevate mood, improve unhelpful thought patterns,healthy coping skills   Interventions:  Motivational Interviewing,   Summary: [name] is a 41 y.o. female who presents with Major depressive disorder, recurrent episode, moderate  Suicidal/Homicidal: No -without intent/plan  Therapist Response: [name] met with clinician for an individual session. [name] discussed her psychiatric symptoms, her current life events. [name] shared that she had been feeling depressed lately. She shared that she had noticed that she had decreased her skills practice. Client and clinician discussed her resistance and her outcome. Clinician asked open ended questions and [name] was able to identify ways she could increase the likelihood of her practicing her skills. Clinician asked open ended questions and [name] shared that she recognized that she was being hard on her self. Client and clinician discussed how she could be kind to her self and still be motivated. Clinician asked open ended questions and [name] identified times that she has done so in the past. She was then able to identify how to use the same strategies in the future.   Plan: Return in 1-2 weeks.  Diagnosis: Axis I: Major depressive disorder, recurrent episode, moderate    Quron Ruddy A, LCSW 08/25/2016

## 2016-08-29 ENCOUNTER — Ambulatory Visit
Admission: RE | Admit: 2016-08-29 | Discharge: 2016-08-29 | Disposition: A | Discharge status: Home / Self Care | Payer: 59 | Source: Ambulatory Visit | Attending: Gynecology | Admitting: Gynecology

## 2016-08-29 DIAGNOSIS — Z1231 Encounter for screening mammogram for malignant neoplasm of breast: Secondary | ICD-10-CM

## 2016-09-13 ENCOUNTER — Ambulatory Visit (HOSPITAL_COMMUNITY): Payer: Self-pay | Admitting: Psychiatry

## 2016-09-14 ENCOUNTER — Encounter (HOSPITAL_COMMUNITY): Payer: Self-pay | Admitting: Psychiatry

## 2016-09-14 ENCOUNTER — Ambulatory Visit (INDEPENDENT_AMBULATORY_CARE_PROVIDER_SITE_OTHER): Payer: 59 | Admitting: Psychiatry

## 2016-09-14 ENCOUNTER — Other Ambulatory Visit: Payer: Self-pay | Admitting: *Deleted

## 2016-09-14 DIAGNOSIS — F330 Major depressive disorder, recurrent, mild: Secondary | ICD-10-CM

## 2016-09-14 DIAGNOSIS — Z818 Family history of other mental and behavioral disorders: Secondary | ICD-10-CM

## 2016-09-14 DIAGNOSIS — Z810 Family history of intellectual disabilities: Secondary | ICD-10-CM | POA: Diagnosis not present

## 2016-09-14 MED ORDER — DULOXETINE HCL 60 MG PO CPEP: 60.0000 mg | ORAL_CAPSULE | Freq: Every day | ORAL | 1 refills | Status: DC

## 2016-09-14 MED ORDER — DULOXETINE HCL 30 MG PO CPEP: 30.0000 mg | ORAL_CAPSULE | Freq: Every day | ORAL | 1 refills | Status: DC

## 2016-09-14 MED ORDER — TRAZODONE HCL 50 MG PO TABS: 50.0000 mg | ORAL_TABLET | Freq: Every day | ORAL | 1 refills | Status: DC

## 2016-09-14 MED ORDER — NORETHINDRONE-ETH ESTRADIOL 1-35 MG-MCG PO TABS: ORAL_TABLET | ORAL | 0 refills | Status: DC

## 2016-09-14 MED FILL — DASETTA 1-35-28 TABLET: 1-35 | 84 days supply | Qty: 112 | Fill #0

## 2016-09-14 MED FILL — traZODone HCL 50 MG TABS: 50 | 90 days supply | Qty: 90 | Fill #0

## 2016-09-14 MED FILL — CLOBETASOL 0.05% CREAM: 0.05 | 30 days supply | Qty: 30 | Fill #1

## 2016-09-14 NOTE — Telephone Encounter (Signed)
Annual scheduled on 09/30/16

## 2016-09-14 NOTE — Progress Notes (Signed)
BH MD/PA/NP OP Progress Note  09/14/2016 2:19 PM [name]  MRN:  [id number]  Chief Complaint:  I am doing good.  I'm taking medication.  HPI: [name] came for her follow-up appointment.  She is taking her medication as prescribed.  She denies any irritability, anger, mania or any psychosis.  Her summer was busy.  She was traveling to [place] and then she also went to [place] for hiking.  She was seen in the emergency room in June because of sore throat and given antibiotic but now she is feeling better.  She still have some time headaches but now she is taking Botox hoping to cut down zonisamide in the future.  She denies any irritability, crying spells or any feeling of hopelessness or worthlessness.  She started a new therapist since Maxwell left the office.  Patient denies any tremors shakes or any EPS.  Her energy level is good.  Her vital signs are stable.  She wants to continue trazodone and Cymbalta.  She sleeping good with trazodone.  Visit Diagnosis:    ICD-10-CM   1. Major depressive disorder, recurrent episode, mild (HCC) F33.0 DULoxetine (CYMBALTA) 30 MG capsule    traZODone (DESYREL) 50 MG tablet    Past Psychiatric History: Reviewed. Patient has history of depression since her teens.  In the past she had tried Paxil, Lunesta for insomnia.  She denies any history of suicidal attempt, inpatient psychiatric treatment, psychosis or any hallucination.  Past Medical History:  Past Medical History:  Diagnosis Date  . ASCUS (atypical squamous cells of undetermined significance) on Pap smear   . Depression   . Kidney stones   . Migraines     Past Surgical History:  Procedure Laterality Date  . ANKLE SURGERY  1996   RECONSTRUCTION   . APPENDECTOMY    . BREAST SURGERY  2004   REDUCTION BY DR. Stephanie Coup  . CHOLECYSTECTOMY  2003  . REDUCTION MAMMAPLASTY Bilateral     Family Psychiatric History: Reviewed.  Family History:  Family History  Problem Relation Age of Onset   . Hypertension Father   . Depression Father   . Depression Other   . Hyperlipidemia Mother   . Cancer Maternal Grandmother        COLON  . Depression Maternal Grandmother   . Alzheimer's disease Maternal Grandmother   . Hyperlipidemia Maternal Grandmother   . Cancer Maternal Grandfather        COLON  . Depression Maternal Aunt   . Depression Paternal Grandmother   . Hypertension Paternal Grandfather     Social History:  Social History   Social History  . Marital status: Married    Spouse name: N/A  . Number of children: N/A  . Years of education: N/A   Social History Main Topics  . Smoking status: Never Smoker  . Smokeless tobacco: Never Used  . Alcohol use 0.0 oz/week     Comment: Rare  . Drug use: No  . Sexual activity: Yes    Birth control/ protection: Pill     Comment: 1st intercourse 40 yo-5 partners   Other Topics Concern  . Not on file   Social History Narrative  . No narrative on file    Allergies:  Allergies  Allergen Reactions  . Compazine   . Other     LATEX ALLERGY    Metabolic Disorder Labs: Lab Results  Component Value Date   HGBA1C 5.1 11/03/2011   MPG 100 11/03/2011   No results found  for: PROLACTIN Lab Results  Component Value Date   CHOL 230 (H) 10/17/2012   TRIG 139 10/17/2012   HDL 75 10/17/2012   CHOLHDL 3.1 10/17/2012   VLDL 28 10/17/2012   LDLCALC 127 (H) 10/17/2012   LDLCALC 127 (H) 09/21/2012   No results found for: TSH  Therapeutic Level Labs: No results found for: LITHIUM No results found for: VALPROATE No components found for:  CBMZ  Current Medications: Current Outpatient Prescriptions  Medication Sig Dispense Refill  . amoxicillin-clavulanate (AUGMENTIN) 875-125 MG tablet Take 1 tablet by mouth every 12 (twelve) hours. 20 tablet 0  . benzonatate (TESSALON) 200 MG capsule Take 1 capsule (200 mg total) by mouth 3 (three) times daily as needed for cough. 30 capsule 1  . clobetasol cream (TEMOVATE) 0.05 % Apply  to affected area twice daily as needed 30 g 1  . DULoxetine (CYMBALTA) 30 MG capsule Take 1 capsule (30 mg total) by mouth daily. Take 3 daily 90 capsule 1  . DULoxetine (CYMBALTA) 60 MG capsule Take 1 capsule (60 mg total) by mouth daily. 90 capsule 1  . fluconazole (DIFLUCAN) 200 MG tablet Take 1 tablet (200 mg total) by mouth daily. 5 tablet 0  . Magnesium 250 MG TABS Take 250 mg by mouth 3 (three) times daily.      . norethindrone-ethinyl estradiol 1/35 (ORTHO-NOVUM, [place]) tablet Patient takes active pills only skipping placebo pills 4 Package 0  . Omega-3 Fatty Acids (OMEGA 3 PO) Take by mouth.      . perphenazine (TRILAFON) 4 MG tablet   0  . predniSONE (DELTASONE) 50 MG tablet Take 1 tablet (50 mg total) by mouth daily. 3 tablet 0  . SUMAtriptan (IMITREX) 100 MG tablet Take 100 mg by mouth as needed.      . traZODone (DESYREL) 50 MG tablet Take 1 tablet (50 mg total) by mouth at bedtime. 90 tablet 1  . ZOMIG 5 MG nasal solution   3  . zonisamide (ZONEGRAN) 100 MG capsule Take 300 mg by mouth daily.      No current facility-administered medications for this visit.      Musculoskeletal: Strength & Muscle Tone: within normal limits Gait & Station: normal Patient leans: N/A  Psychiatric Specialty Exam: ROS  Blood pressure 122/74, pulse 61, height 5\' 8"  (1.727 m), weight 229 lb 9.6 oz (104.1 kg).There is no height or weight on file to calculate BMI.  General Appearance: Casual  Eye Contact:  Good  Speech:  Clear and Coherent  Volume:  Normal  Mood:  Euthymic  Affect:  Appropriate  Thought Process:  Goal Directed  Orientation:  Full (Time, Place, and Person)  Thought Content: Logical   Suicidal Thoughts:  No  Homicidal Thoughts:  No  Memory:  Immediate;   Good Recent;   Good Remote;   Good  Judgement:  Good  Insight:  Good  Psychomotor Activity:  Normal  Concentration:  Concentration: Good and Attention Span: Good  Recall:  Good  Fund of Knowledge: Good   Language: Good  Akathisia:  No  Handed:  Right  AIMS (if indicated): not done  Assets:  Communication Skills Desire for Improvement Housing Resilience Social Support Talents/Skills  ADL's:  Intact  Cognition: WNL  Sleep:  Good   Screenings: PHQ2-9     Office Visit from 06/21/2016 in [place]  PHQ-2 Total Score  2  PHQ-9 Total Score  8      Assessment: Major depressive disorder, recurrent.  Plan: Patient doing much better on her current psychiatric medication.  She has no side effects or any concerns.  I will continue Cymbalta 90 mg daily and trazodone 50 mg at bedtime.  Recommended to call us back if she has any question or any concern.  Follow-up in 6 months. Jolane Bankhead T., MD 09/14/2016, 2:19 PM

## 2016-09-15 MED FILL — BOTOX 100 UNITS VIAL: 100 | 30 days supply | Qty: 2 | Fill #1

## 2016-09-22 DIAGNOSIS — M791 Myalgia: Secondary | ICD-10-CM | POA: Diagnosis not present

## 2016-09-22 DIAGNOSIS — G43839 Menstrual migraine, intractable, without status migrainosus: Secondary | ICD-10-CM | POA: Diagnosis not present

## 2016-09-22 DIAGNOSIS — G43109 Migraine with aura, not intractable, without status migrainosus: Secondary | ICD-10-CM | POA: Diagnosis not present

## 2016-09-22 DIAGNOSIS — G43019 Migraine without aura, intractable, without status migrainosus: Secondary | ICD-10-CM | POA: Diagnosis not present

## 2016-09-22 DIAGNOSIS — G43719 Chronic migraine without aura, intractable, without status migrainosus: Secondary | ICD-10-CM | POA: Diagnosis not present

## 2016-09-22 DIAGNOSIS — M542 Cervicalgia: Secondary | ICD-10-CM | POA: Diagnosis not present

## 2016-09-23 MED FILL — PERPHENAZINE 4 MG TABLET: 4 | 5 days supply | Qty: 30 | Fill #0

## 2016-09-23 MED FILL — SUMATRIPTAN SUCC 100 MG TAB: 100 | 30 days supply | Qty: 18 | Fill #0

## 2016-09-23 MED FILL — ZOMIG 5 MG NASAL SPRAY: 5 | 30 days supply | Qty: 6 | Fill #0

## 2016-09-30 ENCOUNTER — Encounter: Payer: Self-pay | Admitting: Gynecology

## 2016-09-30 ENCOUNTER — Ambulatory Visit (INDEPENDENT_AMBULATORY_CARE_PROVIDER_SITE_OTHER): Payer: 59 | Admitting: Gynecology

## 2016-09-30 VITALS — BP 118/76 | Ht 68.5 in | Wt 228.0 lb

## 2016-09-30 DIAGNOSIS — Z01419 Encounter for gynecological examination (general) (routine) without abnormal findings: Secondary | ICD-10-CM | POA: Diagnosis not present

## 2016-09-30 MED ORDER — NORETHINDRONE-ETH ESTRADIOL 1-35 MG-MCG PO TABS: ORAL_TABLET | ORAL | 3 refills | Status: DC

## 2016-09-30 NOTE — Progress Notes (Signed)
    [name] Nov 10, 1975 [id number]        41 y.o.  G0P0 for annual gynecologic exam.  Doing well without complaints  Past medical history,surgical history, problem list, medications, allergies, family history and social history were all reviewed and documented as reviewed in the EPIC chart.  ROS:  Performed with pertinent positives and negatives included in the history, assessment and plan.   Additional significant findings :  None   Exam: [name] assistant Vitals:   09/30/16 1357  BP: 118/76  Weight: 228 lb (103.4 kg)  Height: 5' 8.5" (1.74 m)   Body mass index is 34.16 kg/m.  General appearance:  Normal affect, orientation and appearance. Skin: Grossly normal HEENT: Without gross lesions.  No cervical or supraclavicular adenopathy. Thyroid normal.  Lungs:  Clear without wheezing, rales or rhonchi Cardiac: RR, without RMG Abdominal:  Soft, nontender, without masses, guarding, rebound, organomegaly or hernia Breasts:  Examined lying and sitting without masses, retractions, discharge or axillary adenopathy.  Well-healed bilateral reduction scars Pelvic:  Ext, BUS, Vagina: Normal  Cervix: Normal  Uterus: Anteverted, normal size, shape and contour, midline and mobile nontender   Adnexa: Without masses or tenderness    Anus and perineum: Normal   Rectovaginal: Normal sphincter tone without palpated masses or tenderness.    Assessment/Plan:  41 y.o. G0P0 female for annual gynecologic exam without menses, continuous oral contraceptives.   1. Continuous oral contraceptives. Doing well and wants to continue. Risks to include stroke heart attack DVT again reviewed. Refill 1 year provided. 2. Mammography 08/2016. Continue with annual mammography next year. Breast exam normal today. 3. Pap smear/HPV 2016 negative. No Pap smear done today. History of several ASCUS Pap smears with negative HPV's note past but otherwise normal Pap smears. Plan repeat Pap smear at 5 year  interval per current screening guidelines. 4. History of positive IgG G HSV-1 and HSV-2 antibodies. Negative IgM antibodies. Discussed the indications of this previously in a patient who never had a clinical outbreak. She again reports no outbreaks. We have discussed the issue of daily suppressive dosing to help suppress partner transmission in the past and she has declined. 5. Uses Temovate 0.05% cream intermittently for vulvar itching. Has supply but will call if she needs more. 6. Health maintenance. No routine lab work done as patient does this through her primary physician's office. Follow up 1 year, sooner as needed.   Anastasio Auerbach MD, 2:19 PM 09/30/2016

## 2016-09-30 NOTE — Patient Instructions (Signed)
Followup in one year for annual exam, sooner as needed. 

## 2016-10-19 MED FILL — ZONISAMIDE 100 MG CAPSULE: 100 | 90 days supply | Qty: 270 | Fill #0

## 2016-11-04 DIAGNOSIS — G43839 Menstrual migraine, intractable, without status migrainosus: Secondary | ICD-10-CM | POA: Diagnosis not present

## 2016-11-04 DIAGNOSIS — R51 Headache: Secondary | ICD-10-CM | POA: Diagnosis not present

## 2016-11-04 DIAGNOSIS — G43109 Migraine with aura, not intractable, without status migrainosus: Secondary | ICD-10-CM | POA: Diagnosis not present

## 2016-11-04 DIAGNOSIS — G43719 Chronic migraine without aura, intractable, without status migrainosus: Secondary | ICD-10-CM | POA: Diagnosis not present

## 2016-11-04 DIAGNOSIS — G43019 Migraine without aura, intractable, without status migrainosus: Secondary | ICD-10-CM | POA: Diagnosis not present

## 2016-11-04 DIAGNOSIS — M542 Cervicalgia: Secondary | ICD-10-CM | POA: Diagnosis not present

## 2016-11-04 DIAGNOSIS — M791 Myalgia, unspecified site: Secondary | ICD-10-CM | POA: Diagnosis not present

## 2016-11-22 MED FILL — SUMATRIPTAN SUCC 100 MG TAB: 100 | 14 days supply | Qty: 9 | Fill #1

## 2016-11-22 MED FILL — DULoxetine HCL 60 MG CPEP: 60 | 90 days supply | Qty: 90 | Fill #0

## 2016-11-22 MED FILL — DULoxetine HCL 30 MG CPEP: 30 | 90 days supply | Qty: 90 | Fill #0

## 2016-12-07 MED FILL — ZOMIG 5 MG NASAL SPRAY: 5 | 30 days supply | Qty: 6 | Fill #1

## 2016-12-07 MED FILL — DASETTA 1-35-28 TABLET: 1-35 | 84 days supply | Qty: 112 | Fill #0

## 2016-12-12 MED FILL — BOTOX 100 UNITS VIAL: 100 | 30 days supply | Qty: 2 | Fill #2

## 2016-12-14 DIAGNOSIS — D485 Neoplasm of uncertain behavior of skin: Secondary | ICD-10-CM | POA: Diagnosis not present

## 2016-12-14 DIAGNOSIS — D2262 Melanocytic nevi of left upper limb, including shoulder: Secondary | ICD-10-CM | POA: Diagnosis not present

## 2016-12-14 DIAGNOSIS — Z0000 Encounter for general adult medical examination without abnormal findings: Secondary | ICD-10-CM | POA: Diagnosis not present

## 2016-12-14 DIAGNOSIS — D225 Melanocytic nevi of trunk: Secondary | ICD-10-CM | POA: Diagnosis not present

## 2016-12-14 DIAGNOSIS — D2239 Melanocytic nevi of other parts of face: Secondary | ICD-10-CM | POA: Diagnosis not present

## 2016-12-14 DIAGNOSIS — D2261 Melanocytic nevi of right upper limb, including shoulder: Secondary | ICD-10-CM | POA: Diagnosis not present

## 2016-12-14 DIAGNOSIS — L718 Other rosacea: Secondary | ICD-10-CM | POA: Diagnosis not present

## 2016-12-14 DIAGNOSIS — D2271 Melanocytic nevi of right lower limb, including hip: Secondary | ICD-10-CM | POA: Diagnosis not present

## 2016-12-14 DIAGNOSIS — L814 Other melanin hyperpigmentation: Secondary | ICD-10-CM | POA: Diagnosis not present

## 2016-12-14 DIAGNOSIS — D2272 Melanocytic nevi of left lower limb, including hip: Secondary | ICD-10-CM | POA: Diagnosis not present

## 2016-12-14 DIAGNOSIS — L438 Other lichen planus: Secondary | ICD-10-CM | POA: Diagnosis not present

## 2016-12-14 DIAGNOSIS — E7849 Other hyperlipidemia: Secondary | ICD-10-CM | POA: Diagnosis not present

## 2016-12-19 MED FILL — traZODone HCL 50 MG TABS: 50 | 90 days supply | Qty: 90 | Fill #1

## 2016-12-21 DIAGNOSIS — F325 Major depressive disorder, single episode, in full remission: Secondary | ICD-10-CM | POA: Diagnosis not present

## 2016-12-21 DIAGNOSIS — Z0000 Encounter for general adult medical examination without abnormal findings: Secondary | ICD-10-CM | POA: Diagnosis not present

## 2016-12-21 DIAGNOSIS — L988 Other specified disorders of the skin and subcutaneous tissue: Secondary | ICD-10-CM | POA: Diagnosis not present

## 2016-12-21 DIAGNOSIS — E7849 Other hyperlipidemia: Secondary | ICD-10-CM | POA: Diagnosis not present

## 2016-12-21 DIAGNOSIS — Z6834 Body mass index (BMI) 34.0-34.9, adult: Secondary | ICD-10-CM | POA: Diagnosis not present

## 2016-12-21 DIAGNOSIS — G43909 Migraine, unspecified, not intractable, without status migrainosus: Secondary | ICD-10-CM | POA: Diagnosis not present

## 2016-12-21 DIAGNOSIS — Z1389 Encounter for screening for other disorder: Secondary | ICD-10-CM | POA: Diagnosis not present

## 2016-12-21 DIAGNOSIS — Z23 Encounter for immunization: Secondary | ICD-10-CM | POA: Diagnosis not present

## 2016-12-22 DIAGNOSIS — M542 Cervicalgia: Secondary | ICD-10-CM | POA: Diagnosis not present

## 2016-12-22 DIAGNOSIS — G43019 Migraine without aura, intractable, without status migrainosus: Secondary | ICD-10-CM | POA: Diagnosis not present

## 2016-12-22 DIAGNOSIS — G43109 Migraine with aura, not intractable, without status migrainosus: Secondary | ICD-10-CM | POA: Diagnosis not present

## 2016-12-22 DIAGNOSIS — G43839 Menstrual migraine, intractable, without status migrainosus: Secondary | ICD-10-CM | POA: Diagnosis not present

## 2016-12-22 DIAGNOSIS — M791 Myalgia, unspecified site: Secondary | ICD-10-CM | POA: Diagnosis not present

## 2016-12-22 DIAGNOSIS — G43719 Chronic migraine without aura, intractable, without status migrainosus: Secondary | ICD-10-CM | POA: Diagnosis not present

## 2016-12-22 MED FILL — SUMATRIPTAN SUCC 100 MG TAB: 100 | 30 days supply | Qty: 9 | Fill #0

## 2016-12-30 ENCOUNTER — Other Ambulatory Visit: Payer: Self-pay | Admitting: General Surgery

## 2016-12-30 DIAGNOSIS — D229 Melanocytic nevi, unspecified: Secondary | ICD-10-CM | POA: Diagnosis not present

## 2016-12-30 DIAGNOSIS — L089 Local infection of the skin and subcutaneous tissue, unspecified: Secondary | ICD-10-CM | POA: Diagnosis not present

## 2017-01-05 DIAGNOSIS — H5213 Myopia, bilateral: Secondary | ICD-10-CM | POA: Diagnosis not present

## 2017-01-12 MED FILL — ZOMIG 5 MG NASAL SPRAY: 5 | 30 days supply | Qty: 6 | Fill #2

## 2017-01-19 MED FILL — SUMATRIPTAN SUCC 100 MG TAB: 100 | 30 days supply | Qty: 9 | Fill #1

## 2017-01-19 MED FILL — ZONISAMIDE 100 MG CAPSULE: 100 | 90 days supply | Qty: 270 | Fill #0

## 2017-01-31 ENCOUNTER — Ambulatory Visit (INDEPENDENT_AMBULATORY_CARE_PROVIDER_SITE_OTHER): Payer: 59 | Admitting: Psychiatry

## 2017-01-31 ENCOUNTER — Encounter (HOSPITAL_COMMUNITY): Payer: Self-pay | Admitting: Psychiatry

## 2017-01-31 DIAGNOSIS — G4700 Insomnia, unspecified: Secondary | ICD-10-CM | POA: Diagnosis not present

## 2017-01-31 DIAGNOSIS — Z818 Family history of other mental and behavioral disorders: Secondary | ICD-10-CM | POA: Diagnosis not present

## 2017-01-31 DIAGNOSIS — R45851 Suicidal ideations: Secondary | ICD-10-CM

## 2017-01-31 DIAGNOSIS — Z810 Family history of intellectual disabilities: Secondary | ICD-10-CM

## 2017-01-31 DIAGNOSIS — F330 Major depressive disorder, recurrent, mild: Secondary | ICD-10-CM | POA: Diagnosis not present

## 2017-01-31 MED ORDER — TRAZODONE HCL 100 MG PO TABS
100.0000 mg | ORAL_TABLET | Freq: Every day | ORAL | 0 refills | Status: DC
Start: 2017-01-31 — End: 2017-05-23

## 2017-01-31 MED ORDER — DULOXETINE HCL 30 MG PO CPEP: 30.0000 mg | ORAL_CAPSULE | Freq: Every day | ORAL | 0 refills | Status: DC

## 2017-01-31 MED ORDER — ESCITALOPRAM OXALATE 10 MG PO TABS: ORAL_TABLET | ORAL | 0 refills | Status: DC

## 2017-01-31 MED FILL — ESCITALOPRAM 10 MG TABLET: 10 | 30 days supply | Qty: 60 | Fill #0

## 2017-01-31 MED FILL — traZODone HCL 100 MG TABS: 100 | 30 days supply | Qty: 30 | Fill #0

## 2017-01-31 NOTE — Progress Notes (Signed)
BH MD/PA/NP OP Progress Note  01/31/2017 4:36 PM [name]  MRN:  [id number]  Chief Complaint: I am not doing good.  I am very depressed and having no energy and motivation to do things.  I am having crying spells.  HPI: [name] came for her follow-up appointment.  For the past few weeks she is experiencing increased crying spells, feeling depressed, sad, lack of motivation to do things.  She admitted poor energy and sometimes feeling hopeless and helpless.  She is not sure what triggered the symptoms.  She had a quite Christmas.  She denies any paranoia, hallucination, self abusive behavior.  She admitted passive and fleeting suicidal thoughts but no plan or any intent.  She is seeing Marilynn Rail at [facility] for counseling.  She is tolerating her medication but feels that current medicine not working.  She is taking Cymbalta, trazodone.  She also takes multiple medication for headaches.  [facility]  Patient denies drinking alcohol or using any illegal substances.  Visit Diagnosis:    ICD-10-CM   1. Major depressive disorder, recurrent episode, mild (HCC) F33.0 escitalopram (LEXAPRO) 10 MG tablet    traZODone (DESYREL) 100 MG tablet    DULoxetine (CYMBALTA) 30 MG capsule    Past Psychiatric History:  Patient has history of depression since her teens.  In the past she had tried Paxil, Lunesta and briefly Wellbutrin.  She denies any history of suicidal attempt, psychiatric inpatient treatment, psychosis or any self abusive behavior.  Past Medical History:  Past Medical History:  Diagnosis Date  . ASCUS (atypical squamous cells of undetermined significance) on Pap smear   . Depression   . Kidney stones   . Migraines     Past Surgical History:  Procedure Laterality Date  . ANKLE SURGERY  1996   RECONSTRUCTION   . APPENDECTOMY    . BREAST SURGERY  2004   REDUCTION BY DR. Stephanie Coup   . CHOLECYSTECTOMY  2003  . REDUCTION MAMMAPLASTY Bilateral     Family Psychiatric History: Viewed.  Family History:  Family History  Problem Relation Age of Onset  . Hypertension Father   . Depression Father   . Depression Other   . Hyperlipidemia Mother   . Cancer Maternal Grandmother        COLON  . Depression Maternal Grandmother   . Alzheimer's disease Maternal Grandmother   . Hyperlipidemia Maternal Grandmother   . Cancer Maternal Grandfather        COLON  . Depression Maternal Aunt   . Depression Paternal Grandmother   . Hypertension Paternal Grandfather     Social History:  Social History   Socioeconomic History  . Marital status: Married    Spouse name: Not on file  . Number of children: Not on file  . Years of education: Not on file  . Highest education level: Not on file  Social Needs  . Financial resource strain: Not on file  . Food insecurity - worry: Not on file  . Food insecurity - inability: Not on file  . Transportation needs - medical: Not on file  . Transportation needs - non-medical: Not on file  Occupational History  . Not on file  Tobacco Use  . Smoking status: Never Smoker  . Smokeless tobacco: Never Used  Substance and Sexual Activity  . Alcohol use: Yes    Alcohol/week: 0.0 oz    Comment: Rare  .  Drug use: No  . Sexual activity: Yes    Birth control/protection: Pill    Comment: 1st intercourse 52 yo-5 partners  Other Topics Concern  . Not on file  Social History Narrative  . Not on file    Allergies:  Allergies  Allergen Reactions  . Compazine   . Other     LATEX ALLERGY    Metabolic Disorder Labs: Lab Results  Component Value Date   HGBA1C 5.1 11/03/2011   MPG 100 11/03/2011   No results found for: PROLACTIN Lab Results  Component Value Date   CHOL 230 (H) 10/17/2012   TRIG 139 10/17/2012   HDL 75 10/17/2012   CHOLHDL 3.1 10/17/2012   VLDL 28 10/17/2012   LDLCALC 127 (H) 10/17/2012   LDLCALC 127 (H)  09/21/2012   No results found for: TSH  Therapeutic Level Labs: No results found for: LITHIUM No results found for: VALPROATE No components found for:  CBMZ  Current Medications: Current Outpatient Medications  Medication Sig Dispense Refill  . clobetasol cream (TEMOVATE) 0.05 % Apply to affected area twice daily as needed 30 g 1  . DULoxetine (CYMBALTA) 30 MG capsule Take 1 capsule (30 mg total) by mouth daily. 90 capsule 1  . DULoxetine (CYMBALTA) 60 MG capsule Take 1 capsule (60 mg total) by mouth daily. 90 capsule 1  . fluconazole (DIFLUCAN) 200 MG tablet Take 1 tablet (200 mg total) by mouth daily. (Patient not taking: Reported on 09/30/2016) 5 tablet 0  . Magnesium 250 MG TABS Take 250 mg by mouth 3 (three) times daily.      . norethindrone-ethinyl estradiol 1/35 (ORTHO-NOVUM, [place]) tablet Patient takes active pills only skipping placebo pills 4 Package 3  . Omega-3 Fatty Acids (OMEGA 3 PO) Take by mouth.      . OnabotulinumtoxinA (BOTOX IJ) Inject as directed.    Marland Kitchen perphenazine (TRILAFON) 4 MG tablet   0  . SUMAtriptan (IMITREX) 100 MG tablet Take 100 mg by mouth as needed.      . traZODone (DESYREL) 50 MG tablet Take 1 tablet (50 mg total) by mouth at bedtime. 90 tablet 1  . ZOMIG 5 MG nasal solution   3  . zonisamide (ZONEGRAN) 100 MG capsule Take 300 mg by mouth daily.      No current facility-administered medications for this visit.      Musculoskeletal: Strength & Muscle Tone: within normal limits Gait & Station: normal Patient leans: N/A  Psychiatric Specialty Exam: Review of Systems  Constitutional: Positive for malaise/fatigue.  HENT: Negative.   Skin: Negative for itching and rash.  Neurological: Positive for headaches.  Psychiatric/Behavioral: Positive for depression. The patient has insomnia.     Blood pressure 126/74, pulse 88, height 5\' 8"  (1.727 m), weight 227 lb 9.6 oz (103.2 kg).There is no height or weight on file to calculate BMI.   General Appearance: Casual  Eye Contact:  Fair  Speech:  Slow  Volume:  Decreased  Mood:  Depressed, Dysphoric and Hopeless  Affect:  Constricted and Depressed  Thought Process:  Goal Directed  Orientation:  Full (Time, Place, and Person)  Thought Content: Rumination   Suicidal Thoughts:  Yes.  without intent/plan  Homicidal Thoughts:  No  Memory:  Immediate;   Good Recent;   Good Remote;   Good  Judgement:  Good  Insight:  Good  Psychomotor Activity:  Decreased  Concentration:  Concentration: Good and Attention Span: Good  Recall:  Good  Fund of Knowledge: Good  Language: Good  Akathisia:  No  Handed:  Right  AIMS (if indicated): not done  Assets:  Communication Skills Desire for Improvement Housing Resilience Social Support Talents/Skills  ADL's:  Intact  Cognition: WNL  Sleep:  Fair   Screenings: PHQ2-9     Office Visit from 06/21/2016 in [place]  PHQ-2 Total Score  2  PHQ-9 Total Score  8       Assessment and Plan: Major depressive disorder, recurrent.  Discussed in length about her psychosocial stressors and current current symptoms.  She is experiencing increased depression with passive and fleeting suicidal thoughts but no plan.  Recommended to try Lexapro 10 mg daily for 1 week and then 20 mg daily.  Reduce Cymbalta from 90 mg to 60 mg for 1 week and then 30 mg after 2 weeks.  Encouraged to continue counseling with her therapist [name].  We will get her consent to get collect information from her therapist.  Encouraged to continue gym which is helping her.  Discussed safety concerns that any time having active suicidal thoughts or any plan then she should call us immediately.  Discussed medication side effect especially GI side effects from Lexapro.  Follow-up in 4-6 weeks.  Encouraged to keep appointment to see her headache doctors for the management of headaches.   Kathlee Nations, MD 01/31/2017, 4:36 PM

## 2017-02-03 DIAGNOSIS — G43719 Chronic migraine without aura, intractable, without status migrainosus: Secondary | ICD-10-CM | POA: Diagnosis not present

## 2017-02-03 DIAGNOSIS — M542 Cervicalgia: Secondary | ICD-10-CM | POA: Diagnosis not present

## 2017-02-03 DIAGNOSIS — G43839 Menstrual migraine, intractable, without status migrainosus: Secondary | ICD-10-CM | POA: Diagnosis not present

## 2017-02-03 DIAGNOSIS — G43109 Migraine with aura, not intractable, without status migrainosus: Secondary | ICD-10-CM | POA: Diagnosis not present

## 2017-02-03 DIAGNOSIS — R51 Headache: Secondary | ICD-10-CM | POA: Diagnosis not present

## 2017-02-03 DIAGNOSIS — M791 Myalgia, unspecified site: Secondary | ICD-10-CM | POA: Diagnosis not present

## 2017-02-03 DIAGNOSIS — G43019 Migraine without aura, intractable, without status migrainosus: Secondary | ICD-10-CM | POA: Diagnosis not present

## 2017-02-09 MED FILL — ZOMIG 5 MG NASAL SPRAY: 5 | 30 days supply | Qty: 6 | Fill #0

## 2017-02-16 MED FILL — SUMATRIPTAN SUCC 100 MG TAB: 100 | 30 days supply | Qty: 8 | Fill #2

## 2017-02-23 ENCOUNTER — Ambulatory Visit (HOSPITAL_COMMUNITY): Payer: Self-pay | Admitting: Psychiatry

## 2017-02-28 ENCOUNTER — Ambulatory Visit (HOSPITAL_COMMUNITY): Payer: 59 | Admitting: Psychiatry

## 2017-02-28 ENCOUNTER — Encounter (HOSPITAL_COMMUNITY): Payer: Self-pay | Admitting: Psychiatry

## 2017-02-28 DIAGNOSIS — Z810 Family history of intellectual disabilities: Secondary | ICD-10-CM | POA: Diagnosis not present

## 2017-02-28 DIAGNOSIS — F330 Major depressive disorder, recurrent, mild: Secondary | ICD-10-CM | POA: Diagnosis not present

## 2017-02-28 DIAGNOSIS — G4700 Insomnia, unspecified: Secondary | ICD-10-CM | POA: Diagnosis not present

## 2017-02-28 DIAGNOSIS — Z818 Family history of other mental and behavioral disorders: Secondary | ICD-10-CM | POA: Diagnosis not present

## 2017-02-28 MED ORDER — ESCITALOPRAM OXALATE 20 MG PO TABS: 20.0000 mg | ORAL_TABLET | Freq: Every day | ORAL | 0 refills | Status: DC

## 2017-02-28 MED FILL — ESCITALOPRAM 20 MG TABLET: 20 | 90 days supply | Qty: 90 | Fill #0

## 2017-02-28 NOTE — Progress Notes (Signed)
BH MD/PA/NP OP Progress Note  02/28/2017 4:25 PM [name]  MRN:  [id number]  Chief Complaint: I like new medication.  I am feeling better.  HPI: [name] came for her follow-up appointment.  She is doing better on Lexapro.  In the beginning she has nausea and GI side effects but now she is tolerating very well.  She stopped Cymbalta yesterday.  She is pleased that she has no withdrawal symptoms.  She noticed Lexapro helping her anxiety and depression.  She has increased energy and she denies any feeling of hopelessness, worthlessness or any suicidal thoughts.  She resume her running.  She is also seeing [name] for CBT.  Patient denies any agitation, hallucination, paranoia, self abusive behavior or any irritability.  She is taking trazodone 50 mg at bedtime.  She still have headaches but overall she feels much better.  She is taking multiple medication for headaches.  She is taking Botox which she is hoping to continue in the future because it helps.  Patient denies drinking alcohol or using any illegal substances.  Her appetite is okay.  Her vital signs are stable.  Visit Diagnosis:    ICD-10-CM   1. Major depressive disorder, recurrent episode, mild (HCC) F33.0 escitalopram (LEXAPRO) 20 MG tablet    Past Psychiatric History: Reviewed Patient has history of depression since her teens.  In the past she had tried Paxil, Lunesta and briefly Wellbutrin.    She had a good response with Cymbalta until it stopped working.  She denies any history of suicidal attempt, psychiatric inpatient treatment, psychosis or any self abusive behavior.  Past Medical History:  Past Medical History:  Diagnosis Date  . ASCUS (atypical squamous cells of undetermined significance) on Pap smear   . Depression   . Kidney stones   . Migraines     Past Surgical History:  Procedure Laterality Date  . ANKLE SURGERY  1996   RECONSTRUCTION   . APPENDECTOMY    . BREAST SURGERY  2004   REDUCTION BY DR. Stephanie Coup   . CHOLECYSTECTOMY  2003  . REDUCTION MAMMAPLASTY Bilateral     Family Psychiatric History: Viewed.  Family History:  Family History  Problem Relation Age of Onset  . Hypertension Father   . Depression Father   . Depression Other   . Hyperlipidemia Mother   . Cancer Maternal Grandmother        COLON  . Depression Maternal Grandmother   . Alzheimer's disease Maternal Grandmother   . Hyperlipidemia Maternal Grandmother   . Cancer Maternal Grandfather        COLON  . Depression Maternal Aunt   . Depression Paternal Grandmother   . Hypertension Paternal Grandfather     Social History:  Social History   Socioeconomic History  . Marital status: Married    Spouse name: Not on file  . Number of children: Not on file  . Years of education: Not on file  . Highest education level: Not on file  Social Needs  . Financial resource strain: Not on file  . Food insecurity - worry: Not on file  . Food insecurity - inability: Not on file  . Transportation needs - medical: Not on file  . Transportation needs - non-medical: Not on file  Occupational History  . Not on file  Tobacco Use  . Smoking status: Never Smoker  . Smokeless tobacco: Never Used  Substance and Sexual Activity  . Alcohol use: Yes    Alcohol/week: 0.0 oz  Comment: Rare  . Drug use: No  . Sexual activity: Yes    Birth control/protection: Pill    Comment: 1st intercourse 32 yo-5 partners  Other Topics Concern  . Not on file  Social History Narrative  . Not on file    Allergies:  Allergies  Allergen Reactions  . Compazine   . Other     LATEX ALLERGY    Metabolic Disorder Labs: Lab Results  Component Value Date   HGBA1C 5.1 11/03/2011   MPG 100 11/03/2011   No results found for: PROLACTIN Lab Results  Component Value Date   CHOL 230 (H) 10/17/2012   TRIG 139 10/17/2012   HDL 75 10/17/2012   CHOLHDL 3.1 10/17/2012   VLDL 28 10/17/2012   LDLCALC 127 (H) 10/17/2012   LDLCALC 127 (H)  09/21/2012   No results found for: TSH  Therapeutic Level Labs: No results found for: LITHIUM No results found for: VALPROATE No components found for:  CBMZ  Current Medications: Current Outpatient Medications  Medication Sig Dispense Refill  . BOTOX 100 units SOLR injection INJECT 200 UNITS INTRAMUSCULARLY IN THE HEAD, NECK AND TRUNK AREAS EVERY 12 WEEKS  3  . clobetasol cream (TEMOVATE) 0.05 % Apply to affected area twice daily as needed 30 g 1  . escitalopram (LEXAPRO) 20 MG tablet Take 1 tablet (20 mg total) by mouth daily. 90 tablet 0  . Magnesium 250 MG TABS Take 250 mg by mouth 3 (three) times daily.      . norethindrone-ethinyl estradiol 1/35 (ORTHO-NOVUM, [place]) tablet Patient takes active pills only skipping placebo pills 4 Package 3  . Omega-3 Fatty Acids (OMEGA 3 PO) Take by mouth.      . perphenazine (TRILAFON) 4 MG tablet   0  . SUMAtriptan (IMITREX) 100 MG tablet Take 100 mg by mouth as needed.      . traZODone (DESYREL) 100 MG tablet Take 1 tablet (100 mg total) by mouth at bedtime. 30 tablet 0  . ZOMIG 5 MG nasal solution   3  . zonisamide (ZONEGRAN) 100 MG capsule Take 300 mg by mouth daily.     . fluconazole (DIFLUCAN) 200 MG tablet Take 1 tablet (200 mg total) by mouth daily. (Patient not taking: Reported on 09/30/2016) 5 tablet 0   No current facility-administered medications for this visit.      Musculoskeletal: Strength & Muscle Tone: within normal limits Gait & Station: normal Patient leans: N/A  Psychiatric Specialty Exam: ROS  Blood pressure 126/78, pulse 96, height 5\' 8"  (1.727 m), weight 221 lb (100.2 kg).Body mass index is 33.6 kg/m.  General Appearance: Casual  Eye Contact:  Good  Speech:  Clear and Coherent  Volume:  Normal  Mood:  Euthymic  Affect:  Appropriate  Thought Process:  Goal Directed  Orientation:  Full (Time, Place, and Person)  Thought Content: Logical   Suicidal Thoughts:  No  Homicidal Thoughts:  No  Memory:   Immediate;   Good Recent;   Good Remote;   Good  Judgement:  Good  Insight:  Good  Psychomotor Activity:  Normal  Concentration:  Concentration: Good and Attention Span: Good  Recall:  Good  Fund of Knowledge: Good  Language: Good  Akathisia:  No  Handed:  Right  AIMS (if indicated): not done  Assets:  Communication Skills Desire for [place] Talents/Skills Transportation  ADL's:  Intact  Cognition: WNL  Sleep:  Good   Screenings: PHQ2-9  Office Visit from 06/21/2016 in [facility]  PHQ-2 Total Score  2  PHQ-9 Total Score  8       Assessment and Plan: Major Depressive disorder, recurrent.  Insomnia.  Patient doing better on Lexapro.  She is no longer taking Cymbalta.  Encouraged to see her therapist [name] for CBT.  Continue Lexapro 20 mg daily and trazodone 5200 mg as needed for insomnia.  Discontinue Cymbalta as patient is no longer taking it.  Recommended to call us back if she has any question or any concern.  Follow-up in 3 months.  Encourage healthy lifestyle and watch her calorie intake.   Kathlee Nations, MD 02/28/2017, 4:25 PM

## 2017-03-02 MED FILL — DASETTA 1-35-28 TABLET: 1-35 | 84 days supply | Qty: 112 | Fill #1

## 2017-03-14 ENCOUNTER — Ambulatory Visit (HOSPITAL_COMMUNITY): Payer: Self-pay | Admitting: Psychiatry

## 2017-03-16 MED FILL — BOTOX 100 UNITS VIAL: 100 | 30 days supply | Qty: 2 | Fill #3

## 2017-03-23 DIAGNOSIS — M542 Cervicalgia: Secondary | ICD-10-CM | POA: Diagnosis not present

## 2017-03-23 DIAGNOSIS — G43719 Chronic migraine without aura, intractable, without status migrainosus: Secondary | ICD-10-CM | POA: Diagnosis not present

## 2017-03-23 DIAGNOSIS — G43019 Migraine without aura, intractable, without status migrainosus: Secondary | ICD-10-CM | POA: Diagnosis not present

## 2017-03-23 DIAGNOSIS — M791 Myalgia, unspecified site: Secondary | ICD-10-CM | POA: Diagnosis not present

## 2017-03-23 DIAGNOSIS — G43839 Menstrual migraine, intractable, without status migrainosus: Secondary | ICD-10-CM | POA: Diagnosis not present

## 2017-03-23 DIAGNOSIS — G518 Other disorders of facial nerve: Secondary | ICD-10-CM | POA: Diagnosis not present

## 2017-03-23 DIAGNOSIS — R51 Headache: Secondary | ICD-10-CM | POA: Diagnosis not present

## 2017-03-23 MED FILL — SUMATRIPTAN SUCC 100 MG TAB: 100 | 30 days supply | Qty: 8 | Fill #0

## 2017-04-12 MED FILL — ZOMIG 5 MG NASAL SPRAY: 5 | 30 days supply | Qty: 6 | Fill #1

## 2017-04-18 MED FILL — ZONISAMIDE 100 MG CAPSULE: 100 | 90 days supply | Qty: 270 | Fill #0

## 2017-04-19 MED FILL — SUMATRIPTAN SUCC 100 MG TAB: 100 | 30 days supply | Qty: 8 | Fill #0

## 2017-05-08 DIAGNOSIS — M542 Cervicalgia: Secondary | ICD-10-CM | POA: Diagnosis not present

## 2017-05-08 DIAGNOSIS — G43839 Menstrual migraine, intractable, without status migrainosus: Secondary | ICD-10-CM | POA: Diagnosis not present

## 2017-05-08 DIAGNOSIS — R51 Headache: Secondary | ICD-10-CM | POA: Diagnosis not present

## 2017-05-08 DIAGNOSIS — G43109 Migraine with aura, not intractable, without status migrainosus: Secondary | ICD-10-CM | POA: Diagnosis not present

## 2017-05-08 DIAGNOSIS — G43719 Chronic migraine without aura, intractable, without status migrainosus: Secondary | ICD-10-CM | POA: Diagnosis not present

## 2017-05-08 DIAGNOSIS — M791 Myalgia, unspecified site: Secondary | ICD-10-CM | POA: Diagnosis not present

## 2017-05-08 DIAGNOSIS — G43019 Migraine without aura, intractable, without status migrainosus: Secondary | ICD-10-CM | POA: Diagnosis not present

## 2017-05-19 ENCOUNTER — Other Ambulatory Visit (HOSPITAL_COMMUNITY): Payer: Self-pay | Admitting: Psychiatry

## 2017-05-19 DIAGNOSIS — F330 Major depressive disorder, recurrent, mild: Secondary | ICD-10-CM

## 2017-05-19 MED FILL — SUMATRIPTAN SUCC 100 MG TAB: 100 | 30 days supply | Qty: 9 | Fill #0

## 2017-05-22 ENCOUNTER — Other Ambulatory Visit (HOSPITAL_COMMUNITY): Payer: Self-pay | Admitting: Psychiatry

## 2017-05-22 DIAGNOSIS — F330 Major depressive disorder, recurrent, mild: Secondary | ICD-10-CM

## 2017-05-23 ENCOUNTER — Other Ambulatory Visit (HOSPITAL_COMMUNITY): Payer: Self-pay

## 2017-05-23 DIAGNOSIS — F330 Major depressive disorder, recurrent, mild: Secondary | ICD-10-CM

## 2017-05-23 MED ORDER — TRAZODONE HCL 100 MG PO TABS: 100.0000 mg | ORAL_TABLET | Freq: Every day | ORAL | 0 refills | Status: DC

## 2017-05-23 MED FILL — DASETTA 1-35-28 TABLET: 1-35 | 84 days supply | Qty: 112 | Fill #2

## 2017-05-23 MED FILL — traZODone HCL 100 MG TABS: 100 | 30 days supply | Qty: 30 | Fill #0

## 2017-05-23 MED FILL — ZOMIG 5 MG NASAL SPRAY: 5 | 30 days supply | Qty: 6 | Fill #2

## 2017-05-25 MED FILL — predniSONE 20 MG TABS: 20 | 4 days supply | Qty: 10 | Fill #0

## 2017-05-30 ENCOUNTER — Ambulatory Visit (HOSPITAL_COMMUNITY): Payer: 59 | Admitting: Psychiatry

## 2017-05-30 ENCOUNTER — Encounter (HOSPITAL_COMMUNITY): Payer: Self-pay | Admitting: Psychiatry

## 2017-05-30 DIAGNOSIS — Z818 Family history of other mental and behavioral disorders: Secondary | ICD-10-CM

## 2017-05-30 DIAGNOSIS — G4700 Insomnia, unspecified: Secondary | ICD-10-CM

## 2017-05-30 DIAGNOSIS — F330 Major depressive disorder, recurrent, mild: Secondary | ICD-10-CM | POA: Diagnosis not present

## 2017-05-30 DIAGNOSIS — Z810 Family history of intellectual disabilities: Secondary | ICD-10-CM | POA: Diagnosis not present

## 2017-05-30 MED ORDER — ESCITALOPRAM OXALATE 20 MG PO TABS: 20.0000 mg | ORAL_TABLET | Freq: Every day | ORAL | 0 refills | Status: DC

## 2017-05-30 MED FILL — ESCITALOPRAM 20 MG TABLET: 20 | 90 days supply | Qty: 90 | Fill #0

## 2017-05-30 NOTE — Progress Notes (Signed)
[place] MD/PA/NP OP Progress Note  05/30/2017 2:35 PM [name]  MRN:  [id number]  Chief Complaint: I am doing better.  I start working again after 8 years of gap.  Things are going well.  HPI: This but came for her follow-up appointment.  She started working after 8 years of.  She is adjusting well and she is working few days a week.  She is excited about upcoming 12 miles marathon in October.  She like Lexapro.  She denies any side effects.  Her anxiety depression is stable.  She is sleeping good with 50 mg of trazodone.  Her energy level is good.  She denies any irritability, anger, mania, psychosis.  She denies any crying spells or any panic attacks.  She like to continue Lexapro.  She really take Trilafon for acute headache.  She is getting Botox injection which have helped her migraine attacks.  Her appetite is okay.  She is able to lose weight from the past.  She is active and her energy level is good.  Visit Diagnosis:    ICD-10-CM   1. Major depressive disorder, recurrent episode, mild (HCC) F33.0 escitalopram (LEXAPRO) 20 MG tablet    Past Psychiatric History: Reviewed. Patient has history of depression since her teens. In the past she had tried Paxil, Lunesta and briefly Wellbutrin.   She had a good response with Cymbalta until it stopped working.  She denies any history of suicidal attempt, psychiatric inpatient treatment, psychosis or any self abusive behavior.  Past Medical History:  Past Medical History:  Diagnosis Date  . ASCUS (atypical squamous cells of undetermined significance) on Pap smear   . Depression   . Kidney stones   . Migraines     Past Surgical History:  Procedure Laterality Date  . ANKLE SURGERY  1996   RECONSTRUCTION   . APPENDECTOMY    . BREAST SURGERY  2004   REDUCTION BY DR. Stephanie Coup  . CHOLECYSTECTOMY  2003  . REDUCTION MAMMAPLASTY Bilateral     Family Psychiatric History: Reviewed.  Family History:  Family History  Problem Relation Age of  Onset  . Hypertension Father   . Depression Father   . Depression Other   . Hyperlipidemia Mother   . Cancer Maternal Grandmother        COLON  . Depression Maternal Grandmother   . Alzheimer's disease Maternal Grandmother   . Hyperlipidemia Maternal Grandmother   . Cancer Maternal Grandfather        COLON  . Depression Maternal Aunt   . Depression Paternal Grandmother   . Hypertension Paternal Grandfather     Social History:  Social History   Socioeconomic History  . Marital status: Married    Spouse name: Not on file  . Number of children: Not on file  . Years of education: Not on file  . Highest education level: Not on file  Occupational History  . Not on file  Social Needs  . Financial resource strain: Not on file  . Food insecurity:    Worry: Not on file    Inability: Not on file  . Transportation needs:    Medical: Not on file    Non-medical: Not on file  Tobacco Use  . Smoking status: Never Smoker  . Smokeless tobacco: Never Used  Substance and Sexual Activity  . Alcohol use: Yes    Alcohol/week: 0.0 oz    Comment: Rare  . Drug use: No  . Sexual activity: Yes  Birth control/protection: Pill    Comment: 1st intercourse 1 yo-5 partners  Lifestyle  . Physical activity:    Days per week: Not on file    Minutes per session: Not on file  . Stress: Not on file  Relationships  . Social connections:    Talks on phone: Not on file    Gets together: Not on file    Attends religious service: Not on file    Active member of club or organization: Not on file    Attends meetings of clubs or organizations: Not on file    Relationship status: Not on file  Other Topics Concern  . Not on file  Social History Narrative  . Not on file    Allergies:  Allergies  Allergen Reactions  . Compazine   . Other     LATEX ALLERGY    Metabolic Disorder Labs: Lab Results  Component Value Date   HGBA1C 5.1 11/03/2011   MPG 100 11/03/2011   No results found for:  PROLACTIN Lab Results  Component Value Date   CHOL 230 (H) 10/17/2012   TRIG 139 10/17/2012   HDL 75 10/17/2012   CHOLHDL 3.1 10/17/2012   VLDL 28 10/17/2012   LDLCALC 127 (H) 10/17/2012   LDLCALC 127 (H) 09/21/2012   No results found for: TSH  Therapeutic Level Labs: No results found for: LITHIUM No results found for: VALPROATE No components found for:  CBMZ  Current Medications: Current Outpatient Medications  Medication Sig Dispense Refill  . BOTOX 100 units SOLR injection INJECT 200 UNITS INTRAMUSCULARLY IN THE HEAD, NECK AND TRUNK AREAS EVERY 12 WEEKS  3  . clobetasol cream (TEMOVATE) 0.05 % Apply to affected area twice daily as needed 30 g 1  . escitalopram (LEXAPRO) 20 MG tablet Take 1 tablet (20 mg total) by mouth daily. 90 tablet 0  . fluconazole (DIFLUCAN) 200 MG tablet Take 1 tablet (200 mg total) by mouth daily. (Patient not taking: Reported on 09/30/2016) 5 tablet 0  . Magnesium 250 MG TABS Take 250 mg by mouth 3 (three) times daily.      . norethindrone-ethinyl estradiol 1/35 (ORTHO-NOVUM, [name]) tablet Patient takes active pills only skipping placebo pills 4 Package 3  . Omega-3 Fatty Acids (OMEGA 3 PO) Take by mouth.      . perphenazine (TRILAFON) 4 MG tablet   0  . SUMAtriptan (IMITREX) 100 MG tablet Take 100 mg by mouth as needed.      . traZODone (DESYREL) 100 MG tablet TAKE 1 TABLET BY MOUTH AT BEDTIME. 30 tablet 0  . traZODone (DESYREL) 100 MG tablet Take 1 tablet (100 mg total) by mouth at bedtime. 30 tablet 0  . ZOMIG 5 MG nasal solution   3  . zonisamide (ZONEGRAN) 100 MG capsule Take 300 mg by mouth daily.      No current facility-administered medications for this visit.      Musculoskeletal: Strength & Muscle Tone: within normal limits Gait & Station: normal Patient leans: N/A  Psychiatric Specialty Exam: ROS  Blood pressure 117/80, pulse 73, height 5\' 8"  (1.727 m), weight 214 lb (97.1 kg), SpO2 98 %.Body mass index is 32.54 kg/m.   General Appearance: Casual  Eye Contact:  Good  Speech:  Clear and Coherent  Volume:  Normal  Mood:  Euthymic  Affect:  Congruent  Thought Process:  Goal Directed  Orientation:  Full (Time, Place, and Person)  Thought Content: Logical   Suicidal Thoughts:  No  Homicidal Thoughts:  No  Memory:  Immediate;   Good Recent;   Good Remote;   Good  Judgement:  Good  Insight:  Good  Psychomotor Activity:  Normal  Concentration:  Concentration: Good and Attention Span: Good  Recall:  Good  Fund of Knowledge: Good  Language: Good  Akathisia:  No  Handed:  Right  AIMS (if indicated): not done  Assets:  Communication Skills Desire for Improvement Housing Physical Health Resilience  ADL's:  Intact  Cognition: WNL  Sleep:  Good   Screenings: PHQ2-9     Office Visit from 06/21/2016 in [place]  PHQ-2 Total Score  2  PHQ-9 Total Score  8       Assessment and Plan: Major depressive disorder, recurrent.  Insomnia.  Patient is a stable on Lexapro.  She takes trazodone 50 mg as needed for insomnia.  Discussed medication side effects and benefits.  Recommended to call us back if she has any question, concern if you feel worsening of the symptoms.  Follow-up in 3 months.   Kathlee Nations, MD 05/30/2017, 2:36 PM

## 2017-06-14 MED FILL — SUMATRIPTAN SUCC 100 MG TAB: 100 | 30 days supply | Qty: 9 | Fill #0

## 2017-06-22 DIAGNOSIS — R51 Headache: Secondary | ICD-10-CM | POA: Diagnosis not present

## 2017-06-22 DIAGNOSIS — M791 Myalgia, unspecified site: Secondary | ICD-10-CM | POA: Diagnosis not present

## 2017-06-22 DIAGNOSIS — G43109 Migraine with aura, not intractable, without status migrainosus: Secondary | ICD-10-CM | POA: Diagnosis not present

## 2017-06-22 DIAGNOSIS — G43719 Chronic migraine without aura, intractable, without status migrainosus: Secondary | ICD-10-CM | POA: Diagnosis not present

## 2017-06-22 DIAGNOSIS — M542 Cervicalgia: Secondary | ICD-10-CM | POA: Diagnosis not present

## 2017-06-22 DIAGNOSIS — G43019 Migraine without aura, intractable, without status migrainosus: Secondary | ICD-10-CM | POA: Diagnosis not present

## 2017-06-22 DIAGNOSIS — G43839 Menstrual migraine, intractable, without status migrainosus: Secondary | ICD-10-CM | POA: Diagnosis not present

## 2017-06-28 MED FILL — ZOMIG 5 MG NASAL SPRAY: 5 | 30 days supply | Qty: 6 | Fill #0

## 2017-07-26 DIAGNOSIS — M791 Myalgia, unspecified site: Secondary | ICD-10-CM | POA: Diagnosis not present

## 2017-07-26 DIAGNOSIS — G43019 Migraine without aura, intractable, without status migrainosus: Secondary | ICD-10-CM | POA: Diagnosis not present

## 2017-07-26 DIAGNOSIS — R51 Headache: Secondary | ICD-10-CM | POA: Diagnosis not present

## 2017-07-26 DIAGNOSIS — G43719 Chronic migraine without aura, intractable, without status migrainosus: Secondary | ICD-10-CM | POA: Diagnosis not present

## 2017-07-26 DIAGNOSIS — G43839 Menstrual migraine, intractable, without status migrainosus: Secondary | ICD-10-CM | POA: Diagnosis not present

## 2017-07-26 DIAGNOSIS — G43109 Migraine with aura, not intractable, without status migrainosus: Secondary | ICD-10-CM | POA: Diagnosis not present

## 2017-07-26 DIAGNOSIS — M542 Cervicalgia: Secondary | ICD-10-CM | POA: Diagnosis not present

## 2017-07-26 MED FILL — SUMATRIPTAN SUCC 100 MG TAB: 100 | 30 days supply | Qty: 9 | Fill #0

## 2017-07-26 MED FILL — ZONISAMIDE 100 MG CAPSULE: 100 | 90 days supply | Qty: 270 | Fill #0

## 2017-08-02 ENCOUNTER — Other Ambulatory Visit (HOSPITAL_COMMUNITY): Payer: Self-pay | Admitting: Psychiatry

## 2017-08-02 DIAGNOSIS — F330 Major depressive disorder, recurrent, mild: Secondary | ICD-10-CM

## 2017-08-02 MED FILL — AIMOVIG 70 MG/ML SOAJ: 70 | 30 days supply | Qty: 1 | Fill #0

## 2017-08-03 MED FILL — traZODone HCL 100 MG TABS: 100 | 30 days supply | Qty: 30 | Fill #0

## 2017-08-03 NOTE — Telephone Encounter (Signed)
A one time refill of patient's prescribed Trazodone approved by Dr. Dwyane Dee in Dr. Marguerite Olea absence and order e-scribed to patient's pharmacy as approved.

## 2017-08-17 MED FILL — ZOMIG 5 MG NASAL SPRAY: 5 | 30 days supply | Qty: 6 | Fill #1

## 2017-08-17 MED FILL — DASETTA 1-35-28 TABLET: 1-35 | 84 days supply | Qty: 112 | Fill #3

## 2017-08-31 ENCOUNTER — Ambulatory Visit (HOSPITAL_COMMUNITY): Payer: Self-pay | Admitting: Psychiatry

## 2017-08-31 MED FILL — AIMOVIG 70 MG/ML SOAJ: 70 | 30 days supply | Qty: 1 | Fill #1

## 2017-08-31 MED FILL — SUMATRIPTAN SUCC 100 MG TAB: 100 | 30 days supply | Qty: 9 | Fill #1

## 2017-09-05 ENCOUNTER — Other Ambulatory Visit (HOSPITAL_COMMUNITY): Payer: Self-pay | Admitting: Psychiatry

## 2017-09-05 DIAGNOSIS — F330 Major depressive disorder, recurrent, mild: Secondary | ICD-10-CM

## 2017-09-06 ENCOUNTER — Other Ambulatory Visit (HOSPITAL_COMMUNITY): Payer: Self-pay

## 2017-09-06 DIAGNOSIS — M542 Cervicalgia: Secondary | ICD-10-CM | POA: Diagnosis not present

## 2017-09-06 DIAGNOSIS — R51 Headache: Secondary | ICD-10-CM | POA: Diagnosis not present

## 2017-09-06 DIAGNOSIS — F330 Major depressive disorder, recurrent, mild: Secondary | ICD-10-CM

## 2017-09-06 DIAGNOSIS — M791 Myalgia, unspecified site: Secondary | ICD-10-CM | POA: Diagnosis not present

## 2017-09-06 DIAGNOSIS — G43719 Chronic migraine without aura, intractable, without status migrainosus: Secondary | ICD-10-CM | POA: Diagnosis not present

## 2017-09-06 DIAGNOSIS — G43019 Migraine without aura, intractable, without status migrainosus: Secondary | ICD-10-CM | POA: Diagnosis not present

## 2017-09-06 MED ORDER — ESCITALOPRAM OXALATE 20 MG PO TABS: 20.0000 mg | ORAL_TABLET | Freq: Every day | ORAL | 0 refills | Status: DC

## 2017-09-06 MED FILL — ESCITALOPRAM 20 MG TABLET: 20 | 90 days supply | Qty: 90 | Fill #0

## 2017-09-11 ENCOUNTER — Other Ambulatory Visit: Payer: Self-pay | Admitting: Gynecology

## 2017-09-11 NOTE — Telephone Encounter (Signed)
Annual exam on 10/02/17

## 2017-09-20 MED FILL — ZOMIG 5 MG NASAL SPRAY: 5 | 30 days supply | Qty: 6 | Fill #2

## 2017-10-02 ENCOUNTER — Encounter: Payer: 59 | Admitting: Gynecology

## 2017-10-02 MED FILL — SUMAtriptan SUCCINATE 100 M: 100 | 30 days supply | Qty: 9 | Fill #2

## 2017-10-11 ENCOUNTER — Other Ambulatory Visit (HOSPITAL_COMMUNITY): Payer: Self-pay | Admitting: Psychiatry

## 2017-10-11 ENCOUNTER — Ambulatory Visit (INDEPENDENT_AMBULATORY_CARE_PROVIDER_SITE_OTHER): Payer: 59 | Admitting: Gynecology

## 2017-10-11 ENCOUNTER — Encounter: Payer: Self-pay | Admitting: Gynecology

## 2017-10-11 VITALS — BP 124/80 | Ht 68.5 in | Wt 220.0 lb

## 2017-10-11 DIAGNOSIS — R5382 Chronic fatigue, unspecified: Secondary | ICD-10-CM | POA: Diagnosis not present

## 2017-10-11 DIAGNOSIS — F330 Major depressive disorder, recurrent, mild: Secondary | ICD-10-CM

## 2017-10-11 DIAGNOSIS — Z01419 Encounter for gynecological examination (general) (routine) without abnormal findings: Secondary | ICD-10-CM | POA: Diagnosis not present

## 2017-10-11 MED ORDER — NORETHINDRONE-ETH ESTRADIOL 1-35 MG-MCG PO TABS: ORAL_TABLET | ORAL | 4 refills | Status: DC

## 2017-10-11 MED FILL — AIMOVIG 70 MG/ML SOAJ: 70 | 30 days supply | Qty: 1 | Fill #2

## 2017-10-11 NOTE — Patient Instructions (Signed)
Follow-up for lab test results.  Follow-up in 1 year for annual exam

## 2017-10-11 NOTE — Progress Notes (Signed)
    [name] 1975/04/02 [id number]        42 y.o.  G0P0 for annual gynecologic exam.  Also complaining of fatigue over the last several months.  Generalized tiredness.  No weight changes, skin or hair changes.  No nausea vomiting constipation or diarrhea.  She does have a history of depression and recently started on Lexapro beginning of this year.  Also on a new injectable medication for migraines.  She feels this is different than depression and she felt this way before starting the new migraine medication.  No history of rashes arthralgias myalgias.  Past medical history,surgical history, problem list, medications, allergies, family history and social history were all reviewed and documented as reviewed in the EPIC chart.  ROS:  Performed with pertinent positives and negatives included in the history, assessment and plan.   Additional significant findings : None   Exam: Caryn Bee assistant Vitals:   10/11/17 1610  BP: 124/80  Weight: 220 lb (99.8 kg)  Height: 5' 8.5" (1.74 m)   Body mass index is 32.96 kg/m.  General appearance:  Normal affect, orientation and appearance. Skin: Grossly normal HEENT: Without gross lesions.  No cervical or supraclavicular adenopathy. Thyroid normal.  Lungs:  Clear without wheezing, rales or rhonchi Cardiac: RR, without RMG Abdominal:  Soft, nontender, without masses, guarding, rebound, organomegaly or hernia Breasts:  Examined lying and sitting without masses, retractions, discharge or axillary adenopathy.  Well-healed bilateral reduction scars Pelvic:  Ext, BUS, Vagina: Normal  Cervix: Normal  Uterus: Anteverted, normal size, shape and contour, midline and mobile nontender   Adnexa: Without masses or tenderness    Anus and perineum: Normal   Rectovaginal: Normal sphincter tone without palpated masses or tenderness.    Assessment/Plan:  42 y.o. G0P0 female for annual gynecologic exam without menses, continuous oral contraceptives.    1. Continuous oral contraceptives.  Doing well and wants to continue.  We have discussed the risks of thrombosis multiple times.  Refill x1 year provided. 2. Fatigue.  No localizing symptoms.  Discussed various possibilities.  Will check baseline CBC CMP thyroid panel ANA and Lyme's screen.  Will recommend follow-up with her primary physician if symptoms continue. 3. Mammography due now and patient is going to call and schedule.  Breast exam normal today. 4. Pap smear/HPV 08/2014.  No Pap smear done today.  History of ASCUS Pap smears with negative HPV in the past but normal Pap smears following.  Plan repeat Pap smear/HPV at 5-year interval per current screening guidelines. 5. Health maintenance.  No routine lab work done as patient does this elsewhere.  Follow-up for above lab work.  Follow-up in 1 year for annual exam.   Anastasio Auerbach MD, 4:45 PM 10/11/2017

## 2017-10-13 ENCOUNTER — Encounter: Payer: Self-pay | Admitting: Gynecology

## 2017-10-13 LAB — COMPREHENSIVE METABOLIC PANEL
AG RATIO: 1.6 (calc) (ref 1.0–2.5)
ALBUMIN MSPROF: 3.8 g/dL (ref 3.6–5.1)
ALT: 9 U/L (ref 6–29)
AST: 16 U/L (ref 10–30)
Alkaline phosphatase (APISO): 62 U/L (ref 33–115)
BILIRUBIN TOTAL: 0.2 mg/dL (ref 0.2–1.2)
BUN/Creatinine Ratio: 14 (calc) (ref 6–22)
BUN: 16 mg/dL (ref 7–25)
CALCIUM: 8.7 mg/dL (ref 8.6–10.2)
CHLORIDE: 106 mmol/L (ref 98–110)
CO2: 25 mmol/L (ref 20–32)
CREATININE: 1.14 mg/dL — AB (ref 0.50–1.10)
GLOBULIN: 2.4 g/dL (ref 1.9–3.7)
Glucose, Bld: 93 mg/dL (ref 65–99)
POTASSIUM: 4 mmol/L (ref 3.5–5.3)
SODIUM: 137 mmol/L (ref 135–146)
Total Protein: 6.2 g/dL (ref 6.1–8.1)

## 2017-10-13 LAB — CBC WITH DIFFERENTIAL/PLATELET
Basophils Absolute: 27 cells/uL (ref 0–200)
Basophils Relative: 0.4 %
Eosinophils Absolute: 136 cells/uL (ref 15–500)
Eosinophils Relative: 2 %
HEMATOCRIT: 36.4 % (ref 35.0–45.0)
HEMOGLOBIN: 12.8 g/dL (ref 11.7–15.5)
LYMPHS ABS: 2414 {cells}/uL (ref 850–3900)
MCH: 32.7 pg (ref 27.0–33.0)
MCHC: 35.2 g/dL (ref 32.0–36.0)
MCV: 92.9 fL (ref 80.0–100.0)
MPV: 10 fL (ref 7.5–12.5)
Monocytes Relative: 10.1 %
NEUTROS PCT: 52 %
Neutro Abs: 3536 cells/uL (ref 1500–7800)
Platelets: 274 10*3/uL (ref 140–400)
RBC: 3.92 10*6/uL (ref 3.80–5.10)
RDW: 11.7 % (ref 11.0–15.0)
Total Lymphocyte: 35.5 %
WBC: 6.8 10*3/uL (ref 3.8–10.8)
WBCMIX: 687 {cells}/uL (ref 200–950)

## 2017-10-13 LAB — THYROID PANEL WITH TSH
Free Thyroxine Index: 2.3 (ref 1.4–3.8)
T3 UPTAKE: 23 % (ref 22–35)
T4, Total: 10.2 ug/dL (ref 5.1–11.9)
TSH: 1.64 mIU/L

## 2017-10-13 LAB — ANA: Anti Nuclear Antibody(ANA): NEGATIVE

## 2017-10-13 LAB — B. BURGDORFI ANTIBODIES

## 2017-10-16 ENCOUNTER — Encounter: Payer: Self-pay | Admitting: Gynecology

## 2017-10-16 ENCOUNTER — Other Ambulatory Visit (HOSPITAL_COMMUNITY): Payer: Self-pay

## 2017-10-16 DIAGNOSIS — F330 Major depressive disorder, recurrent, mild: Secondary | ICD-10-CM

## 2017-10-16 MED ORDER — TRAZODONE HCL 100 MG PO TABS: 100.0000 mg | ORAL_TABLET | Freq: Every day | ORAL | 0 refills | Status: DC

## 2017-10-16 MED FILL — traZODone HCL 100 MG TABS: 100 | 30 days supply | Qty: 30 | Fill #0

## 2017-10-18 DIAGNOSIS — G43719 Chronic migraine without aura, intractable, without status migrainosus: Secondary | ICD-10-CM | POA: Diagnosis not present

## 2017-10-18 DIAGNOSIS — M791 Myalgia, unspecified site: Secondary | ICD-10-CM | POA: Diagnosis not present

## 2017-10-18 DIAGNOSIS — G43109 Migraine with aura, not intractable, without status migrainosus: Secondary | ICD-10-CM | POA: Diagnosis not present

## 2017-10-18 DIAGNOSIS — G43019 Migraine without aura, intractable, without status migrainosus: Secondary | ICD-10-CM | POA: Diagnosis not present

## 2017-10-18 DIAGNOSIS — R51 Headache: Secondary | ICD-10-CM | POA: Diagnosis not present

## 2017-10-18 DIAGNOSIS — G43839 Menstrual migraine, intractable, without status migrainosus: Secondary | ICD-10-CM | POA: Diagnosis not present

## 2017-10-18 DIAGNOSIS — M542 Cervicalgia: Secondary | ICD-10-CM | POA: Diagnosis not present

## 2017-10-23 ENCOUNTER — Encounter (HOSPITAL_COMMUNITY): Payer: Self-pay | Admitting: Psychiatry

## 2017-10-23 ENCOUNTER — Ambulatory Visit (HOSPITAL_COMMUNITY): Payer: 59 | Admitting: Psychiatry

## 2017-10-23 DIAGNOSIS — G4700 Insomnia, unspecified: Secondary | ICD-10-CM

## 2017-10-23 DIAGNOSIS — F330 Major depressive disorder, recurrent, mild: Secondary | ICD-10-CM

## 2017-10-23 MED ORDER — ESCITALOPRAM OXALATE 20 MG PO TABS: 20.0000 mg | ORAL_TABLET | Freq: Every day | ORAL | 0 refills | Status: DC

## 2017-10-23 NOTE — Progress Notes (Signed)
[place] MD/PA/NP OP Progress Note  10/23/2017 3:17 PM [name]  MRN:  [id number]  Chief Complaint: I started working full-time.  I am feeling better.  HPI: Patient came for her follow-up appointment.  She started working 40 hours a week at [facility] [facility].  She is happy because she feel worthy and also her job keeps her busy.  She admitted lately she has been not doing her regular exercise because she is busy at work.  She remember a month ago feeling very tired and she had blood work and her TSH was normal.  She is feeling better but I recommended if she again have symptoms of feeling tired then she should get X90, folic acid and vitamin D level.  Patient denies any irritability, anger, mania, psychosis or any hallucination.  She started new medication for headaches which is much better.  Her appetite is okay.  She denies any suicidal thoughts or homicidal thought.  Visit Diagnosis:    ICD-10-CM   1. Major depressive disorder, recurrent episode, mild (HCC) F33.0 escitalopram (LEXAPRO) 20 MG tablet    Past Psychiatric History: Viewed Patient has history of depression since her teens. In the past she had tried Paxil, Lunesta and briefly Wellbutrin.She had a good response with Cymbalta until it stopped working. She denies any history of suicidal attempt, psychiatric inpatient treatment, psychosis or any self abusive behavior. Past Medical History:  Past Medical History:  Diagnosis Date  . ASCUS (atypical squamous cells of undetermined significance) on Pap smear   . Depression   . Kidney stones   . Migraines     Past Surgical History:  Procedure Laterality Date  . ANKLE SURGERY  1996   RECONSTRUCTION   . APPENDECTOMY    . BREAST SURGERY  2004   REDUCTION BY DR. Stephanie Coup  . CHOLECYSTECTOMY  2003  . REDUCTION MAMMAPLASTY Bilateral     Family Psychiatric History: Viewed  Family History:  Family History  Problem Relation Age of Onset  . Hypertension Father   .  Depression Father   . Depression Other   . Hyperlipidemia Mother   . Cancer Maternal Grandmother        COLON  . Depression Maternal Grandmother   . Alzheimer's disease Maternal Grandmother   . Hyperlipidemia Maternal Grandmother   . Cancer Maternal Grandfather        COLON  . Depression Maternal Aunt   . Depression Paternal Grandmother   . Hypertension Paternal Grandfather     Social History:  Social History   Socioeconomic History  . Marital status: Married    Spouse name: Not on file  . Number of children: Not on file  . Years of education: Not on file  . Highest education level: Not on file  Occupational History  . Not on file  Social Needs  . Financial resource strain: Not on file  . Food insecurity:    Worry: Not on file    Inability: Not on file  . Transportation needs:    Medical: Not on file    Non-medical: Not on file  Tobacco Use  . Smoking status: Never Smoker  . Smokeless tobacco: Never Used  Substance and Sexual Activity  . Alcohol use: Yes    Alcohol/week: 0.0 standard drinks    Comment: Rare  . Drug use: No  . Sexual activity: Yes    Birth control/protection: Pill    Comment: 1st intercourse 85 yo-5 partners  Lifestyle  . Physical activity:  Days per week: Not on file    Minutes per session: Not on file  . Stress: Not on file  Relationships  . Social connections:    Talks on phone: Not on file    Gets together: Not on file    Attends religious service: Not on file    Active member of club or organization: Not on file    Attends meetings of clubs or organizations: Not on file    Relationship status: Not on file  Other Topics Concern  . Not on file  Social History Narrative  . Not on file    Allergies:  Allergies  Allergen Reactions  . Compazine   . Other     LATEX ALLERGY    Metabolic Disorder Labs: Lab Results  Component Value Date   HGBA1C 5.1 11/03/2011   MPG 100 11/03/2011   No results found for: PROLACTIN Lab Results   Component Value Date   CHOL 230 (H) 10/17/2012   TRIG 139 10/17/2012   HDL 75 10/17/2012   CHOLHDL 3.1 10/17/2012   VLDL 28 10/17/2012   LDLCALC 127 (H) 10/17/2012   LDLCALC 127 (H) 09/21/2012   Lab Results  Component Value Date   TSH 1.64 10/11/2017    Therapeutic Level Labs: No results found for: LITHIUM No results found for: VALPROATE No components found for:  CBMZ  Current Medications: Current Outpatient Medications  Medication Sig Dispense Refill  . clobetasol cream (TEMOVATE) 0.05 % Apply to affected area twice daily as needed 30 g 1  . Erenumab-aooe (AIMOVIG) 70 MG/ML SOAJ Inject into the skin.    Marland Kitchen escitalopram (LEXAPRO) 20 MG tablet Take 1 tablet (20 mg total) by mouth daily. 90 tablet 0  . Magnesium 250 MG TABS Take 250 mg by mouth 3 (three) times daily.      . norethindrone-ethinyl estradiol 1/35 (DASETTA 1/35) tablet TAKE 1 TABLET BY MOUTH DAILY, SKIP THE PLACEBO PILLS 112 tablet 4  . Omega-3 Fatty Acids (OMEGA 3 PO) Take by mouth.      . perphenazine (TRILAFON) 4 MG tablet   0  . SUMAtriptan (IMITREX) 100 MG tablet Take 100 mg by mouth as needed.      . traZODone (DESYREL) 100 MG tablet TAKE 1 TABLET BY MOUTH AT BEDTIME. 30 tablet 0  . traZODone (DESYREL) 100 MG tablet Take 1 tablet (100 mg total) by mouth at bedtime. 30 tablet 0  . ZOMIG 5 MG nasal solution   3  . zonisamide (ZONEGRAN) 100 MG capsule Take 300 mg by mouth daily.      No current facility-administered medications for this visit.      Musculoskeletal: Strength & Muscle Tone: within normal limits Gait & Station: normal Patient leans: N/A  Psychiatric Specialty Exam: ROS  Blood pressure 120/85, pulse 77, height 5' 8.5" (1.74 m), weight 219 lb (99.3 kg), SpO2 98 %.There is no height or weight on file to calculate BMI.  General Appearance: Casual  Eye Contact:  Good  Speech:  Clear and Coherent  Volume:  Normal  Mood:  Euthymic  Affect:  Appropriate  Thought Process:  Goal Directed   Orientation:  Full (Time, Place, and Person)  Thought Content: Logical   Suicidal Thoughts:  No  Homicidal Thoughts:  No  Memory:  Immediate;   Good Recent;   Good Remote;   Good  Judgement:  Good  Insight:  Good  Psychomotor Activity:  Normal  Concentration:  Concentration: Good and Attention Span: Good  Recall:  Good  Fund of Knowledge: Good  Language: Good  Akathisia:  No  Handed:  Right  AIMS (if indicated): not done  Assets:  Communication Skills Desire for Improvement Housing Resilience Social Support  ADL's:  Intact  Cognition: WNL  Sleep:  Good   Screenings: PHQ2-9     Office Visit from 06/21/2016 in [place]  PHQ-2 Total Score  2  PHQ-9 Total Score  8       Assessment and Plan: Major depressive disorder, recurrent.  Insomnia.  Patient is a stable on her current medication.  Continue Lexapro and trazodone 50 mg at bedtime for insomnia.  Discussed medication side effects and benefits.  Recommended to call us back if he has any question or any concern.  Follow-up in 6 months.   Kathlee Nations, MD 10/23/2017, 3:17 PM

## 2017-11-07 MED FILL — ZONISAMIDE 100 MG CAPSULE: 100 | 90 days supply | Qty: 270 | Fill #0

## 2017-11-07 MED FILL — ALYACEN 1-35-28 TABLET: 1-35 | 84 days supply | Qty: 112 | Fill #0

## 2017-11-07 MED FILL — AIMOVIG 70 MG/ML SOAJ: 70 | 30 days supply | Qty: 1 | Fill #0

## 2017-11-07 MED FILL — SUMAtriptan SUCCINATE 100 M: 100 | 30 days supply | Qty: 9 | Fill #0

## 2017-11-22 DIAGNOSIS — Z6833 Body mass index (BMI) 33.0-33.9, adult: Secondary | ICD-10-CM | POA: Diagnosis not present

## 2017-11-22 DIAGNOSIS — H01139 Eczematous dermatitis of unspecified eye, unspecified eyelid: Secondary | ICD-10-CM | POA: Diagnosis not present

## 2017-12-04 DIAGNOSIS — G43719 Chronic migraine without aura, intractable, without status migrainosus: Secondary | ICD-10-CM | POA: Diagnosis not present

## 2017-12-04 DIAGNOSIS — G43019 Migraine without aura, intractable, without status migrainosus: Secondary | ICD-10-CM | POA: Diagnosis not present

## 2017-12-04 DIAGNOSIS — M542 Cervicalgia: Secondary | ICD-10-CM | POA: Diagnosis not present

## 2017-12-04 DIAGNOSIS — G43839 Menstrual migraine, intractable, without status migrainosus: Secondary | ICD-10-CM | POA: Diagnosis not present

## 2017-12-04 DIAGNOSIS — R51 Headache: Secondary | ICD-10-CM | POA: Diagnosis not present

## 2017-12-04 DIAGNOSIS — G43109 Migraine with aura, not intractable, without status migrainosus: Secondary | ICD-10-CM | POA: Diagnosis not present

## 2017-12-04 DIAGNOSIS — M791 Myalgia, unspecified site: Secondary | ICD-10-CM | POA: Diagnosis not present

## 2017-12-04 MED FILL — SUMAtriptan SUCCINATE 100 M: 100 | 30 days supply | Qty: 9 | Fill #1

## 2017-12-04 MED FILL — ESCITALOPRAM 20 MG TABLET: 20 | 90 days supply | Qty: 90 | Fill #0

## 2017-12-18 MED FILL — AIMOVIG 140 MG/ML SOAJ: 140 | 30 days supply | Qty: 1 | Fill #0

## 2018-01-02 ENCOUNTER — Other Ambulatory Visit: Payer: Self-pay | Admitting: Gynecology

## 2018-01-02 DIAGNOSIS — Z1231 Encounter for screening mammogram for malignant neoplasm of breast: Secondary | ICD-10-CM

## 2018-01-02 MED FILL — SUMAtriptan SUCCINATE 100 M: 100 | 30 days supply | Qty: 9 | Fill #2

## 2018-01-03 ENCOUNTER — Ambulatory Visit
Admission: RE | Admit: 2018-01-03 | Discharge: 2018-01-03 | Disposition: A | Discharge status: Home / Self Care | Payer: 59 | Source: Ambulatory Visit | Attending: Gynecology | Admitting: Gynecology

## 2018-01-03 DIAGNOSIS — G43909 Migraine, unspecified, not intractable, without status migrainosus: Secondary | ICD-10-CM | POA: Diagnosis not present

## 2018-01-03 DIAGNOSIS — D225 Melanocytic nevi of trunk: Secondary | ICD-10-CM | POA: Diagnosis not present

## 2018-01-03 DIAGNOSIS — R82998 Other abnormal findings in urine: Secondary | ICD-10-CM | POA: Diagnosis not present

## 2018-01-03 DIAGNOSIS — Z1389 Encounter for screening for other disorder: Secondary | ICD-10-CM | POA: Diagnosis not present

## 2018-01-03 DIAGNOSIS — I00 Rheumatic fever without heart involvement: Secondary | ICD-10-CM | POA: Diagnosis not present

## 2018-01-03 DIAGNOSIS — Z1231 Encounter for screening mammogram for malignant neoplasm of breast: Secondary | ICD-10-CM | POA: Diagnosis not present

## 2018-01-03 DIAGNOSIS — E7849 Other hyperlipidemia: Secondary | ICD-10-CM | POA: Diagnosis not present

## 2018-01-03 DIAGNOSIS — F3340 Major depressive disorder, recurrent, in remission, unspecified: Secondary | ICD-10-CM | POA: Diagnosis not present

## 2018-01-03 DIAGNOSIS — L718 Other rosacea: Secondary | ICD-10-CM | POA: Diagnosis not present

## 2018-01-03 DIAGNOSIS — Z0000 Encounter for general adult medical examination without abnormal findings: Secondary | ICD-10-CM | POA: Diagnosis not present

## 2018-01-03 DIAGNOSIS — L570 Actinic keratosis: Secondary | ICD-10-CM | POA: Diagnosis not present

## 2018-01-03 DIAGNOSIS — Z6833 Body mass index (BMI) 33.0-33.9, adult: Secondary | ICD-10-CM | POA: Diagnosis not present

## 2018-01-03 MED FILL — metroNIDAZOLE 0.75 % CREA: 0.75 | 30 days supply | Qty: 45 | Fill #0

## 2018-01-11 MED FILL — traZODone HCL 100 MG TABS: 100 | 30 days supply | Qty: 30 | Fill #0

## 2018-01-11 MED FILL — AIMOVIG 140 MG/ML SOAJ: 140 | 30 days supply | Qty: 1 | Fill #1

## 2018-01-11 MED FILL — FLUOROURACIL 5% CREAM: 5 | 14 days supply | Qty: 40 | Fill #0

## 2018-01-18 DIAGNOSIS — G43019 Migraine without aura, intractable, without status migrainosus: Secondary | ICD-10-CM | POA: Diagnosis not present

## 2018-01-18 DIAGNOSIS — M791 Myalgia, unspecified site: Secondary | ICD-10-CM | POA: Diagnosis not present

## 2018-01-18 DIAGNOSIS — M542 Cervicalgia: Secondary | ICD-10-CM | POA: Diagnosis not present

## 2018-01-18 DIAGNOSIS — R51 Headache: Secondary | ICD-10-CM | POA: Diagnosis not present

## 2018-01-18 DIAGNOSIS — G43839 Menstrual migraine, intractable, without status migrainosus: Secondary | ICD-10-CM | POA: Diagnosis not present

## 2018-01-18 DIAGNOSIS — G43109 Migraine with aura, not intractable, without status migrainosus: Secondary | ICD-10-CM | POA: Diagnosis not present

## 2018-01-18 DIAGNOSIS — G43719 Chronic migraine without aura, intractable, without status migrainosus: Secondary | ICD-10-CM | POA: Diagnosis not present

## 2018-01-23 MED FILL — ZOMIG 5 MG NASAL SPRAY: 5 | 30 days supply | Qty: 6 | Fill #0

## 2018-03-06 DIAGNOSIS — M791 Myalgia, unspecified site: Secondary | ICD-10-CM | POA: Diagnosis not present

## 2018-03-06 DIAGNOSIS — M542 Cervicalgia: Secondary | ICD-10-CM | POA: Diagnosis not present

## 2018-03-06 DIAGNOSIS — G43719 Chronic migraine without aura, intractable, without status migrainosus: Secondary | ICD-10-CM | POA: Diagnosis not present

## 2018-03-06 DIAGNOSIS — G43839 Menstrual migraine, intractable, without status migrainosus: Secondary | ICD-10-CM | POA: Diagnosis not present

## 2018-03-06 DIAGNOSIS — G518 Other disorders of facial nerve: Secondary | ICD-10-CM | POA: Diagnosis not present

## 2018-03-06 DIAGNOSIS — G43109 Migraine with aura, not intractable, without status migrainosus: Secondary | ICD-10-CM | POA: Diagnosis not present

## 2018-03-06 DIAGNOSIS — G43019 Migraine without aura, intractable, without status migrainosus: Secondary | ICD-10-CM | POA: Diagnosis not present

## 2018-04-16 DIAGNOSIS — G518 Other disorders of facial nerve: Secondary | ICD-10-CM | POA: Diagnosis not present

## 2018-04-16 DIAGNOSIS — G43109 Migraine with aura, not intractable, without status migrainosus: Secondary | ICD-10-CM | POA: Diagnosis not present

## 2018-04-16 DIAGNOSIS — M542 Cervicalgia: Secondary | ICD-10-CM | POA: Diagnosis not present

## 2018-04-16 DIAGNOSIS — G43719 Chronic migraine without aura, intractable, without status migrainosus: Secondary | ICD-10-CM | POA: Diagnosis not present

## 2018-04-16 DIAGNOSIS — G43839 Menstrual migraine, intractable, without status migrainosus: Secondary | ICD-10-CM | POA: Diagnosis not present

## 2018-04-16 DIAGNOSIS — M791 Myalgia, unspecified site: Secondary | ICD-10-CM | POA: Diagnosis not present

## 2018-04-17 ENCOUNTER — Ambulatory Visit (INDEPENDENT_AMBULATORY_CARE_PROVIDER_SITE_OTHER): Payer: BLUE CROSS/BLUE SHIELD | Admitting: Psychiatry

## 2018-04-17 ENCOUNTER — Other Ambulatory Visit: Payer: Self-pay

## 2018-04-17 DIAGNOSIS — F419 Anxiety disorder, unspecified: Secondary | ICD-10-CM | POA: Diagnosis not present

## 2018-04-17 DIAGNOSIS — F330 Major depressive disorder, recurrent, mild: Secondary | ICD-10-CM

## 2018-04-17 MED ORDER — ESCITALOPRAM OXALATE 20 MG PO TABS: 20.0000 mg | ORAL_TABLET | Freq: Every day | ORAL | 0 refills | Status: DC

## 2018-04-17 MED ORDER — TRAZODONE HCL 100 MG PO TABS: ORAL_TABLET | ORAL | 0 refills | Status: DC

## 2018-04-17 MED ORDER — LORAZEPAM 0.5 MG PO TABS: 0.5000 mg | ORAL_TABLET | Freq: Every day | ORAL | 0 refills | Status: DC | PRN

## 2018-04-17 NOTE — Progress Notes (Signed)
Virtual Visit via Video Note  I connected with [name] on 04/17/18 at  4:20 PM EDT by a video enabled telemedicine application and verified that I am speaking with the correct person using two identifiers.   I discussed the limitations of evaluation and management by telemedicine and the availability of in person appointments. The patient expressed understanding and agreed to proceed.  History of Present Illness: Patient was evaluated through video WebEx.  She endorsed lately increased anxiety and nervousness because of pandemic coronavirus.  She is not going to work for past 2 weeks because all the elective surgeries are postponed.  However she tried to walk and run every day which helps her energy level and motivated.  Today she mentioned that she is going through divorce and currently separated from her husband for past 6 months.  She has been married to her husband for 10 years but marriage fall apart.  Patient told it was her decision but she is pleased that it is going smoothly and there has been no issues.  Patient told we are in good terms and like to keep taking medication.  Patient admitted is been difficult and hard but she feel it is best decision for both people.  She is sleeping okay.  She is taking trazodone.  She admitted sometimes takes lorazepam to help with anxiety from her mother.  She is not drinking or using any illegal substances.  She lives with her friend who is also a therapist.  Patient told that she is good supportive network.  Her mother lives 5 minutes away.  She denies any hallucination, paranoia or any suicidal thoughts.  She denies any feeling of hopelessness or worthlessness.  She like to continue Lexapro and trazodone.   Mental status examination: Limited mental status examination done as patient was not seen face-to-face.  Patient appears to be in general health and does not appear to be in distress.  She is smiling and maintained good eye contact.  Her speech  is normal, clear and coherent.  Her thought process appears to be logical goal-directed and there were no flight of ideas or any loose association.  She denies any paranoia, grandiosity or any delusions.  She denies any suicidal thoughts or homicidal thought.  Her attention and concentration appears to be normal.  She is alert and oriented x3.  Her insight judgment and impulse control is okay.  Assessment and Plan: Major depressive disorder, recurrent.  Anxiety.  Insomnia.  Patient is taking her medication.  Recommended not to take others medication especially benzodiazepine.  She apologized and promised not to do it again.  She only take few times when she could not sleep.  Encouraged to continue walk and run and keep a social distance due to pandemic coronavirus.  Patient is hoping once she resume her work her anxiety will get better.  I will add low-dose lorazepam 0.5 mg to take as needed for severe anxiety.  Continue Lexapro 20 mg and trazodone 50 mg at bedtime.  I also recommend to consider therapy.  Patient used to see Lisette Abu for therapy and she agreed if symptoms continue to get worse then she will contact to resume therapy.  Discussed safety concern that anytime having active suicidal thoughts or homicidal thought then she need to call 911 or go to local emergency room.  Follow-up in 3 months.  Follow Up Instructions:    I discussed the assessment and treatment plan with the patient. The patient was provided an  opportunity to ask questions and all were answered. The patient agreed with the plan and demonstrated an understanding of the instructions.   The patient was advised to call back or seek an in-person evaluation if the symptoms worsen or if the condition fails to improve as anticipated.  I provided 15 minutes of non-face-to-face time during this encounter.   Kathlee Nations, MD

## 2018-05-16 DIAGNOSIS — F4323 Adjustment disorder with mixed anxiety and depressed mood: Secondary | ICD-10-CM | POA: Diagnosis not present

## 2018-05-21 DIAGNOSIS — F4323 Adjustment disorder with mixed anxiety and depressed mood: Secondary | ICD-10-CM | POA: Diagnosis not present

## 2018-05-28 DIAGNOSIS — G43019 Migraine without aura, intractable, without status migrainosus: Secondary | ICD-10-CM | POA: Diagnosis not present

## 2018-05-28 DIAGNOSIS — G43719 Chronic migraine without aura, intractable, without status migrainosus: Secondary | ICD-10-CM | POA: Diagnosis not present

## 2018-05-28 DIAGNOSIS — M791 Myalgia, unspecified site: Secondary | ICD-10-CM | POA: Diagnosis not present

## 2018-05-28 DIAGNOSIS — M542 Cervicalgia: Secondary | ICD-10-CM | POA: Diagnosis not present

## 2018-05-28 DIAGNOSIS — F4323 Adjustment disorder with mixed anxiety and depressed mood: Secondary | ICD-10-CM | POA: Diagnosis not present

## 2018-05-28 DIAGNOSIS — G518 Other disorders of facial nerve: Secondary | ICD-10-CM | POA: Diagnosis not present

## 2018-06-04 DIAGNOSIS — F4323 Adjustment disorder with mixed anxiety and depressed mood: Secondary | ICD-10-CM | POA: Diagnosis not present

## 2018-06-27 DIAGNOSIS — F4323 Adjustment disorder with mixed anxiety and depressed mood: Secondary | ICD-10-CM | POA: Diagnosis not present

## 2018-07-09 DIAGNOSIS — G43109 Migraine with aura, not intractable, without status migrainosus: Secondary | ICD-10-CM | POA: Diagnosis not present

## 2018-07-09 DIAGNOSIS — M542 Cervicalgia: Secondary | ICD-10-CM | POA: Diagnosis not present

## 2018-07-09 DIAGNOSIS — G518 Other disorders of facial nerve: Secondary | ICD-10-CM | POA: Diagnosis not present

## 2018-07-09 DIAGNOSIS — M791 Myalgia, unspecified site: Secondary | ICD-10-CM | POA: Diagnosis not present

## 2018-07-09 DIAGNOSIS — G43019 Migraine without aura, intractable, without status migrainosus: Secondary | ICD-10-CM | POA: Diagnosis not present

## 2018-07-09 DIAGNOSIS — G43839 Menstrual migraine, intractable, without status migrainosus: Secondary | ICD-10-CM | POA: Diagnosis not present

## 2018-07-09 DIAGNOSIS — G43719 Chronic migraine without aura, intractable, without status migrainosus: Secondary | ICD-10-CM | POA: Diagnosis not present

## 2018-07-11 ENCOUNTER — Other Ambulatory Visit (HOSPITAL_COMMUNITY): Payer: Self-pay | Admitting: Psychiatry

## 2018-07-11 DIAGNOSIS — F330 Major depressive disorder, recurrent, mild: Secondary | ICD-10-CM

## 2018-07-18 ENCOUNTER — Other Ambulatory Visit (HOSPITAL_COMMUNITY): Payer: Self-pay | Admitting: Psychiatry

## 2018-07-18 DIAGNOSIS — F330 Major depressive disorder, recurrent, mild: Secondary | ICD-10-CM

## 2018-07-28 DIAGNOSIS — N6310 Unspecified lump in the right breast, unspecified quadrant: Secondary | ICD-10-CM | POA: Diagnosis not present

## 2018-07-28 DIAGNOSIS — Z20828 Contact with and (suspected) exposure to other viral communicable diseases: Secondary | ICD-10-CM | POA: Diagnosis not present

## 2018-07-30 ENCOUNTER — Other Ambulatory Visit: Payer: Self-pay

## 2018-07-30 ENCOUNTER — Telehealth: Payer: Self-pay | Admitting: *Deleted

## 2018-07-30 ENCOUNTER — Encounter: Payer: Self-pay | Admitting: Gynecology

## 2018-07-30 ENCOUNTER — Ambulatory Visit (INDEPENDENT_AMBULATORY_CARE_PROVIDER_SITE_OTHER): Payer: BC Managed Care – PPO | Admitting: Gynecology

## 2018-07-30 VITALS — BP 120/78

## 2018-07-30 DIAGNOSIS — N63 Unspecified lump in unspecified breast: Secondary | ICD-10-CM

## 2018-07-30 NOTE — Telephone Encounter (Signed)
-----   Message from Anastasio Auerbach, MD sent at 07/30/2018  2:21 PM EDT ----- Arrange at the breast center diagnostic mammography on the right and ultrasound.  Recent chest x-ray showed 1.5 cm breast "mass".  Not palpable on physical exam by the patient or physician

## 2018-07-30 NOTE — Progress Notes (Signed)
    LYLAH LANTIS 1975-08-26 [id number]        43 y.o.  G0P0 presents having had a chest x-ray where they noted a right breast mass at 1.5 cm.  Patient had no breast complaints.  She is unable to feel any abnormalities in her right breast.  She is status post reduction with scar tissue.  Normal mammography in December.  Past medical history,surgical history, problem list, medications, allergies, family history and social history were all reviewed and documented in the EPIC chart.  Directed ROS with pertinent positives and negatives documented in the history of present illness/assessment and plan.  Exam: Caryn Bee assistant Vitals:   07/30/18 1410  BP: 120/78   General appearance:  Normal Both breasts examined lying and sitting without masses, retractions, discharge, adenopathy  Assessment/Plan:  43 y.o. G0P0 with chest x-ray reporting right breast mass.  Not palpable by patient or physician.  Differential reviewed to include nipple shadow, scar tissue, cyst, solid mass.  She is status post reduction with scars which may complicate exam.  Recommend diagnostic mammography and ultrasound on the right.  Patient will arrange through the breast center.   Anastasio Auerbach MD, 2:23 PM 07/30/2018

## 2018-07-30 NOTE — Patient Instructions (Signed)
The breast center should call to arrange for the mammogram and ultrasound

## 2018-07-30 NOTE — Telephone Encounter (Signed)
Patient scheduled on 08/13/18 @10 :50am at breast center, patient informed aware to have x-ray disk sent to the breast center.

## 2018-08-13 ENCOUNTER — Other Ambulatory Visit: Payer: Self-pay

## 2018-08-13 ENCOUNTER — Ambulatory Visit
Admission: RE | Admit: 2018-08-13 | Discharge: 2018-08-13 | Disposition: A | Discharge status: Home / Self Care | Payer: BC Managed Care – PPO | Source: Ambulatory Visit | Attending: Gynecology | Admitting: Gynecology

## 2018-08-13 ENCOUNTER — Ambulatory Visit: Payer: Self-pay

## 2018-08-13 DIAGNOSIS — N63 Unspecified lump in unspecified breast: Secondary | ICD-10-CM

## 2018-08-13 DIAGNOSIS — R928 Other abnormal and inconclusive findings on diagnostic imaging of breast: Secondary | ICD-10-CM | POA: Diagnosis not present

## 2018-08-21 DIAGNOSIS — G43719 Chronic migraine without aura, intractable, without status migrainosus: Secondary | ICD-10-CM | POA: Diagnosis not present

## 2018-08-21 DIAGNOSIS — M542 Cervicalgia: Secondary | ICD-10-CM | POA: Diagnosis not present

## 2018-08-21 DIAGNOSIS — M791 Myalgia, unspecified site: Secondary | ICD-10-CM | POA: Diagnosis not present

## 2018-08-21 DIAGNOSIS — G43019 Migraine without aura, intractable, without status migrainosus: Secondary | ICD-10-CM | POA: Diagnosis not present

## 2018-08-21 DIAGNOSIS — G43839 Menstrual migraine, intractable, without status migrainosus: Secondary | ICD-10-CM | POA: Diagnosis not present

## 2018-08-21 DIAGNOSIS — G518 Other disorders of facial nerve: Secondary | ICD-10-CM | POA: Diagnosis not present

## 2018-09-26 ENCOUNTER — Other Ambulatory Visit (HOSPITAL_COMMUNITY): Payer: Self-pay | Admitting: Psychiatry

## 2018-09-26 DIAGNOSIS — F330 Major depressive disorder, recurrent, mild: Secondary | ICD-10-CM

## 2018-10-02 DIAGNOSIS — M791 Myalgia, unspecified site: Secondary | ICD-10-CM | POA: Diagnosis not present

## 2018-10-02 DIAGNOSIS — G518 Other disorders of facial nerve: Secondary | ICD-10-CM | POA: Diagnosis not present

## 2018-10-02 DIAGNOSIS — G43019 Migraine without aura, intractable, without status migrainosus: Secondary | ICD-10-CM | POA: Diagnosis not present

## 2018-10-02 DIAGNOSIS — M542 Cervicalgia: Secondary | ICD-10-CM | POA: Diagnosis not present

## 2018-10-02 DIAGNOSIS — G43839 Menstrual migraine, intractable, without status migrainosus: Secondary | ICD-10-CM | POA: Diagnosis not present

## 2018-10-02 DIAGNOSIS — G43719 Chronic migraine without aura, intractable, without status migrainosus: Secondary | ICD-10-CM | POA: Diagnosis not present

## 2018-10-16 ENCOUNTER — Encounter: Payer: 59 | Admitting: Gynecology

## 2018-10-16 ENCOUNTER — Encounter: Payer: Self-pay | Admitting: Gynecology

## 2018-10-18 ENCOUNTER — Other Ambulatory Visit (HOSPITAL_COMMUNITY): Payer: Self-pay | Admitting: Psychiatry

## 2018-10-18 DIAGNOSIS — F330 Major depressive disorder, recurrent, mild: Secondary | ICD-10-CM

## 2018-11-01 ENCOUNTER — Other Ambulatory Visit (HOSPITAL_COMMUNITY): Payer: Self-pay

## 2018-11-01 DIAGNOSIS — F330 Major depressive disorder, recurrent, mild: Secondary | ICD-10-CM

## 2018-11-01 MED ORDER — ESCITALOPRAM OXALATE 20 MG PO TABS: 20.0000 mg | ORAL_TABLET | Freq: Every day | ORAL | 0 refills | Status: DC

## 2018-11-13 ENCOUNTER — Encounter: Payer: Self-pay | Admitting: Gynecology

## 2018-11-13 ENCOUNTER — Other Ambulatory Visit: Payer: Self-pay

## 2018-11-13 ENCOUNTER — Ambulatory Visit (INDEPENDENT_AMBULATORY_CARE_PROVIDER_SITE_OTHER): Payer: BC Managed Care – PPO | Admitting: Gynecology

## 2018-11-13 VITALS — BP 130/82 | Ht 68.0 in | Wt 220.0 lb

## 2018-11-13 DIAGNOSIS — G43719 Chronic migraine without aura, intractable, without status migrainosus: Secondary | ICD-10-CM | POA: Diagnosis not present

## 2018-11-13 DIAGNOSIS — Z01419 Encounter for gynecological examination (general) (routine) without abnormal findings: Secondary | ICD-10-CM

## 2018-11-13 DIAGNOSIS — G43019 Migraine without aura, intractable, without status migrainosus: Secondary | ICD-10-CM | POA: Diagnosis not present

## 2018-11-13 DIAGNOSIS — G43839 Menstrual migraine, intractable, without status migrainosus: Secondary | ICD-10-CM | POA: Diagnosis not present

## 2018-11-13 DIAGNOSIS — M791 Myalgia, unspecified site: Secondary | ICD-10-CM | POA: Diagnosis not present

## 2018-11-13 DIAGNOSIS — Z1151 Encounter for screening for human papillomavirus (HPV): Secondary | ICD-10-CM | POA: Diagnosis not present

## 2018-11-13 DIAGNOSIS — M542 Cervicalgia: Secondary | ICD-10-CM | POA: Diagnosis not present

## 2018-11-13 DIAGNOSIS — G518 Other disorders of facial nerve: Secondary | ICD-10-CM | POA: Diagnosis not present

## 2018-11-13 DIAGNOSIS — N898 Other specified noninflammatory disorders of vagina: Secondary | ICD-10-CM | POA: Diagnosis not present

## 2018-11-13 LAB — WET PREP FOR TRICH, YEAST, CLUE

## 2018-11-13 MED ORDER — DASETTA 1/35 (28) 1-35 MG-MCG PO TABS: ORAL_TABLET | ORAL | 4 refills | Status: DC

## 2018-11-13 NOTE — Progress Notes (Signed)
    [name] [facility] [birth year] [id number]        43 y.o.  G0P0 for annual gynecologic exam.  Notes some vulvar irritation.  She does have a history of intermittent irritation for which she uses Temovate 0.05% cream.  She notes no discharge or odor.  Past medical history,surgical history, problem list, medications, allergies, family history and social history were all reviewed and documented as reviewed in the EPIC chart.  ROS:  Performed with pertinent positives and negatives included in the history, assessment and plan.   Additional significant findings : None   Exam: [name] assistant Vitals:   11/13/18 1429  BP: 130/82  Weight: 220 lb (99.8 kg)  Height: 5\' 8"  (1.727 m)   Body mass index is 33.45 kg/m.  General appearance:  Normal affect, orientation and appearance. Skin: Grossly normal HEENT: Without gross lesions.  No cervical or supraclavicular adenopathy. Thyroid normal.  Lungs:  Clear without wheezing, rales or rhonchi Cardiac: RR, without RMG Abdominal:  Soft, nontender, without masses, guarding, rebound, organomegaly or hernia Breasts:  Examined lying and sitting without masses, retractions, discharge or axillary adenopathy.  Well-healed bilateral reduction scars Pelvic:  Ext, BUS, Vagina: Normal with slight white discharge.  No significant vulvar irritation or abnormalities noted.  Cervix: Normal.  Pap smear/HPV  Uterus: Anteverted, normal size, shape and contour, midline and mobile nontender   Adnexa: Without masses or tenderness    Anus and perineum: Normal   Rectovaginal: Normal sphincter tone without palpated masses or tenderness.    Assessment/Plan:  43 y.o. G0P0 female for annual gynecologic exam.  Without menses on continuous oral contraceptives  1. Vulvar irritation.  Wet prep is negative.  We will continue with clobetasol 0.05% cream as needed.  Patient has supply but will call when she needs more. 2. Continuous oral contraceptives.  Patient  doing well and wants to continue.  We have discussed the risks of thrombosis.  Refill x1 year provided. 3. Mammography coming due in December and I reminded her to schedule this.  Had recent mammogram in July due to mass-effect seen on chest x-ray which turned out to be dystrophic calcifications.  She will follow-up for her screening bilateral mammography in December.  Breast exam normal today. 4. Pap smear/HPV 08/2014.  Pap smear/HPV today.  History of ASCUS Pap smears with negative HPV in the past but normal follow-up Pap smears. 5. Health maintenance.  No routine lab work done as patient does this elsewhere.  Follow-up 1 year, sooner as needed.   Anastasio Auerbach MD, 2:48 PM 11/13/2018

## 2018-11-13 NOTE — Patient Instructions (Signed)
Apply the clobetasol 0.05% cream as needed for irritation.  Follow-up in 1 year for annual exam.

## 2018-11-14 LAB — PAP IG AND HPV HIGH-RISK: HPV DNA High Risk: NOT DETECTED

## 2018-11-15 ENCOUNTER — Encounter: Payer: Self-pay | Admitting: Gynecology

## 2018-11-26 ENCOUNTER — Other Ambulatory Visit (HOSPITAL_COMMUNITY): Payer: Self-pay | Admitting: Psychiatry

## 2018-11-26 DIAGNOSIS — F330 Major depressive disorder, recurrent, mild: Secondary | ICD-10-CM

## 2018-11-28 ENCOUNTER — Other Ambulatory Visit (HOSPITAL_COMMUNITY): Payer: Self-pay

## 2018-11-28 DIAGNOSIS — F330 Major depressive disorder, recurrent, mild: Secondary | ICD-10-CM

## 2018-11-28 MED ORDER — ESCITALOPRAM OXALATE 20 MG PO TABS: 20.0000 mg | ORAL_TABLET | Freq: Every day | ORAL | 0 refills | Status: DC

## 2018-11-28 MED ORDER — TRAZODONE HCL 100 MG PO TABS: ORAL_TABLET | ORAL | 0 refills | Status: DC

## 2018-12-23 ENCOUNTER — Encounter: Payer: Self-pay | Admitting: Gynecology

## 2018-12-24 ENCOUNTER — Other Ambulatory Visit (HOSPITAL_COMMUNITY): Payer: Self-pay | Admitting: Psychiatry

## 2018-12-24 DIAGNOSIS — F330 Major depressive disorder, recurrent, mild: Secondary | ICD-10-CM

## 2018-12-24 MED ORDER — VALACYCLOVIR HCL 500 MG PO TABS: 500.0000 mg | ORAL_TABLET | Freq: Every day | ORAL | 4 refills | Status: DC

## 2018-12-24 NOTE — Telephone Encounter (Signed)
There is evidence that using Valtrex daily decreases transmission rates in discordant couples where 1 has HSV and the other does not.  No guarantee though as there can be transmission despite using the medication.  I always leave this when up to the patient as to what they feel most comfortable with and if she would prefer to start daily suppression then Valtrex 500 mg #90, 1 p.o. daily with 4 refills

## 2018-12-26 DIAGNOSIS — G43719 Chronic migraine without aura, intractable, without status migrainosus: Secondary | ICD-10-CM | POA: Diagnosis not present

## 2018-12-26 DIAGNOSIS — G518 Other disorders of facial nerve: Secondary | ICD-10-CM | POA: Diagnosis not present

## 2018-12-26 DIAGNOSIS — M791 Myalgia, unspecified site: Secondary | ICD-10-CM | POA: Diagnosis not present

## 2018-12-26 DIAGNOSIS — G43019 Migraine without aura, intractable, without status migrainosus: Secondary | ICD-10-CM | POA: Diagnosis not present

## 2018-12-26 DIAGNOSIS — M542 Cervicalgia: Secondary | ICD-10-CM | POA: Diagnosis not present

## 2018-12-27 ENCOUNTER — Other Ambulatory Visit (HOSPITAL_COMMUNITY): Payer: Self-pay | Admitting: Psychiatry

## 2018-12-27 ENCOUNTER — Encounter (HOSPITAL_COMMUNITY): Payer: Self-pay | Admitting: Psychiatry

## 2018-12-27 ENCOUNTER — Other Ambulatory Visit: Payer: Self-pay

## 2018-12-27 ENCOUNTER — Ambulatory Visit (INDEPENDENT_AMBULATORY_CARE_PROVIDER_SITE_OTHER): Payer: BC Managed Care – PPO | Admitting: Psychiatry

## 2018-12-27 VITALS — Wt 210.0 lb

## 2018-12-27 DIAGNOSIS — F330 Major depressive disorder, recurrent, mild: Secondary | ICD-10-CM

## 2018-12-27 DIAGNOSIS — F419 Anxiety disorder, unspecified: Secondary | ICD-10-CM

## 2018-12-27 MED ORDER — TRAZODONE HCL 100 MG PO TABS: ORAL_TABLET | ORAL | 0 refills | Status: DC

## 2018-12-27 MED ORDER — ESCITALOPRAM OXALATE 20 MG PO TABS: 20.0000 mg | ORAL_TABLET | Freq: Every day | ORAL | 0 refills | Status: DC

## 2018-12-27 NOTE — Progress Notes (Signed)
Virtual Visit via Telephone Note  I connected with [name] on 12/27/18 at  3:40 PM EST by telephone and verified that I am speaking with the correct person using two identifiers.   I discussed the limitations, risks, security and privacy concerns of performing an evaluation and management service by telephone and the availability of in person appointments. I also discussed with the patient that there may be a patient responsible charge related to this service. The patient expressed understanding and agreed to proceed.   History of Present Illness: Patient was evaluated by phone session.  She is doing well on her medication.  She has not taken lorazepam other than 2 times in the past 6 months.  She is working 40 hours a week and keeping herself busy.  She lost 10 pounds as she is more active and start walking and running but there has been no recent event due to Covid.  She is happy she bought her own house and she is able to make friends in the neighborhood.  Her mother lives close by.  Patient taking Lexapro and trazodone but does helping her anxiety and depression.  She is sleeping good.  She denies any crying spells or any feeling of hopelessness or worthlessness.  Her energy level is good.  She wants to continue current medication.   Past Psychiatric History: Viewed Patient has history of depression since her teens. In the past she had tried Paxil, Lunesta and briefly Wellbutrin.She had a good response with Cymbalta until it stopped working. She denies any history of suicidal attempt, psychiatric inpatient treatment, psychosis or any self abusive behavior.   Psychiatric Specialty Exam: Physical Exam  ROS  There were no vitals taken for this visit.There is no height or weight on file to calculate BMI.  General Appearance: NA  Eye Contact:  NA  Speech:  Clear and Coherent  Volume:  Normal  Mood:  Euthymic  Affect:  NA  Thought Process:  Goal Directed  Orientation:   Full (Time, Place, and Person)  Thought Content:  WDL and Logical  Suicidal Thoughts:  No  Homicidal Thoughts:  No  Memory:  Immediate;   Good Recent;   Good Remote;   Good  Judgement:  Good  Insight:  Good  Psychomotor Activity:  NA  Concentration:  Concentration: Good and Attention Span: Good  Recall:  Good  Fund of Knowledge:  Good  Language:  Good  Akathisia:  No  Handed:  Right  AIMS (if indicated):     Assets:  Communication Skills Desire for Improvement Financial Resources/Insurance Housing Physical Health Resilience Talents/Skills Transportation  ADL's:  Intact  Cognition:  WNL  Sleep:   ok      Assessment and Plan: Major depressive disorder, recurrent.  Anxiety.  Patient doing well on her current medication.  Continue Lexapro 20 mg daily and trazodone 100 mg half to 1 tablet as needed for insomnia.  Discussed medication side effects and benefits.  Recommended to call us back if she has any question or any concern.  Follow-up in 3 months.  She is seeing therapist Rosemarie Ax every 6 weeks  Follow Up Instructions:    I discussed the assessment and treatment plan with the patient. The patient was provided an opportunity to ask questions and all were answered. The patient agreed with the plan and demonstrated an understanding of the instructions.   The patient was advised to call back or seek an in-person evaluation if the symptoms worsen or if  the condition fails to improve as anticipated.  I provided 20 minutes of non-face-to-face time during this encounter.   Kathlee Nations, MD

## 2019-01-08 DIAGNOSIS — E7849 Other hyperlipidemia: Secondary | ICD-10-CM | POA: Diagnosis not present

## 2019-01-08 DIAGNOSIS — Z0000 Encounter for general adult medical examination without abnormal findings: Secondary | ICD-10-CM | POA: Diagnosis not present

## 2019-01-08 DIAGNOSIS — D225 Melanocytic nevi of trunk: Secondary | ICD-10-CM | POA: Diagnosis not present

## 2019-01-08 DIAGNOSIS — E785 Hyperlipidemia, unspecified: Secondary | ICD-10-CM | POA: Diagnosis not present

## 2019-01-08 DIAGNOSIS — D485 Neoplasm of uncertain behavior of skin: Secondary | ICD-10-CM | POA: Diagnosis not present

## 2019-01-08 DIAGNOSIS — N1831 Chronic kidney disease, stage 3a: Secondary | ICD-10-CM | POA: Diagnosis not present

## 2019-01-08 DIAGNOSIS — Z1331 Encounter for screening for depression: Secondary | ICD-10-CM | POA: Diagnosis not present

## 2019-01-08 DIAGNOSIS — F3340 Major depressive disorder, recurrent, in remission, unspecified: Secondary | ICD-10-CM | POA: Diagnosis not present

## 2019-01-08 DIAGNOSIS — L821 Other seborrheic keratosis: Secondary | ICD-10-CM | POA: Diagnosis not present

## 2019-01-08 DIAGNOSIS — B079 Viral wart, unspecified: Secondary | ICD-10-CM | POA: Diagnosis not present

## 2019-01-08 DIAGNOSIS — G43909 Migraine, unspecified, not intractable, without status migrainosus: Secondary | ICD-10-CM | POA: Diagnosis not present

## 2019-02-11 DIAGNOSIS — G43019 Migraine without aura, intractable, without status migrainosus: Secondary | ICD-10-CM | POA: Diagnosis not present

## 2019-02-11 DIAGNOSIS — G43719 Chronic migraine without aura, intractable, without status migrainosus: Secondary | ICD-10-CM | POA: Diagnosis not present

## 2019-02-11 DIAGNOSIS — M791 Myalgia, unspecified site: Secondary | ICD-10-CM | POA: Diagnosis not present

## 2019-02-11 DIAGNOSIS — G43839 Menstrual migraine, intractable, without status migrainosus: Secondary | ICD-10-CM | POA: Diagnosis not present

## 2019-02-11 DIAGNOSIS — G518 Other disorders of facial nerve: Secondary | ICD-10-CM | POA: Diagnosis not present

## 2019-02-11 DIAGNOSIS — M542 Cervicalgia: Secondary | ICD-10-CM | POA: Diagnosis not present

## 2019-03-22 ENCOUNTER — Other Ambulatory Visit (HOSPITAL_COMMUNITY): Payer: Self-pay | Admitting: Psychiatry

## 2019-03-22 DIAGNOSIS — F330 Major depressive disorder, recurrent, mild: Secondary | ICD-10-CM

## 2019-03-22 DIAGNOSIS — F419 Anxiety disorder, unspecified: Secondary | ICD-10-CM

## 2019-03-26 ENCOUNTER — Ambulatory Visit (INDEPENDENT_AMBULATORY_CARE_PROVIDER_SITE_OTHER): Payer: BC Managed Care – PPO | Admitting: Psychiatry

## 2019-03-26 ENCOUNTER — Encounter (HOSPITAL_COMMUNITY): Payer: Self-pay | Admitting: Psychiatry

## 2019-03-26 ENCOUNTER — Other Ambulatory Visit: Payer: Self-pay

## 2019-03-26 DIAGNOSIS — F419 Anxiety disorder, unspecified: Secondary | ICD-10-CM | POA: Diagnosis not present

## 2019-03-26 DIAGNOSIS — F330 Major depressive disorder, recurrent, mild: Secondary | ICD-10-CM

## 2019-03-26 MED ORDER — TRAZODONE HCL 100 MG PO TABS: ORAL_TABLET | ORAL | 0 refills | Status: DC

## 2019-03-26 MED ORDER — ESCITALOPRAM OXALATE 20 MG PO TABS: 20.0000 mg | ORAL_TABLET | Freq: Every day | ORAL | 0 refills | Status: DC

## 2019-03-26 NOTE — Progress Notes (Signed)
Virtual Visit via Telephone Note  I connected with [name] on 03/26/19 at  4:00 PM EST by telephone and verified that I am speaking with the correct person using two identifiers.   I discussed the limitations, risks, security and privacy concerns of performing an evaluation and management service by telephone and the availability of in person appointments. I also discussed with the patient that there may be a patient responsible charge related to this service. The patient expressed understanding and agreed to proceed.   History of Present Illness: Patient was evaluated by phone session.  She is a stable on her medication.  She is very busy at work and sometimes she is very tired and has no motivation to go to the gym but recently she started enrolled herself in tennis team and she is hoping to play tennis.  She is working full-time.  She reported holidays were difficult but she is glad it is over.  Her parents lives close by who are very supportive.  She does not want to change medication since it is working very well.  Her energy level is good with the medication.  She has no tremors, shakes or any EPS.  She denies any crying spells or any feeling of hopelessness of hopelessness.    Past Psychiatric History:Viewed H/O depression since teens. Tried Paxil, Lunesta and briefly Wellbutrin.Good response with Cymbalta until stopped working. No h/o suicidal attempt, psychiatric inpatient treatment, psychosis or self abusive behavior.  Psychiatric Specialty Exam: Physical Exam  Review of Systems  There were no vitals taken for this visit.There is no height or weight on file to calculate BMI.  General Appearance: NA  Eye Contact:  NA  Speech:  Clear and Coherent  Volume:  Normal  Mood:  Euthymic  Affect:  NA  Thought Process:  Goal Directed  Orientation:  Full (Time, Place, and Person)  Thought Content:  WDL and Logical  Suicidal Thoughts:  No  Homicidal Thoughts:  No   Memory:  Immediate;   Good Recent;   Good Remote;   Good  Judgement:  Good  Insight:  Good  Psychomotor Activity:  NA  Concentration:  Concentration: Good and Attention Span: Good  Recall:  Good  Fund of Knowledge:  Good  Language:  Good  Akathisia:  No  Handed:  Right  AIMS (if indicated):     Assets:  Communication Skills Desire for Improvement Housing Resilience Social Support Talents/Skills Transportation  ADL's:  Intact  Cognition:  WNL  Sleep:   good      Assessment and Plan: Major depressive disorder, recurrent.  Anxiety.  Patient is a stable on her current medication.  Continue Lexapro 20 mg daily and trazodone 50-100 mg as needed for insomnia.  Discussed medication side effects and benefits.  Recommended to call us back if she has any question of any concern.  Follow-up in 3 months.  Follow Up Instructions:    I discussed the assessment and treatment plan with the patient. The patient was provided an opportunity to ask questions and all were answered. The patient agreed with the plan and demonstrated an understanding of the instructions.   The patient was advised to call back or seek an in-person evaluation if the symptoms worsen or if the condition fails to improve as anticipated.  I provided 20 minutes of non-face-to-face time during this encounter.   Kathlee Nations, MD

## 2019-04-09 DIAGNOSIS — G43839 Menstrual migraine, intractable, without status migrainosus: Secondary | ICD-10-CM | POA: Diagnosis not present

## 2019-04-09 DIAGNOSIS — M791 Myalgia, unspecified site: Secondary | ICD-10-CM | POA: Diagnosis not present

## 2019-04-09 DIAGNOSIS — G501 Atypical facial pain: Secondary | ICD-10-CM | POA: Diagnosis not present

## 2019-04-09 DIAGNOSIS — M542 Cervicalgia: Secondary | ICD-10-CM | POA: Diagnosis not present

## 2019-04-09 DIAGNOSIS — G518 Other disorders of facial nerve: Secondary | ICD-10-CM | POA: Diagnosis not present

## 2019-04-09 DIAGNOSIS — G43019 Migraine without aura, intractable, without status migrainosus: Secondary | ICD-10-CM | POA: Diagnosis not present

## 2019-04-09 DIAGNOSIS — G43719 Chronic migraine without aura, intractable, without status migrainosus: Secondary | ICD-10-CM | POA: Diagnosis not present

## 2019-05-06 DIAGNOSIS — H539 Unspecified visual disturbance: Secondary | ICD-10-CM | POA: Diagnosis not present

## 2019-05-06 DIAGNOSIS — H35462 Secondary vitreoretinal degeneration, left eye: Secondary | ICD-10-CM | POA: Diagnosis not present

## 2019-05-06 DIAGNOSIS — H43821 Vitreomacular adhesion, right eye: Secondary | ICD-10-CM | POA: Diagnosis not present

## 2019-05-17 DIAGNOSIS — M542 Cervicalgia: Secondary | ICD-10-CM | POA: Diagnosis not present

## 2019-05-17 DIAGNOSIS — G43719 Chronic migraine without aura, intractable, without status migrainosus: Secondary | ICD-10-CM | POA: Diagnosis not present

## 2019-05-17 DIAGNOSIS — G43839 Menstrual migraine, intractable, without status migrainosus: Secondary | ICD-10-CM | POA: Diagnosis not present

## 2019-05-17 DIAGNOSIS — G518 Other disorders of facial nerve: Secondary | ICD-10-CM | POA: Diagnosis not present

## 2019-05-17 DIAGNOSIS — G43019 Migraine without aura, intractable, without status migrainosus: Secondary | ICD-10-CM | POA: Diagnosis not present

## 2019-05-17 DIAGNOSIS — M791 Myalgia, unspecified site: Secondary | ICD-10-CM | POA: Diagnosis not present

## 2019-06-18 DIAGNOSIS — G43019 Migraine without aura, intractable, without status migrainosus: Secondary | ICD-10-CM | POA: Diagnosis not present

## 2019-06-18 DIAGNOSIS — G43839 Menstrual migraine, intractable, without status migrainosus: Secondary | ICD-10-CM | POA: Diagnosis not present

## 2019-06-18 DIAGNOSIS — M791 Myalgia, unspecified site: Secondary | ICD-10-CM | POA: Diagnosis not present

## 2019-06-18 DIAGNOSIS — G518 Other disorders of facial nerve: Secondary | ICD-10-CM | POA: Diagnosis not present

## 2019-06-18 DIAGNOSIS — G43719 Chronic migraine without aura, intractable, without status migrainosus: Secondary | ICD-10-CM | POA: Diagnosis not present

## 2019-06-18 DIAGNOSIS — M542 Cervicalgia: Secondary | ICD-10-CM | POA: Diagnosis not present

## 2019-06-19 ENCOUNTER — Other Ambulatory Visit: Payer: Self-pay | Admitting: Specialist

## 2019-06-19 DIAGNOSIS — H5340 Unspecified visual field defects: Secondary | ICD-10-CM

## 2019-06-27 ENCOUNTER — Other Ambulatory Visit: Payer: Self-pay

## 2019-06-27 ENCOUNTER — Telehealth (INDEPENDENT_AMBULATORY_CARE_PROVIDER_SITE_OTHER): Payer: BC Managed Care – PPO | Admitting: Psychiatry

## 2019-06-27 ENCOUNTER — Encounter (HOSPITAL_COMMUNITY): Payer: Self-pay | Admitting: Psychiatry

## 2019-06-27 DIAGNOSIS — F330 Major depressive disorder, recurrent, mild: Secondary | ICD-10-CM

## 2019-06-27 DIAGNOSIS — F419 Anxiety disorder, unspecified: Secondary | ICD-10-CM | POA: Diagnosis not present

## 2019-06-27 MED ORDER — ESCITALOPRAM OXALATE 20 MG PO TABS: 20.0000 mg | ORAL_TABLET | Freq: Every day | ORAL | 0 refills | Status: DC

## 2019-06-27 NOTE — Progress Notes (Signed)
Virtual Visit via Telephone Note  I connected with [name] on 06/27/19 at  4:00 PM EDT by telephone and verified that I am speaking with the correct person using two identifiers.   I discussed the limitations, risks, security and privacy concerns of performing an evaluation and management service by telephone and the availability of in person appointments. I also discussed with the patient that there may be a patient responsible charge related to this service. The patient expressed understanding and agreed to proceed.  Patient Location: Home Provider Location: Home office  History of Present Illness: Patient is evaluated by phone session.  She has been experiencing depression and sadness.  She is not sure but she does go to the cycles of depression.  She endorsed lack of motivation to do things.  She is doing biking and cycling but since tennis ended she is not continued and summer.  She is sleeping okay.  She has been busy work and sometimes gets tired.  Though she denies any hallucination, paranoia, suicidal thoughts but endorsed sadness and fatigue.  Her parents live close by who are very supportive.  She is taking trazodone mostly 50 mg and her sleep is still.  She has no tremor shakes or any EPS.  She endorsed lately increased headaches and her physician recommended to trial Lamictal.  She has not started yet but like to discuss with me before start taking.  She feels the zonisamide is not working as much.  She is taking Lexapro which seems to be working okay but there are days when she is still struggling with depression.  Her appetite is okay.  She denies any mania, psychosis.  Past Psychiatric History:Viewed H/O depression since teens. Tried Paxil, Lunesta and briefly Wellbutrin.Good response with Cymbalta until stopped working. No h/o suicidal attempt, psychiatric inpatient treatment, psychosis or self abusive behavior.  Psychiatric Specialty Exam: Physical Exam  Review  of Systems  Neurological: Positive for headaches.    Weight 215 lb (97.5 kg).There is no height or weight on file to calculate BMI.  General Appearance: NA  Eye Contact:  NA  Speech:  Slow  Volume:  Decreased  Mood:  Depressed and Dysphoric  Affect:  NA  Thought Process:  Goal Directed  Orientation:  Full (Time, Place, and Person)  Thought Content:  Rumination  Suicidal Thoughts:  No  Homicidal Thoughts:  No  Memory:  Immediate;   Good Recent;   Good Remote;   Good  Judgement:  Good  Insight:  Good  Psychomotor Activity:  NA  Concentration:  Concentration: Fair and Attention Span: Fair  Recall:  Good  Fund of Knowledge:  Good  Language:  Good  Akathisia:  No  Handed:  Right  AIMS (if indicated):     Assets:  Communication Skills Desire for Improvement Housing Resilience Social Support Talents/Skills Transportation  ADL's:  Intact  Cognition:  WNL  Sleep:   ok      Assessment and Plan: Major depressive disorder, recurrent.  Anxiety.  I discussed her residual symptoms of depression.  Her neurologist like to try Lamictal and I agree because it can also help her residual depression and mood lability.  We will continue Lexapro 20 mg daily and she will start Lamictal from neurology to take 25 mg for 2 weeks and then 50 mg daily.  I reminded the side effects of Lamictal that she need to watch carefully about the rash.  She is hoping that we will take care of that depression  and headaches.  Continue trazodone 50-100 mg as needed for insomnia.  She does not need a new prescription.  I recommend to call us back if she has any question or any concern.  Follow-up in 3 months.  Follow Up Instructions:    I discussed the assessment and treatment plan with the patient. The patient was provided an opportunity to ask questions and all were answered. The patient agreed with the plan and demonstrated an understanding of the instructions.   The patient was advised to call back or seek  an in-person evaluation if the symptoms worsen or if the condition fails to improve as anticipated.  I provided 20 minutes of non-face-to-face time during this encounter.   Kathlee Nations, MD

## 2019-07-22 ENCOUNTER — Other Ambulatory Visit: Payer: Self-pay | Admitting: Specialist

## 2019-07-23 ENCOUNTER — Other Ambulatory Visit: Payer: Self-pay

## 2019-07-23 ENCOUNTER — Ambulatory Visit
Admission: RE | Admit: 2019-07-23 | Discharge: 2019-07-23 | Disposition: A | Discharge status: Home / Self Care | Payer: BC Managed Care – PPO | Source: Ambulatory Visit | Attending: Specialist | Admitting: Specialist

## 2019-07-23 DIAGNOSIS — H5340 Unspecified visual field defects: Secondary | ICD-10-CM

## 2019-07-23 DIAGNOSIS — I6782 Cerebral ischemia: Secondary | ICD-10-CM | POA: Diagnosis not present

## 2019-07-23 DIAGNOSIS — G43909 Migraine, unspecified, not intractable, without status migrainosus: Secondary | ICD-10-CM | POA: Diagnosis not present

## 2019-07-23 DIAGNOSIS — I6389 Other cerebral infarction: Secondary | ICD-10-CM | POA: Diagnosis not present

## 2019-07-23 DIAGNOSIS — J320 Chronic maxillary sinusitis: Secondary | ICD-10-CM | POA: Diagnosis not present

## 2019-07-23 MED ORDER — GADOBENATE DIMEGLUMINE 529 MG/ML IV SOLN
20.0000 mL | Freq: Once | INTRAVENOUS | Status: AC | PRN
  Administered 2019-07-23: 20 mL via INTRAVENOUS

## 2019-07-30 DIAGNOSIS — M542 Cervicalgia: Secondary | ICD-10-CM | POA: Diagnosis not present

## 2019-07-30 DIAGNOSIS — G43839 Menstrual migraine, intractable, without status migrainosus: Secondary | ICD-10-CM | POA: Diagnosis not present

## 2019-07-30 DIAGNOSIS — G43719 Chronic migraine without aura, intractable, without status migrainosus: Secondary | ICD-10-CM | POA: Diagnosis not present

## 2019-07-30 DIAGNOSIS — G518 Other disorders of facial nerve: Secondary | ICD-10-CM | POA: Diagnosis not present

## 2019-07-30 DIAGNOSIS — G43019 Migraine without aura, intractable, without status migrainosus: Secondary | ICD-10-CM | POA: Diagnosis not present

## 2019-07-30 DIAGNOSIS — M791 Myalgia, unspecified site: Secondary | ICD-10-CM | POA: Diagnosis not present

## 2019-09-10 DIAGNOSIS — M542 Cervicalgia: Secondary | ICD-10-CM | POA: Diagnosis not present

## 2019-09-10 DIAGNOSIS — M791 Myalgia, unspecified site: Secondary | ICD-10-CM | POA: Diagnosis not present

## 2019-09-10 DIAGNOSIS — G43719 Chronic migraine without aura, intractable, without status migrainosus: Secondary | ICD-10-CM | POA: Diagnosis not present

## 2019-09-10 DIAGNOSIS — G43019 Migraine without aura, intractable, without status migrainosus: Secondary | ICD-10-CM | POA: Diagnosis not present

## 2019-09-10 DIAGNOSIS — G43839 Menstrual migraine, intractable, without status migrainosus: Secondary | ICD-10-CM | POA: Diagnosis not present

## 2019-09-10 DIAGNOSIS — G518 Other disorders of facial nerve: Secondary | ICD-10-CM | POA: Diagnosis not present

## 2019-09-20 ENCOUNTER — Other Ambulatory Visit (HOSPITAL_COMMUNITY): Payer: Self-pay | Admitting: Psychiatry

## 2019-09-20 DIAGNOSIS — F330 Major depressive disorder, recurrent, mild: Secondary | ICD-10-CM

## 2019-09-20 DIAGNOSIS — F419 Anxiety disorder, unspecified: Secondary | ICD-10-CM

## 2019-09-21 ENCOUNTER — Other Ambulatory Visit (HOSPITAL_COMMUNITY): Payer: Self-pay | Admitting: Psychiatry

## 2019-09-21 DIAGNOSIS — F330 Major depressive disorder, recurrent, mild: Secondary | ICD-10-CM

## 2019-10-01 ENCOUNTER — Encounter (HOSPITAL_COMMUNITY): Payer: Self-pay | Admitting: Psychiatry

## 2019-10-01 ENCOUNTER — Other Ambulatory Visit: Payer: Self-pay

## 2019-10-01 ENCOUNTER — Telehealth (INDEPENDENT_AMBULATORY_CARE_PROVIDER_SITE_OTHER): Payer: BC Managed Care – PPO | Admitting: Psychiatry

## 2019-10-01 DIAGNOSIS — F419 Anxiety disorder, unspecified: Secondary | ICD-10-CM

## 2019-10-01 DIAGNOSIS — F330 Major depressive disorder, recurrent, mild: Secondary | ICD-10-CM | POA: Diagnosis not present

## 2019-10-01 MED ORDER — TRAZODONE HCL 100 MG PO TABS: ORAL_TABLET | ORAL | 0 refills | Status: DC

## 2019-10-01 MED ORDER — ESCITALOPRAM OXALATE 20 MG PO TABS: 20.0000 mg | ORAL_TABLET | Freq: Every day | ORAL | 1 refills | Status: DC

## 2019-10-01 NOTE — Progress Notes (Signed)
Virtual Visit via Telephone Note  I connected with [name] on 10/01/19 at  4:00 PM EDT by telephone and verified that I am speaking with the correct person using two identifiers.  Location: Patient: in car Provider: home office   I discussed the limitations, risks, security and privacy concerns of performing an evaluation and management service by telephone and the availability of in person appointments. I also discussed with the patient that there may be a patient responsible charge related to this service. The patient expressed understanding and agreed to proceed.   History of Present Illness: Patient is evaluated by phone session.  She is doing well on her medication.  She is taking Lamictal from the neurology for headaches and now she is taking 75 mg twice a day.  Patient noticed much improvement in her mood, headaches, and energy level.  So far she is tolerating her medication.  She is also taking Lexapro and trazodone mostly 50 mg that helps her sleep.  Her job is very busy.  But she like her job as she keep herself busy at work.  She enjoys biking, cycling and playing tennis.  She continues to visit her parents who live close by regularly.  She has no concern from the medication.  She is taking Lexapro is working very well.  She denies any feeling of hopelessness, crying spells or any suicidal thoughts.  She denies any panic attack.   Past Psychiatric History: H/Odepression since teens.TriedPaxil, Lunesta and briefly Wellbutrin.Goodresponse with Cymbalta until stopped working. No h/osuicidal attempt, psychiatric inpatient treatment, psychosis or self abusive behavior.  Psychiatric Specialty Exam: Physical Exam  Review of Systems  Weight 210 lb (95.3 kg).There is no height or weight on file to calculate BMI.  General Appearance: NA  Eye Contact:  NA  Speech:  Clear and Coherent  Volume:  Normal  Mood:  Euthymic  Affect:  NA  Thought Process:  Goal Directed   Orientation:  Full (Time, Place, and Person)  Thought Content:  WDL  Suicidal Thoughts:  No  Homicidal Thoughts:  No  Memory:  Immediate;   Good Recent;   Good Remote;   Good  Judgement:  Good  Insight:  Good  Psychomotor Activity:  NA  Concentration:  Concentration: Good and Attention Span: Good  Recall:  Good  Fund of Knowledge:  Good  Language:  Good  Akathisia:  No  Handed:  Right  AIMS (if indicated):     Assets:  Communication Skills Desire for Improvement Housing Resilience Social Support Talents/Skills Transportation  ADL's:  Intact  Cognition:  WNL  Sleep:   ok      Assessment and Plan: Major depressive disorder, recurrent.  Anxiety.  Patient doing well on Lexapro 20 mg and trazodone 50-100 mg at bedtime.  She is getting Lamictal from neurology and dose now 75 mg twice a day which is helping her headaches and she noticed improvement in her mood.  Discussed medication side effects and benefits.  Recommended to call us back if she is any question or any concern.  Follow-up in 6 months.  Follow Up Instructions:    I discussed the assessment and treatment plan with the patient. The patient was provided an opportunity to ask questions and all were answered. The patient agreed with the plan and demonstrated an understanding of the instructions.   The patient was advised to call back or seek an in-person evaluation if the symptoms worsen or if the condition fails to improve as anticipated.  I provided 14 minutes of non-face-to-face time during this encounter.   Kathlee Nations, MD

## 2019-10-22 DIAGNOSIS — M542 Cervicalgia: Secondary | ICD-10-CM | POA: Diagnosis not present

## 2019-10-22 DIAGNOSIS — M791 Myalgia, unspecified site: Secondary | ICD-10-CM | POA: Diagnosis not present

## 2019-10-22 DIAGNOSIS — G43019 Migraine without aura, intractable, without status migrainosus: Secondary | ICD-10-CM | POA: Diagnosis not present

## 2019-10-22 DIAGNOSIS — G518 Other disorders of facial nerve: Secondary | ICD-10-CM | POA: Diagnosis not present

## 2019-10-22 DIAGNOSIS — G43839 Menstrual migraine, intractable, without status migrainosus: Secondary | ICD-10-CM | POA: Diagnosis not present

## 2019-11-14 ENCOUNTER — Encounter: Payer: BC Managed Care – PPO | Admitting: Obstetrics and Gynecology

## 2019-11-14 ENCOUNTER — Encounter: Payer: BC Managed Care – PPO | Admitting: Gynecology

## 2019-11-22 ENCOUNTER — Ambulatory Visit (INDEPENDENT_AMBULATORY_CARE_PROVIDER_SITE_OTHER): Payer: BC Managed Care – PPO | Admitting: Obstetrics and Gynecology

## 2019-11-22 ENCOUNTER — Encounter: Payer: BC Managed Care – PPO | Admitting: Obstetrics and Gynecology

## 2019-11-22 ENCOUNTER — Other Ambulatory Visit: Payer: Self-pay

## 2019-11-22 ENCOUNTER — Encounter: Payer: Self-pay | Admitting: Obstetrics and Gynecology

## 2019-11-22 VITALS — BP 122/80 | Ht 68.0 in | Wt 221.0 lb

## 2019-11-22 DIAGNOSIS — Z01419 Encounter for gynecological examination (general) (routine) without abnormal findings: Secondary | ICD-10-CM | POA: Diagnosis not present

## 2019-11-22 NOTE — Progress Notes (Signed)
Ave [facility] Dec 13, 1975 [id number]  SUBJECTIVE:  44 y.o. G0P0 female for annual routine gynecologic exam. She has no gynecologic concerns.  She did come off of the birth control pills as she started lamotrigine as part of her new migraine regimen, which is working pretty well for her.  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  Current Outpatient Medications  Medication Sig Dispense Refill  . clobetasol cream (TEMOVATE) 0.05 % Apply to affected area twice daily as needed 30 g 1  . Erenumab-aooe (AIMOVIG) 70 MG/ML SOAJ Inject into the skin.    Marland Kitchen escitalopram (LEXAPRO) 20 MG tablet Take 1 tablet (20 mg total) by mouth daily. 90 tablet 1  . lamoTRIgine (LAMICTAL) 25 MG tablet Take 75 mg by mouth 2 (two) times daily.     . Magnesium 250 MG TABS Take 250 mg by mouth 3 (three) times daily.      . Omega-3 Fatty Acids (OMEGA 3 PO) Take by mouth.      . SUMAtriptan (IMITREX) 100 MG tablet Take 100 mg by mouth as needed.      . traZODone (DESYREL) 100 MG tablet Take 1/2 to one tab at bed time for insomnia 90 tablet 0  . valACYclovir (VALTREX) 500 MG tablet Take 1 tablet (500 mg total) by mouth daily. 90 tablet 4  . ZOMIG 5 MG nasal solution   3  . zonisamide (ZONEGRAN) 100 MG capsule Take 300 mg by mouth daily.      No current facility-administered medications for this visit.   Allergies: Compazine and Other  Patient's last menstrual period was 11/15/2019.  Past medical history,surgical history, problem list, medications, allergies, family history and social history were all reviewed and documented as reviewed in the EPIC chart.  ROS: Pertinent positives and negatives as reviewed in HPI  OBJECTIVE:  Ht 5\' 8"  (1.727 m)   Wt 221 lb (100.2 kg)   LMP 11/15/2019   BMI 33.60 kg/m  The patient appears well, alert, oriented, in no distress. Lungs are clear, good air entry, no wheezes, rhonchi or rales. S1 and S2 normal, no murmurs, regular rate and rhythm.  Abdomen soft  without tenderness, guarding, mass or organomegaly.  Neurological is normal, no focal findings.  BREAST EXAM: breasts appear normal, no suspicious masses, no skin or nipple changes or axillary nodes  PELVIC EXAM: VULVA: normal appearing vulva with no masses, tenderness or lesions, VAGINA: normal appearing vagina with normal color and discharge, no lesions, CERVIX: normal appearing cervix without discharge or lesions, UTERUS: uterus is normal size, shape, consistency and nontender, ADNEXA: normal adnexa in size, nontender and no masses  Chaperone: Caryn Bee present during the examination  ASSESSMENT:  44 y.o. G0P0 here for annual gynecologic exam  PLAN:   1. Chronic vulvar irritation.  Not currently bothering her.  Responds well to clobetasol 0.05% cream as needed.  She indicates she has not use this cream for over a year so she will call if she has any flareups and needs more. 2. Pap smear/HPV 10/2018.  A few ASCUS Pap smears in the past with negative HPV results.  Next Pap smear due 2025 following the current guidelines recommending the 5 year interval. 3. Contraception.  Stopped the OCP and menstrual period has been fairly light and regular.  No concerns.  Currently not in need of contraception.  Will let us know if that changes. 4. Mammogram 07/2018.  Breast exam normal.  She is reminded to schedule an annual mammogram.  5. Health maintenance.  No labs today as she normally has these completed elsewhere.  Return annually or sooner, prn.   Joseph Pierini MD 11/22/19

## 2019-12-10 DIAGNOSIS — M542 Cervicalgia: Secondary | ICD-10-CM | POA: Diagnosis not present

## 2019-12-10 DIAGNOSIS — G518 Other disorders of facial nerve: Secondary | ICD-10-CM | POA: Diagnosis not present

## 2019-12-10 DIAGNOSIS — G43019 Migraine without aura, intractable, without status migrainosus: Secondary | ICD-10-CM | POA: Diagnosis not present

## 2019-12-10 DIAGNOSIS — M791 Myalgia, unspecified site: Secondary | ICD-10-CM | POA: Diagnosis not present

## 2019-12-10 DIAGNOSIS — G43719 Chronic migraine without aura, intractable, without status migrainosus: Secondary | ICD-10-CM | POA: Diagnosis not present

## 2019-12-10 DIAGNOSIS — G43839 Menstrual migraine, intractable, without status migrainosus: Secondary | ICD-10-CM | POA: Diagnosis not present

## 2020-01-01 ENCOUNTER — Other Ambulatory Visit (HOSPITAL_COMMUNITY): Payer: Self-pay | Admitting: Psychiatry

## 2020-01-01 DIAGNOSIS — F330 Major depressive disorder, recurrent, mild: Secondary | ICD-10-CM

## 2020-01-08 ENCOUNTER — Other Ambulatory Visit: Payer: Self-pay | Admitting: Obstetrics and Gynecology

## 2020-01-08 DIAGNOSIS — D225 Melanocytic nevi of trunk: Secondary | ICD-10-CM | POA: Diagnosis not present

## 2020-01-08 DIAGNOSIS — Z1231 Encounter for screening mammogram for malignant neoplasm of breast: Secondary | ICD-10-CM

## 2020-01-08 DIAGNOSIS — L821 Other seborrheic keratosis: Secondary | ICD-10-CM | POA: Diagnosis not present

## 2020-01-08 DIAGNOSIS — D2261 Melanocytic nevi of right upper limb, including shoulder: Secondary | ICD-10-CM | POA: Diagnosis not present

## 2020-01-08 DIAGNOSIS — L720 Epidermal cyst: Secondary | ICD-10-CM | POA: Diagnosis not present

## 2020-01-08 DIAGNOSIS — D2262 Melanocytic nevi of left upper limb, including shoulder: Secondary | ICD-10-CM | POA: Diagnosis not present

## 2020-01-16 DIAGNOSIS — M542 Cervicalgia: Secondary | ICD-10-CM | POA: Diagnosis not present

## 2020-01-16 DIAGNOSIS — G43019 Migraine without aura, intractable, without status migrainosus: Secondary | ICD-10-CM | POA: Diagnosis not present

## 2020-01-16 DIAGNOSIS — G518 Other disorders of facial nerve: Secondary | ICD-10-CM | POA: Diagnosis not present

## 2020-01-16 DIAGNOSIS — M791 Myalgia, unspecified site: Secondary | ICD-10-CM | POA: Diagnosis not present

## 2020-01-16 DIAGNOSIS — G43719 Chronic migraine without aura, intractable, without status migrainosus: Secondary | ICD-10-CM | POA: Diagnosis not present

## 2020-01-16 DIAGNOSIS — G43839 Menstrual migraine, intractable, without status migrainosus: Secondary | ICD-10-CM | POA: Diagnosis not present

## 2020-01-31 DIAGNOSIS — S060X0A Concussion without loss of consciousness, initial encounter: Secondary | ICD-10-CM | POA: Diagnosis not present

## 2020-01-31 DIAGNOSIS — G44319 Acute post-traumatic headache, not intractable: Secondary | ICD-10-CM | POA: Diagnosis not present

## 2020-02-18 ENCOUNTER — Ambulatory Visit: Payer: BC Managed Care – PPO

## 2020-02-21 IMAGING — MG DIGITAL DIAGNOSTIC UNILATERAL RIGHT MAMMOGRAM WITH TOMO AND CAD
4 series · 4 of 12 positions shown · non-contrast
Comparison: Previous exam(s).

CLINICAL DATA: Patient had a recent chest x-ray on 07/28/2018 where
a mass was seen in the right breast. History of reduction
mammoplasty.

EXAM:
DIGITAL DIAGNOSTIC UNILATERAL RIGHT MAMMOGRAM WITH CAD AND TOMO

[R MLO synth-2D]
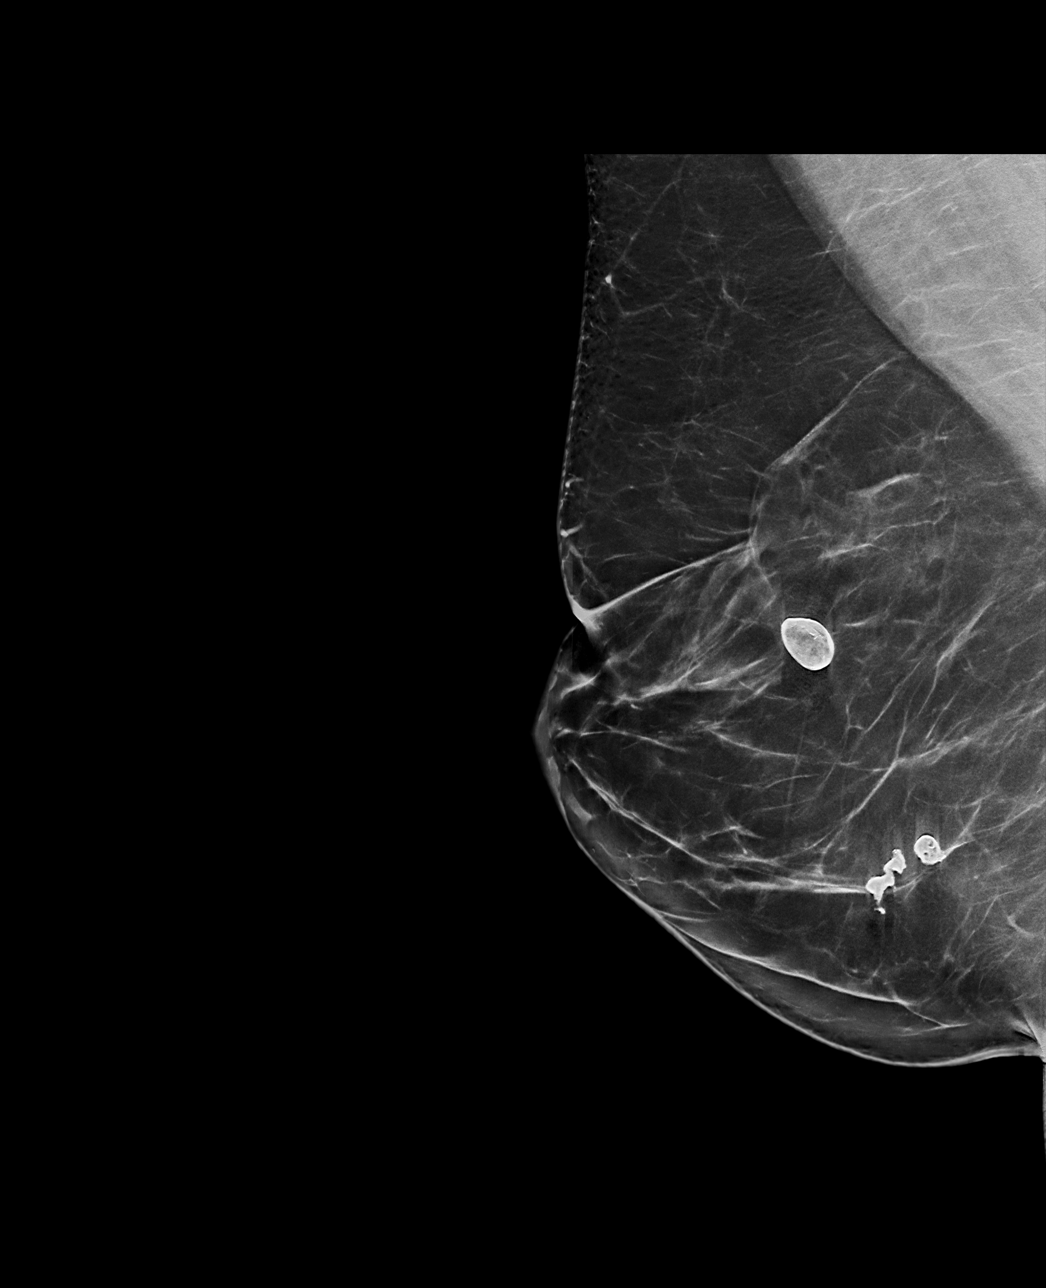

[R CC synth-2D]
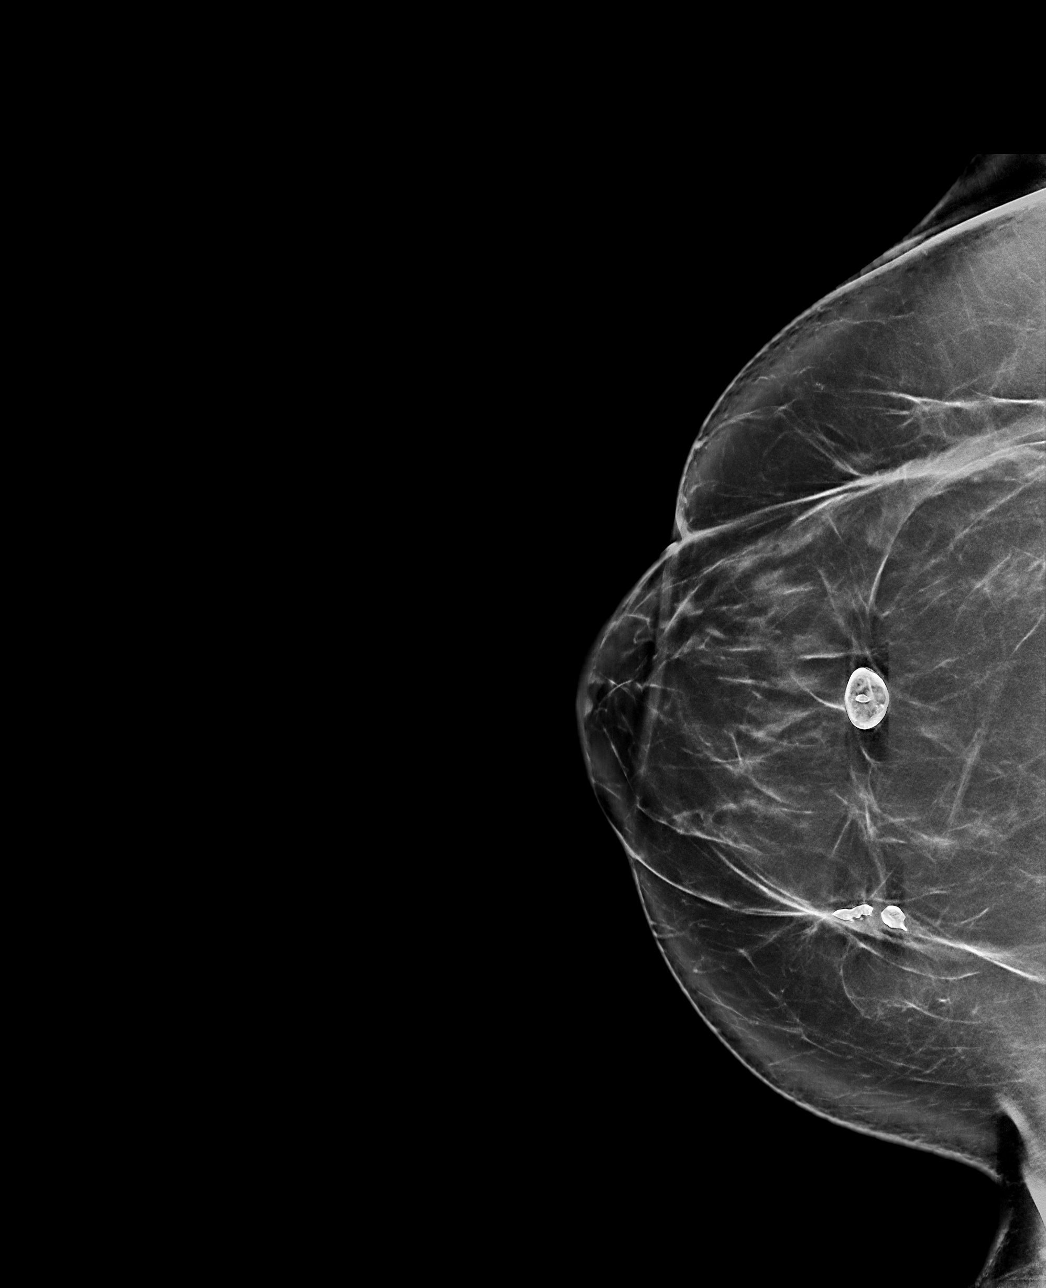

[R MLO tomo · tomo slice 49/98.0]
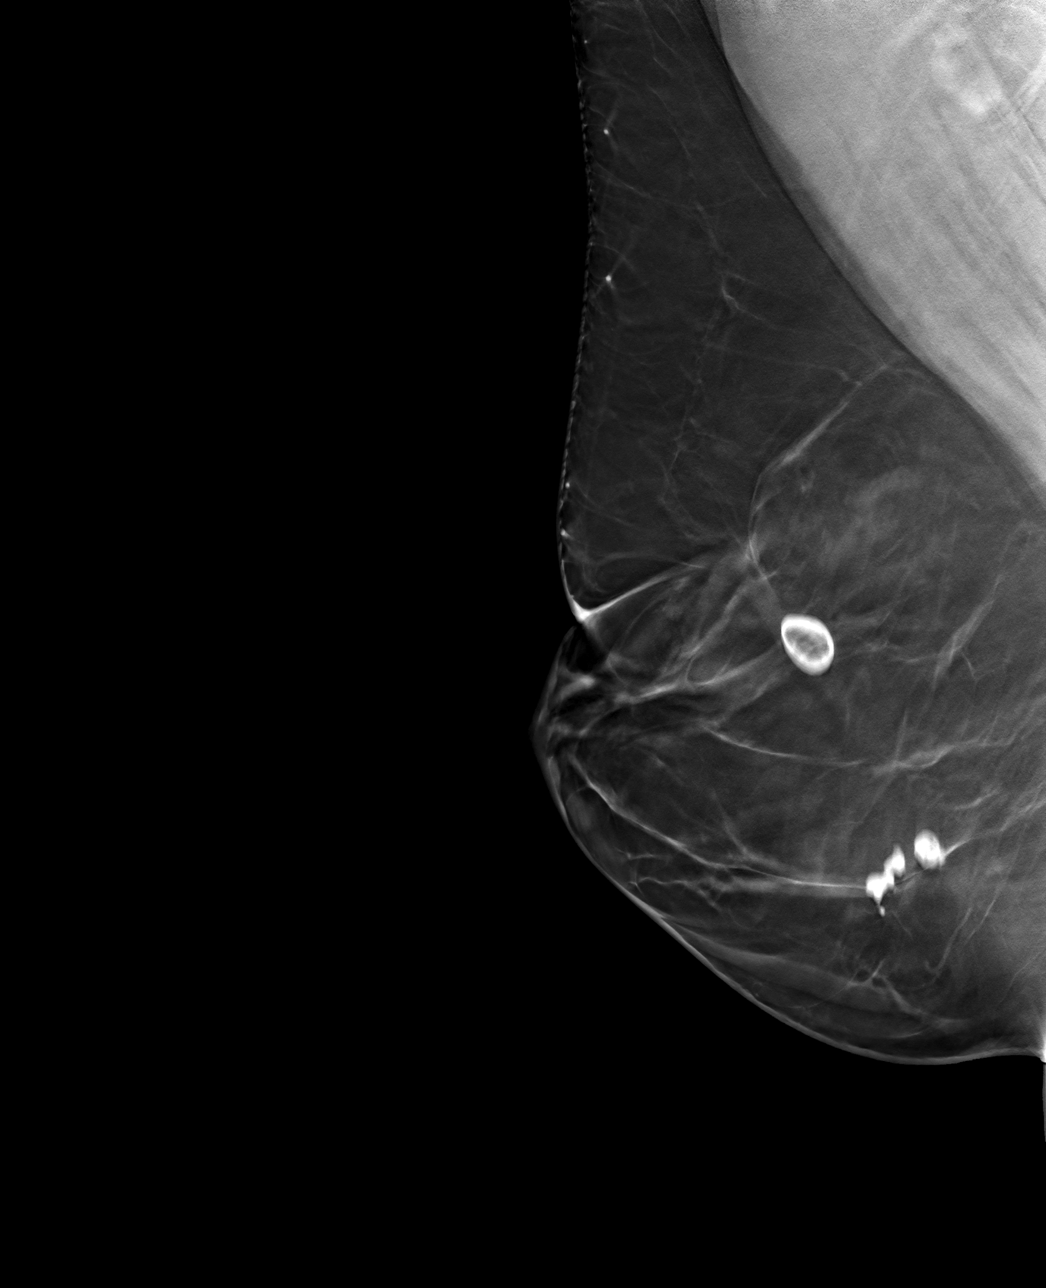

[R CC tomo · tomo slice 49/98.0]
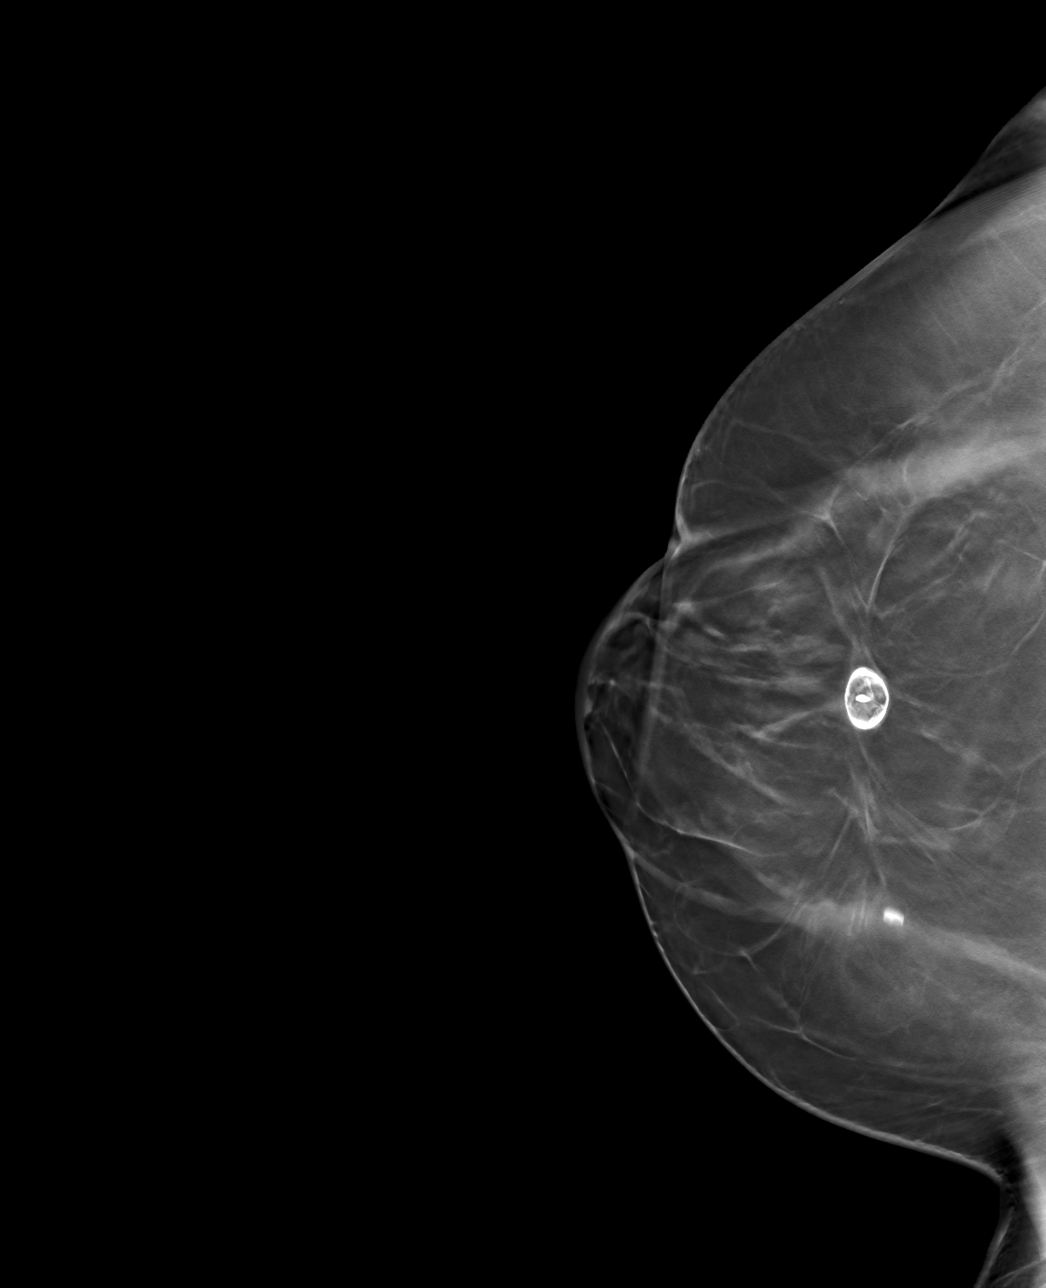

[4 of 12 positions shown; findings below may reference images not displayed]

ACR Breast Density Category b: There are scattered areas of
fibroglandular density.
FINDINGS: There are changes of reduction mammoplasty. There is a 1.2 cm
dystrophic calcifications in the central aspect of the right breast
corresponding to the abnormality seen on the patient's chest x-ray.
No suspicious mass or malignant type microcalcifications identified
in the right breast.

Mammographic images were processed with CAD.
IMPRESSION: No evidence of malignancy in the right breast.

RECOMMENDATION:
Bilateral screening mammogram in Tuesday December, 2018 is recommended.

I have discussed the findings and recommendations with the patient.
Results were also provided in writing at the conclusion of the
visit. If applicable, a reminder letter will be sent to the patient
regarding the next appointment.

BI-RADS CATEGORY  2: Benign.

## 2020-02-26 DIAGNOSIS — M791 Myalgia, unspecified site: Secondary | ICD-10-CM | POA: Diagnosis not present

## 2020-02-26 DIAGNOSIS — G43019 Migraine without aura, intractable, without status migrainosus: Secondary | ICD-10-CM | POA: Diagnosis not present

## 2020-02-26 DIAGNOSIS — G43839 Menstrual migraine, intractable, without status migrainosus: Secondary | ICD-10-CM | POA: Diagnosis not present

## 2020-02-26 DIAGNOSIS — M542 Cervicalgia: Secondary | ICD-10-CM | POA: Diagnosis not present

## 2020-02-26 DIAGNOSIS — G518 Other disorders of facial nerve: Secondary | ICD-10-CM | POA: Diagnosis not present

## 2020-02-26 DIAGNOSIS — G43719 Chronic migraine without aura, intractable, without status migrainosus: Secondary | ICD-10-CM | POA: Diagnosis not present

## 2020-03-04 ENCOUNTER — Ambulatory Visit
Admission: RE | Admit: 2020-03-04 | Discharge: 2020-03-04 | Disposition: A | Discharge status: Home / Self Care | Payer: BC Managed Care – PPO | Source: Ambulatory Visit | Attending: Obstetrics and Gynecology | Admitting: Obstetrics and Gynecology

## 2020-03-04 ENCOUNTER — Other Ambulatory Visit: Payer: Self-pay

## 2020-03-04 DIAGNOSIS — Z1231 Encounter for screening mammogram for malignant neoplasm of breast: Secondary | ICD-10-CM

## 2020-03-12 DIAGNOSIS — F4323 Adjustment disorder with mixed anxiety and depressed mood: Secondary | ICD-10-CM | POA: Diagnosis not present

## 2020-03-16 ENCOUNTER — Other Ambulatory Visit: Payer: Self-pay

## 2020-03-16 NOTE — Telephone Encounter (Signed)
Medication refill request: Valtrex 500mg  daily  Last AEX:  11/22/19 JK Next AEX: 11/23/20 JK Last MMG (if hormonal medication request): 03/09/20 Bi-rads 1 Neg  Refill authorized: #[id number] with 2 RF pended for today.

## 2020-03-17 MED ORDER — VALACYCLOVIR HCL 500 MG PO TABS
500.0000 mg | ORAL_TABLET | Freq: Every day | ORAL | 2 refills | Status: DC
Start: 2020-03-17 — End: 2020-12-29

## 2020-03-18 ENCOUNTER — Other Ambulatory Visit (HOSPITAL_COMMUNITY): Payer: Self-pay | Admitting: Psychiatry

## 2020-03-18 DIAGNOSIS — F419 Anxiety disorder, unspecified: Secondary | ICD-10-CM

## 2020-03-18 DIAGNOSIS — F330 Major depressive disorder, recurrent, mild: Secondary | ICD-10-CM

## 2020-03-30 ENCOUNTER — Encounter (HOSPITAL_COMMUNITY): Payer: Self-pay | Admitting: Psychiatry

## 2020-03-30 ENCOUNTER — Other Ambulatory Visit: Payer: Self-pay

## 2020-03-30 ENCOUNTER — Telehealth (INDEPENDENT_AMBULATORY_CARE_PROVIDER_SITE_OTHER): Payer: BC Managed Care – PPO | Admitting: Psychiatry

## 2020-03-30 ENCOUNTER — Other Ambulatory Visit (HOSPITAL_COMMUNITY): Payer: Self-pay | Admitting: Psychiatry

## 2020-03-30 DIAGNOSIS — F419 Anxiety disorder, unspecified: Secondary | ICD-10-CM | POA: Diagnosis not present

## 2020-03-30 DIAGNOSIS — F330 Major depressive disorder, recurrent, mild: Secondary | ICD-10-CM

## 2020-03-30 MED ORDER — TRAZODONE HCL 100 MG PO TABS: ORAL_TABLET | ORAL | 0 refills | Status: DC

## 2020-03-30 MED ORDER — ESCITALOPRAM OXALATE 20 MG PO TABS: 20.0000 mg | ORAL_TABLET | Freq: Every day | ORAL | 1 refills | Status: DC

## 2020-03-30 NOTE — Progress Notes (Signed)
Virtual Visit via Telephone Note  I connected with [name] on 03/30/20 at  4:00 PM EST by telephone and verified that I am speaking with the correct person using two identifiers.  Location: Patient: Work Provider: Biomedical scientist   I discussed the limitations, risks, security and privacy concerns of performing an evaluation and management service by telephone and the availability of in person appointments. I also discussed with the patient that there may be a patient responsible charge related to this service. The patient expressed understanding and agreed to proceed.   History of Present Illness: Patient is evaluated by phone session.  She is stable on her current medication.  She started going to the gym and regular exercise and she is able to lost the weight from the past visit.  Her headaches are also stable since taking the medication.  She is working in some time her job is very busy.  However she feels good because she is keeping herself busy at work.  She saw a therapist few times [name] and that helps her coping skills.  Recently she saw her physician Dr. Ardeth Perfect and she was told everything is good.  She started doing CrossFit as decided not to play tennis because it does not fit with her schedule.  She does not want to change the medication.  She is sleeping good.  She denies any crying spells or any feeling of hopelessness or suicidal thoughts.  She denies any panic attack.  She has no tremors shakes or any EPS.  She like to keep the current medication.  Past Psychiatric History: H/Odepression since teens.TriedPaxil, Lunesta and briefly Wellbutrin.Goodresponse with Cymbalta until stopped working. No h/osuicidal attempt, psychiatric inpatient treatment, psychosis or self abusive behavior.   Psychiatric Specialty Exam: Physical Exam  Review of Systems  Weight 205 lb (93 kg), last menstrual period 03/04/2020.There is no height or weight on file to calculate BMI.   General Appearance: NA  Eye Contact:  NA  Speech:  Clear and Coherent and Normal Rate  Volume:  Normal  Mood:  Euthymic  Affect:  NA  Thought Process:  Goal Directed  Orientation:  Full (Time, Place, and Person)  Thought Content:  Logical  Suicidal Thoughts:  No  Homicidal Thoughts:  No  Memory:  Immediate;   Good Recent;   Good Remote;   Good  Judgement:  Good  Insight:  Good  Psychomotor Activity:  NA  Concentration:  Concentration: Good and Attention Span: Good  Recall:  Good  Fund of Knowledge:  Good  Language:  Good  Akathisia:  No  Handed:  Right  AIMS (if indicated):     Assets:  Communication Skills Desire for Improvement Housing Resilience Social Support Talents/Skills Transportation  ADL's:  Intact  Cognition:  WNL  Sleep:   ok     Assessment and Plan: Major depressive disorder, recurrent.  Anxiety.  Patient is stable on Lexapro 20 mg and take trazodone sometimes 50 and sometimes 100 mg at bedtime.  She is also taking Lamictal from neurology for her headaches which is working well.  Discussed medication side effects and benefits.  Patient will send her lab results which was done recently.  Recommended to call us back if is any question or any concern.  Follow-up in 3 months.  Follow Up Instructions:    I discussed the assessment and treatment plan with the patient. The patient was provided an opportunity to ask questions and all were answered. The patient agreed with the plan  and demonstrated an understanding of the instructions.   The patient was advised to call back or seek an in-person evaluation if the symptoms worsen or if the condition fails to improve as anticipated.  I provided 15 minutes of non-face-to-face time during this encounter.   Kathlee Nations, MD

## 2020-04-08 DIAGNOSIS — M791 Myalgia, unspecified site: Secondary | ICD-10-CM | POA: Diagnosis not present

## 2020-04-08 DIAGNOSIS — G43839 Menstrual migraine, intractable, without status migrainosus: Secondary | ICD-10-CM | POA: Diagnosis not present

## 2020-04-08 DIAGNOSIS — G43019 Migraine without aura, intractable, without status migrainosus: Secondary | ICD-10-CM | POA: Diagnosis not present

## 2020-04-08 DIAGNOSIS — G43719 Chronic migraine without aura, intractable, without status migrainosus: Secondary | ICD-10-CM | POA: Diagnosis not present

## 2020-04-08 DIAGNOSIS — G518 Other disorders of facial nerve: Secondary | ICD-10-CM | POA: Diagnosis not present

## 2020-04-08 DIAGNOSIS — M542 Cervicalgia: Secondary | ICD-10-CM | POA: Diagnosis not present

## 2020-04-24 DIAGNOSIS — N1831 Chronic kidney disease, stage 3a: Secondary | ICD-10-CM | POA: Diagnosis not present

## 2020-04-24 DIAGNOSIS — Z1339 Encounter for screening examination for other mental health and behavioral disorders: Secondary | ICD-10-CM | POA: Diagnosis not present

## 2020-04-24 DIAGNOSIS — Z1212 Encounter for screening for malignant neoplasm of rectum: Secondary | ICD-10-CM | POA: Diagnosis not present

## 2020-04-24 DIAGNOSIS — H6122 Impacted cerumen, left ear: Secondary | ICD-10-CM | POA: Diagnosis not present

## 2020-04-24 DIAGNOSIS — E785 Hyperlipidemia, unspecified: Secondary | ICD-10-CM | POA: Diagnosis not present

## 2020-04-24 DIAGNOSIS — Z0000 Encounter for general adult medical examination without abnormal findings: Secondary | ICD-10-CM | POA: Diagnosis not present

## 2020-04-24 DIAGNOSIS — Z1331 Encounter for screening for depression: Secondary | ICD-10-CM | POA: Diagnosis not present

## 2020-05-19 DIAGNOSIS — G43839 Menstrual migraine, intractable, without status migrainosus: Secondary | ICD-10-CM | POA: Diagnosis not present

## 2020-05-19 DIAGNOSIS — M791 Myalgia, unspecified site: Secondary | ICD-10-CM | POA: Diagnosis not present

## 2020-05-19 DIAGNOSIS — G43719 Chronic migraine without aura, intractable, without status migrainosus: Secondary | ICD-10-CM | POA: Diagnosis not present

## 2020-05-19 DIAGNOSIS — M542 Cervicalgia: Secondary | ICD-10-CM | POA: Diagnosis not present

## 2020-05-19 DIAGNOSIS — G43019 Migraine without aura, intractable, without status migrainosus: Secondary | ICD-10-CM | POA: Diagnosis not present

## 2020-05-19 DIAGNOSIS — G518 Other disorders of facial nerve: Secondary | ICD-10-CM | POA: Diagnosis not present

## 2020-06-30 DIAGNOSIS — G43839 Menstrual migraine, intractable, without status migrainosus: Secondary | ICD-10-CM | POA: Diagnosis not present

## 2020-06-30 DIAGNOSIS — G43719 Chronic migraine without aura, intractable, without status migrainosus: Secondary | ICD-10-CM | POA: Diagnosis not present

## 2020-06-30 DIAGNOSIS — M791 Myalgia, unspecified site: Secondary | ICD-10-CM | POA: Diagnosis not present

## 2020-06-30 DIAGNOSIS — G43019 Migraine without aura, intractable, without status migrainosus: Secondary | ICD-10-CM | POA: Diagnosis not present

## 2020-06-30 DIAGNOSIS — G518 Other disorders of facial nerve: Secondary | ICD-10-CM | POA: Diagnosis not present

## 2020-06-30 DIAGNOSIS — M542 Cervicalgia: Secondary | ICD-10-CM | POA: Diagnosis not present

## 2020-09-13 ENCOUNTER — Other Ambulatory Visit (HOSPITAL_COMMUNITY): Payer: Self-pay | Admitting: Psychiatry

## 2020-09-13 DIAGNOSIS — F419 Anxiety disorder, unspecified: Secondary | ICD-10-CM

## 2020-09-13 DIAGNOSIS — F330 Major depressive disorder, recurrent, mild: Secondary | ICD-10-CM

## 2020-09-14 DIAGNOSIS — G43839 Menstrual migraine, intractable, without status migrainosus: Secondary | ICD-10-CM | POA: Diagnosis not present

## 2020-09-14 DIAGNOSIS — G43019 Migraine without aura, intractable, without status migrainosus: Secondary | ICD-10-CM | POA: Diagnosis not present

## 2020-09-14 DIAGNOSIS — G518 Other disorders of facial nerve: Secondary | ICD-10-CM | POA: Diagnosis not present

## 2020-09-14 DIAGNOSIS — M791 Myalgia, unspecified site: Secondary | ICD-10-CM | POA: Diagnosis not present

## 2020-09-14 DIAGNOSIS — G43719 Chronic migraine without aura, intractable, without status migrainosus: Secondary | ICD-10-CM | POA: Diagnosis not present

## 2020-09-14 DIAGNOSIS — M542 Cervicalgia: Secondary | ICD-10-CM | POA: Diagnosis not present

## 2020-09-30 ENCOUNTER — Telehealth (HOSPITAL_COMMUNITY): Payer: BC Managed Care – PPO | Admitting: Psychiatry

## 2020-10-05 ENCOUNTER — Other Ambulatory Visit (HOSPITAL_COMMUNITY): Payer: Self-pay | Admitting: *Deleted

## 2020-10-05 ENCOUNTER — Telehealth (HOSPITAL_COMMUNITY): Payer: Self-pay | Admitting: *Deleted

## 2020-10-05 DIAGNOSIS — F330 Major depressive disorder, recurrent, mild: Secondary | ICD-10-CM

## 2020-10-05 DIAGNOSIS — F419 Anxiety disorder, unspecified: Secondary | ICD-10-CM

## 2020-10-05 MED ORDER — ESCITALOPRAM OXALATE 20 MG PO TABS: 20.0000 mg | ORAL_TABLET | Freq: Every day | ORAL | 0 refills | Status: DC

## 2020-10-05 NOTE — Telephone Encounter (Signed)
Writer received t/c requesting refill of Lexapro 20 mg. Pt states that she has left several messages and that pharmacy, [facility] [address], had faxed refill requests also. Writer has not received messages from pt or pharmacy. [facility] sent until her appointment on 10/07/20.

## 2020-10-07 ENCOUNTER — Other Ambulatory Visit: Payer: Self-pay

## 2020-10-07 ENCOUNTER — Encounter (HOSPITAL_COMMUNITY): Payer: Self-pay | Admitting: Psychiatry

## 2020-10-07 ENCOUNTER — Telehealth (INDEPENDENT_AMBULATORY_CARE_PROVIDER_SITE_OTHER): Payer: BC Managed Care – PPO | Admitting: Psychiatry

## 2020-10-07 DIAGNOSIS — F330 Major depressive disorder, recurrent, mild: Secondary | ICD-10-CM | POA: Diagnosis not present

## 2020-10-07 DIAGNOSIS — F419 Anxiety disorder, unspecified: Secondary | ICD-10-CM

## 2020-10-07 MED ORDER — TRAZODONE HCL 100 MG PO TABS: ORAL_TABLET | ORAL | 0 refills | Status: DC

## 2020-10-07 MED ORDER — ESCITALOPRAM OXALATE 20 MG PO TABS
20.0000 mg | ORAL_TABLET | Freq: Every day | ORAL | 2 refills | Status: DC
Start: 2020-10-07 — End: 2021-03-25

## 2020-10-07 NOTE — Progress Notes (Signed)
Virtual Visit via Telephone Note  I connected with [name] on 10/07/20 at  3:40 PM EDT by telephone and verified that I am speaking with the correct person using two identifiers.  Location: Patient: Home Provider: Home Office   I discussed the limitations, risks, security and privacy concerns of performing an evaluation and management service by telephone and the availability of in person appointments. I also discussed with the patient that there may be a patient responsible charge related to this service. The patient expressed understanding and agreed to proceed.   History of Present Illness: Patient is evaluated by phone session.  She is stable on her current medication.  She feels her depression anxiety is under control.  She is trying to keep herself busy at work and her job is also busy.  She is sleeping good.  She see her therapist [name] few times as needed.  She is not running but does go to gym frequently and she is happy her weight is stable.  She denies any crying spells or any feeling of hopelessness or worthlessness.  She denies any panic attack.  Her energy level is good.  She has no tremors, shakes or any EPS.  Past Psychiatric History:  H/O depression since teens. Tried Paxil, Lunesta and briefly Wellbutrin.  Good response with Cymbalta until stopped working.  No h/o suicidal attempt, psychiatric inpatient treatment, psychosis or self abusive behavior.    Psychiatric Specialty Exam: Physical Exam  Review of Systems  Weight 205 lb (93 kg).There is no height or weight on file to calculate BMI.  General Appearance: NA  Eye Contact:  NA  Speech:  Clear and Coherent  Volume:  Normal  Mood:  Euthymic  Affect:  NA  Thought Process:  Goal Directed  Orientation:  Full (Time, Place, and Person)  Thought Content:  WDL  Suicidal Thoughts:  No  Homicidal Thoughts:  No  Memory:  Immediate;   Good Recent;   Good Remote;   Good  Judgement:  Good  Insight:  Present   Psychomotor Activity:  NA  Concentration:  Concentration: Good and Attention Span: Good  Recall:  Good  Fund of Knowledge:  Good  Language:  Good  Akathisia:  No  Handed:  Right  AIMS (if indicated):     Assets:  Communication Skills Desire for [place] Talents/Skills Transportation  ADL's:  Intact  Cognition:  WNL  Sleep:   ok      Assessment and Plan: Major depressive disorder, recurrent.  Anxiety.  Patient is stable on Lexapro 20 mg daily and trazodone 50-100 mg as needed for insomnia.  Discussed medication side effects and benefits.  Her headaches are much better since taking the Lamictal from neurology.  Recommended to call us back if she has any question of any concern.  Follow-up in 6 months  Follow Up Instructions:    I discussed the assessment and treatment plan with the patient. The patient was provided an opportunity to ask questions and all were answered. The patient agreed with the plan and demonstrated an understanding of the instructions.   The patient was advised to call back or seek an in-person evaluation if the symptoms worsen or if the condition fails to improve as anticipated.  I provided 13 minutes of non-face-to-face time during this encounter.   Kathlee Nations, MD

## 2020-10-27 DIAGNOSIS — M542 Cervicalgia: Secondary | ICD-10-CM | POA: Diagnosis not present

## 2020-10-27 DIAGNOSIS — G43019 Migraine without aura, intractable, without status migrainosus: Secondary | ICD-10-CM | POA: Diagnosis not present

## 2020-10-27 DIAGNOSIS — M791 Myalgia, unspecified site: Secondary | ICD-10-CM | POA: Diagnosis not present

## 2020-10-27 DIAGNOSIS — G43839 Menstrual migraine, intractable, without status migrainosus: Secondary | ICD-10-CM | POA: Diagnosis not present

## 2020-10-27 DIAGNOSIS — G518 Other disorders of facial nerve: Secondary | ICD-10-CM | POA: Diagnosis not present

## 2020-10-27 DIAGNOSIS — G43719 Chronic migraine without aura, intractable, without status migrainosus: Secondary | ICD-10-CM | POA: Diagnosis not present

## 2020-11-23 ENCOUNTER — Other Ambulatory Visit: Payer: Self-pay

## 2020-11-23 ENCOUNTER — Encounter: Payer: Self-pay | Admitting: Nurse Practitioner

## 2020-11-23 ENCOUNTER — Ambulatory Visit: Payer: BC Managed Care – PPO | Admitting: Obstetrics and Gynecology

## 2020-11-23 ENCOUNTER — Ambulatory Visit (INDEPENDENT_AMBULATORY_CARE_PROVIDER_SITE_OTHER): Payer: BC Managed Care – PPO | Admitting: Nurse Practitioner

## 2020-11-23 VITALS — BP 124/86 | Ht 67.5 in | Wt 212.0 lb

## 2020-11-23 DIAGNOSIS — Z01419 Encounter for gynecological examination (general) (routine) without abnormal findings: Secondary | ICD-10-CM | POA: Diagnosis not present

## 2020-11-23 NOTE — Progress Notes (Signed)
   [name] [facility] 1975-03-28 [id number]   History:  45 y.o. G0 presents for annual exam. Light monthly cycles. Normal pap history. History of chronic vulvitis - Clobetasol a needed. Migraines managed by neurology, depression/anxiety managed by psychiatry.   Gynecologic History Patient's last menstrual period was 11/09/2020. Period Cycle (Days): 28 Period Duration (Days): 4 Period Pattern: Regular Menstrual Flow: Moderate Dysmenorrhea: (!) Mild Dysmenorrhea Symptoms: Cramping Contraception/Family planning: none Sexually active: No  Health Maintenance Last Pap: 11/13/2018. Results were: Normal, 5-year repeat Last mammogram: 03/04/2020. 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   ROS:  A ROS was performed and pertinent positives and negatives are included.  Exam:  Vitals:   11/23/20 1448  BP: 124/86  Weight: 212 lb (96.2 kg)  Height: 5' 7.5" (1.715 m)   Body mass index is 32.71 kg/m.  General appearance:  Normal Thyroid:  Symmetrical, normal in size, without palpable masses or nodularity. Respiratory  Auscultation:  Clear without wheezing or rhonchi Cardiovascular  Auscultation:  Regular rate, without rubs, murmurs or gallops  Edema/varicosities:  Not grossly evident Abdominal  Soft,nontender, without masses, guarding or rebound.  Liver/spleen:  No organomegaly noted  Hernia:  None appreciated  Skin  Inspection:  Grossly normal Breasts: Examined lying and sitting.   Right: Without masses, retractions, nipple discharge or axillary adenopathy.   Left: Without masses, retractions, nipple discharge or axillary adenopathy. Genitourinary   Inguinal/mons:  Normal without inguinal adenopathy  External genitalia:  Normal appearing vulva with no masses,  tenderness, or lesions  BUS/Urethra/Skene's glands:  Normal  Vagina:  Normal appearing with normal color and discharge, no lesions  Cervix:  Normal appearing without discharge or lesions  Uterus:  Normal in size, shape and contour.  Midline and mobile, nontender  Adnexa/parametria:     Rt: Normal in size, without masses or tenderness.   Lt: Normal in size, without masses or tenderness.  Anus and perineum: Normal  Patient informed chaperone available to be present for breast and pelvic exam. Patient has requested no chaperone to be present. Patient has been advised what will be completed during breast and pelvic exam.   Assessment/Plan:  45 y.o. G0 for annual exam.   Well female exam with routine gynecological exam - Education provided on SBEs, importance of preventative screenings, current guidelines, high calcium diet, regular exercise, and multivitamin daily. Labs with PCP.   Screening for cervical cancer - Normal Pap history.  Will repeat at 5-year interval per guidelines.  Screening for breast cancer - Normal mammogram history.  Continue annual screenings.  Normal breast exam today.  Screening for colon cancer - First colonoscopy scheduled in January. Family history of colon cancer.    Return in 1 year for annual.   Tamela Gammon DNP, 3:14 PM 11/23/2020

## 2020-12-07 DIAGNOSIS — M542 Cervicalgia: Secondary | ICD-10-CM | POA: Diagnosis not present

## 2020-12-07 DIAGNOSIS — M791 Myalgia, unspecified site: Secondary | ICD-10-CM | POA: Diagnosis not present

## 2020-12-07 DIAGNOSIS — G43019 Migraine without aura, intractable, without status migrainosus: Secondary | ICD-10-CM | POA: Diagnosis not present

## 2020-12-07 DIAGNOSIS — G518 Other disorders of facial nerve: Secondary | ICD-10-CM | POA: Diagnosis not present

## 2020-12-07 DIAGNOSIS — G43719 Chronic migraine without aura, intractable, without status migrainosus: Secondary | ICD-10-CM | POA: Diagnosis not present

## 2020-12-07 DIAGNOSIS — H01139 Eczematous dermatitis of unspecified eye, unspecified eyelid: Secondary | ICD-10-CM | POA: Diagnosis not present

## 2020-12-07 DIAGNOSIS — G43839 Menstrual migraine, intractable, without status migrainosus: Secondary | ICD-10-CM | POA: Diagnosis not present

## 2020-12-29 ENCOUNTER — Other Ambulatory Visit: Payer: Self-pay

## 2020-12-29 MED ORDER — VALACYCLOVIR HCL 500 MG PO TABS: 500.0000 mg | ORAL_TABLET | Freq: Every day | ORAL | 3 refills | Status: DC

## 2020-12-29 NOTE — Telephone Encounter (Signed)
AEX 11/23/20 with TW, NP.

## 2021-01-05 ENCOUNTER — Telehealth: Payer: BC Managed Care – PPO | Admitting: Physician Assistant

## 2021-01-05 DIAGNOSIS — R6889 Other general symptoms and signs: Secondary | ICD-10-CM

## 2021-01-05 MED ORDER — OSELTAMIVIR PHOSPHATE 75 MG PO CAPS: 75.0000 mg | ORAL_CAPSULE | Freq: Two times a day (BID) | ORAL | 0 refills | Status: DC

## 2021-01-05 NOTE — Progress Notes (Signed)

## 2021-01-05 NOTE — Progress Notes (Signed)
I have spent 5 minutes in review of e-visit questionnaire, review and updating patient chart, medical decision making and response to patient.   Eldo Umanzor Cody Eliya Geiman, PA-C    

## 2021-01-20 DIAGNOSIS — G43719 Chronic migraine without aura, intractable, without status migrainosus: Secondary | ICD-10-CM | POA: Diagnosis not present

## 2021-01-20 DIAGNOSIS — M791 Myalgia, unspecified site: Secondary | ICD-10-CM | POA: Diagnosis not present

## 2021-01-20 DIAGNOSIS — M542 Cervicalgia: Secondary | ICD-10-CM | POA: Diagnosis not present

## 2021-01-20 DIAGNOSIS — G43839 Menstrual migraine, intractable, without status migrainosus: Secondary | ICD-10-CM | POA: Diagnosis not present

## 2021-01-20 DIAGNOSIS — G518 Other disorders of facial nerve: Secondary | ICD-10-CM | POA: Diagnosis not present

## 2021-01-20 DIAGNOSIS — G43019 Migraine without aura, intractable, without status migrainosus: Secondary | ICD-10-CM | POA: Diagnosis not present

## 2021-01-26 DIAGNOSIS — L718 Other rosacea: Secondary | ICD-10-CM | POA: Diagnosis not present

## 2021-01-26 DIAGNOSIS — L821 Other seborrheic keratosis: Secondary | ICD-10-CM | POA: Diagnosis not present

## 2021-01-26 DIAGNOSIS — B078 Other viral warts: Secondary | ICD-10-CM | POA: Diagnosis not present

## 2021-01-26 DIAGNOSIS — D225 Melanocytic nevi of trunk: Secondary | ICD-10-CM | POA: Diagnosis not present

## 2021-01-26 DIAGNOSIS — D2262 Melanocytic nevi of left upper limb, including shoulder: Secondary | ICD-10-CM | POA: Diagnosis not present

## 2021-02-15 DIAGNOSIS — K649 Unspecified hemorrhoids: Secondary | ICD-10-CM | POA: Diagnosis not present

## 2021-02-15 DIAGNOSIS — Z800 Family history of malignant neoplasm of digestive organs: Secondary | ICD-10-CM | POA: Diagnosis not present

## 2021-02-15 DIAGNOSIS — Z1211 Encounter for screening for malignant neoplasm of colon: Secondary | ICD-10-CM | POA: Diagnosis not present

## 2021-02-15 DIAGNOSIS — Z8371 Family history of colonic polyps: Secondary | ICD-10-CM | POA: Diagnosis not present

## 2021-02-16 DIAGNOSIS — F4323 Adjustment disorder with mixed anxiety and depressed mood: Secondary | ICD-10-CM | POA: Diagnosis not present

## 2021-03-04 DIAGNOSIS — G43839 Menstrual migraine, intractable, without status migrainosus: Secondary | ICD-10-CM | POA: Diagnosis not present

## 2021-03-04 DIAGNOSIS — M542 Cervicalgia: Secondary | ICD-10-CM | POA: Diagnosis not present

## 2021-03-04 DIAGNOSIS — G518 Other disorders of facial nerve: Secondary | ICD-10-CM | POA: Diagnosis not present

## 2021-03-04 DIAGNOSIS — G43719 Chronic migraine without aura, intractable, without status migrainosus: Secondary | ICD-10-CM | POA: Diagnosis not present

## 2021-03-04 DIAGNOSIS — M791 Myalgia, unspecified site: Secondary | ICD-10-CM | POA: Diagnosis not present

## 2021-03-04 DIAGNOSIS — G43019 Migraine without aura, intractable, without status migrainosus: Secondary | ICD-10-CM | POA: Diagnosis not present

## 2021-03-25 ENCOUNTER — Encounter (HOSPITAL_COMMUNITY): Payer: Self-pay | Admitting: Psychiatry

## 2021-03-25 ENCOUNTER — Other Ambulatory Visit: Payer: Self-pay

## 2021-03-25 ENCOUNTER — Telehealth (HOSPITAL_BASED_OUTPATIENT_CLINIC_OR_DEPARTMENT_OTHER): Payer: BC Managed Care – PPO | Admitting: Psychiatry

## 2021-03-25 DIAGNOSIS — F330 Major depressive disorder, recurrent, mild: Secondary | ICD-10-CM

## 2021-03-25 DIAGNOSIS — F419 Anxiety disorder, unspecified: Secondary | ICD-10-CM | POA: Diagnosis not present

## 2021-03-25 MED ORDER — TRAZODONE HCL 100 MG PO TABS: ORAL_TABLET | ORAL | 0 refills | Status: DC

## 2021-03-25 MED ORDER — ESCITALOPRAM OXALATE 20 MG PO TABS: 20.0000 mg | ORAL_TABLET | Freq: Every day | ORAL | 1 refills | Status: DC

## 2021-03-25 NOTE — Progress Notes (Signed)
Virtual Visit via Telephone Note ? ?I connected with [name] on 03/25/21 at  4:00 PM EST by telephone and verified that I am speaking with the correct person using two identifiers. ? ?Location: ?Patient: In Car ?Provider: Home Office ?  ?I discussed the limitations, risks, security and privacy concerns of performing an evaluation and management service by telephone and the availability of in person appointments. I also discussed with the patient that there may be a patient responsible charge related to this service. The patient expressed understanding and agreed to proceed. ? ? ?History of Present Illness: ?Patient is evaluated by phone session.  She is busy at work.  She endorsed around the holidays she was down and depressed but now she is doing fine.  She had a call her friend had an accident near her house and she was very anxious.  Since then she gets overwhelmed when she here ambulance sirens but now slowly and gradually she is doing much better.  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  She is sleeping okay.  She denies any crying spells or any panic attack.  Her appetite is okay.  Her energy level is good.  She is in therapy with [name] as needed.  She does not want to change the medication since it is working well.  She denies any anhedonia or any suicidal thoughts.  She had a good support from her parents. ? ?Past Psychiatric History:  ?H/O depression since teens. Tried Paxil, Lunesta and briefly Wellbutrin.  Good response with Cymbalta until stopped working.  No h/o suicidal attempt, psychiatric inpatient treatment, psychosis or self abusive behavior.  ? ?Psychiatric Specialty Exam: ?Physical Exam  ?Review of Systems  ?Weight 200 lb (90.7 kg).There is no height or weight on file to calculate BMI.  ?General Appearance: NA  ?Eye Contact:  NA  ?Speech:  Clear and Coherent and Normal Rate  ?Volume:  Normal  ?Mood:  Euthymic  ?Affect:  NA  ?Thought Process:  Goal Directed   ?Orientation:  Full (Time, Place, and Person)  ?Thought Content:  Logical  ?Suicidal Thoughts:  No  ?Homicidal Thoughts:  No  ?Memory:  Immediate;   Good ?Recent;   Good ?Remote;   Good  ?Judgement:  Intact  ?Insight:  Present  ?Psychomotor Activity:  NA  ?Concentration:  Concentration: Good and Attention Span: Good  ?Recall:  Good  ?Fund of Knowledge:  Good  ?Language:  Good  ?Akathisia:  No  ?Handed:  Right  ?AIMS (if indicated):     ?Assets:  Communication Skills ?Desire for Improvement ?Housing ?Resilience ?Social Support ?Talents/Skills ?Transportation  ?ADL's:  Intact  ?Cognition:  WNL  ?Sleep:   ok  ? ? ? ? ?Assessment and Plan: ?Major depressive disorder, recurrent.  Anxiety. ? ?Patient doing good on current medication.  We will continue Lexapro 20 mg daily and trazodone 50-100 mg at bedtime as needed for insomnia.  Discussed medication side effects and benefits.  Recommended to call us back if she has any question or any concern.  Follow-up in 6 months. ? ?Follow Up Instructions: ? ?  ?I discussed the assessment and treatment plan with the patient. The patient was provided an opportunity to ask questions and all were answered. The patient agreed with the plan and demonstrated an understanding of the instructions. ?  ?The patient was advised to call back or seek an in-person evaluation if the symptoms worsen or if the condition fails to improve as anticipated. ? ?I  provided 12 minutes of non-face-to-face time during this encounter. ? ? ?Kathlee Nations, MD  ?

## 2021-03-30 ENCOUNTER — Telehealth (HOSPITAL_COMMUNITY): Payer: BC Managed Care – PPO | Admitting: Psychiatry

## 2021-04-14 DIAGNOSIS — M791 Myalgia, unspecified site: Secondary | ICD-10-CM | POA: Diagnosis not present

## 2021-04-14 DIAGNOSIS — M542 Cervicalgia: Secondary | ICD-10-CM | POA: Diagnosis not present

## 2021-04-14 DIAGNOSIS — G43719 Chronic migraine without aura, intractable, without status migrainosus: Secondary | ICD-10-CM | POA: Diagnosis not present

## 2021-04-14 DIAGNOSIS — G518 Other disorders of facial nerve: Secondary | ICD-10-CM | POA: Diagnosis not present

## 2021-04-21 ENCOUNTER — Other Ambulatory Visit: Payer: Self-pay | Admitting: Internal Medicine

## 2021-04-21 DIAGNOSIS — Z1231 Encounter for screening mammogram for malignant neoplasm of breast: Secondary | ICD-10-CM

## 2021-04-22 ENCOUNTER — Ambulatory Visit
Admission: RE | Admit: 2021-04-22 | Discharge: 2021-04-22 | Disposition: A | Discharge status: Home / Self Care | Payer: BC Managed Care – PPO | Source: Ambulatory Visit | Attending: Internal Medicine | Admitting: Internal Medicine

## 2021-04-22 DIAGNOSIS — Z1231 Encounter for screening mammogram for malignant neoplasm of breast: Secondary | ICD-10-CM

## 2021-05-12 DIAGNOSIS — N1831 Chronic kidney disease, stage 3a: Secondary | ICD-10-CM | POA: Diagnosis not present

## 2021-05-12 DIAGNOSIS — Z0000 Encounter for general adult medical examination without abnormal findings: Secondary | ICD-10-CM | POA: Diagnosis not present

## 2021-05-12 DIAGNOSIS — Z1331 Encounter for screening for depression: Secondary | ICD-10-CM | POA: Diagnosis not present

## 2021-05-12 DIAGNOSIS — Z1339 Encounter for screening examination for other mental health and behavioral disorders: Secondary | ICD-10-CM | POA: Diagnosis not present

## 2021-05-12 DIAGNOSIS — E785 Hyperlipidemia, unspecified: Secondary | ICD-10-CM | POA: Diagnosis not present

## 2021-05-26 DIAGNOSIS — G518 Other disorders of facial nerve: Secondary | ICD-10-CM | POA: Diagnosis not present

## 2021-05-26 DIAGNOSIS — M542 Cervicalgia: Secondary | ICD-10-CM | POA: Diagnosis not present

## 2021-05-26 DIAGNOSIS — M791 Myalgia, unspecified site: Secondary | ICD-10-CM | POA: Diagnosis not present

## 2021-05-26 DIAGNOSIS — G43719 Chronic migraine without aura, intractable, without status migrainosus: Secondary | ICD-10-CM | POA: Diagnosis not present

## 2021-06-14 ENCOUNTER — Other Ambulatory Visit (HOSPITAL_COMMUNITY): Payer: Self-pay | Admitting: Psychiatry

## 2021-06-14 DIAGNOSIS — F330 Major depressive disorder, recurrent, mild: Secondary | ICD-10-CM

## 2021-06-29 DIAGNOSIS — G43019 Migraine without aura, intractable, without status migrainosus: Secondary | ICD-10-CM | POA: Diagnosis not present

## 2021-06-29 DIAGNOSIS — M791 Myalgia, unspecified site: Secondary | ICD-10-CM | POA: Diagnosis not present

## 2021-06-29 DIAGNOSIS — M542 Cervicalgia: Secondary | ICD-10-CM | POA: Diagnosis not present

## 2021-06-29 DIAGNOSIS — G43839 Menstrual migraine, intractable, without status migrainosus: Secondary | ICD-10-CM | POA: Diagnosis not present

## 2021-06-29 DIAGNOSIS — G43719 Chronic migraine without aura, intractable, without status migrainosus: Secondary | ICD-10-CM | POA: Diagnosis not present

## 2021-06-29 DIAGNOSIS — G518 Other disorders of facial nerve: Secondary | ICD-10-CM | POA: Diagnosis not present

## 2021-08-11 DIAGNOSIS — G518 Other disorders of facial nerve: Secondary | ICD-10-CM | POA: Diagnosis not present

## 2021-08-11 DIAGNOSIS — M791 Myalgia, unspecified site: Secondary | ICD-10-CM | POA: Diagnosis not present

## 2021-08-11 DIAGNOSIS — M542 Cervicalgia: Secondary | ICD-10-CM | POA: Diagnosis not present

## 2021-08-11 DIAGNOSIS — G43719 Chronic migraine without aura, intractable, without status migrainosus: Secondary | ICD-10-CM | POA: Diagnosis not present

## 2021-08-25 ENCOUNTER — Other Ambulatory Visit: Payer: Self-pay

## 2021-08-25 ENCOUNTER — Encounter (HOSPITAL_COMMUNITY): Payer: Self-pay

## 2021-08-25 ENCOUNTER — Emergency Department (HOSPITAL_COMMUNITY): Payer: BC Managed Care – PPO

## 2021-08-25 ENCOUNTER — Emergency Department (HOSPITAL_COMMUNITY)
Admission: EM | Admit: 2021-08-25 | Discharge: 2021-08-25 | Disposition: A | Discharge status: Home / Self Care | Payer: BC Managed Care – PPO | Attending: Emergency Medicine | Admitting: Emergency Medicine

## 2021-08-25 DIAGNOSIS — N202 Calculus of kidney with calculus of ureter: Secondary | ICD-10-CM | POA: Diagnosis not present

## 2021-08-25 DIAGNOSIS — N132 Hydronephrosis with renal and ureteral calculous obstruction: Secondary | ICD-10-CM | POA: Insufficient documentation

## 2021-08-25 DIAGNOSIS — N139 Obstructive and reflux uropathy, unspecified: Secondary | ICD-10-CM | POA: Diagnosis not present

## 2021-08-25 DIAGNOSIS — N200 Calculus of kidney: Secondary | ICD-10-CM

## 2021-08-25 DIAGNOSIS — R109 Unspecified abdominal pain: Secondary | ICD-10-CM | POA: Diagnosis not present

## 2021-08-25 DIAGNOSIS — K429 Umbilical hernia without obstruction or gangrene: Secondary | ICD-10-CM | POA: Diagnosis not present

## 2021-08-25 DIAGNOSIS — M25571 Pain in right ankle and joints of right foot: Secondary | ICD-10-CM | POA: Diagnosis not present

## 2021-08-25 DIAGNOSIS — N1330 Unspecified hydronephrosis: Secondary | ICD-10-CM | POA: Diagnosis not present

## 2021-08-25 LAB — CBC
HCT: 36.1 % (ref 36.0–46.0)
Hemoglobin: 12.7 g/dL (ref 12.0–15.0)
MCH: 31.7 pg (ref 26.0–34.0)
MCHC: 35.2 g/dL (ref 30.0–36.0)
MCV: 90 fL (ref 80.0–100.0)
Platelets: 255 10*3/uL (ref 150–400)
RBC: 4.01 MIL/uL (ref 3.87–5.11)
RDW: 12.4 % (ref 11.5–15.5)
WBC: 10.6 10*3/uL — ABNORMAL HIGH (ref 4.0–10.5)
nRBC: 0 % (ref 0.0–0.2)

## 2021-08-25 LAB — URINALYSIS, ROUTINE W REFLEX MICROSCOPIC
Bilirubin Urine: NEGATIVE
Glucose, UA: NEGATIVE mg/dL
Ketones, ur: NEGATIVE mg/dL
Leukocytes,Ua: NEGATIVE
Nitrite: NEGATIVE
Protein, ur: NEGATIVE mg/dL
RBC / HPF: >50 RBC/hpf — ABNORMAL HIGH (ref 0–5)
Specific Gravity, Urine: 1.017 (ref 1.005–1.030)
pH: 8 (ref 5.0–8.0)

## 2021-08-25 LAB — COMPREHENSIVE METABOLIC PANEL
ALT: 17 U/L (ref 0–44)
AST: 23 U/L (ref 15–41)
Albumin: 4.1 g/dL (ref 3.5–5.0)
Alkaline Phosphatase: 73 U/L (ref 38–126)
Anion gap: 9 (ref 5–15)
BUN: 19 mg/dL (ref 6–20)
CO2: 23 mmol/L (ref 22–32)
Calcium: 8.9 mg/dL (ref 8.9–10.3)
Chloride: 105 mmol/L (ref 98–111)
Creatinine, Ser: 1.19 mg/dL — ABNORMAL HIGH (ref 0.44–1.00)
GFR, Estimated: 57 mL/min — ABNORMAL LOW (ref >60)
Glucose, Bld: 138 mg/dL — ABNORMAL HIGH (ref 70–99)
Potassium: 3.6 mmol/L (ref 3.5–5.1)
Sodium: 137 mmol/L (ref 135–145)
Total Bilirubin: 0.5 mg/dL (ref 0.3–1.2)
Total Protein: 7 g/dL (ref 6.5–8.1)

## 2021-08-25 LAB — LIPASE, BLOOD: Lipase: 31 U/L (ref 11–51)

## 2021-08-25 LAB — I-STAT BETA HCG BLOOD, ED (MC, WL, AP ONLY): I-stat hCG, quantitative: <5 m[IU]/mL (ref <5)

## 2021-08-25 MED ORDER — HYDROMORPHONE HCL 1 MG/ML IJ SOLN
1.0000 mg | Freq: Once | INTRAMUSCULAR | Status: AC
  Administered 2021-08-25: 1 mg via INTRAMUSCULAR
  Filled 2021-08-25: qty 1

## 2021-08-25 MED ORDER — ONDANSETRON 4 MG PO TBDP: 4.0000 mg | ORAL_TABLET | Freq: Three times a day (TID) | ORAL | 0 refills | Status: DC | PRN

## 2021-08-25 MED ORDER — KETOROLAC TROMETHAMINE 30 MG/ML IJ SOLN
15.0000 mg | Freq: Once | INTRAMUSCULAR | Status: AC
  Administered 2021-08-25: 15 mg via INTRAVENOUS
  Filled 2021-08-25: qty 1

## 2021-08-25 MED ORDER — HYDROMORPHONE HCL 1 MG/ML IJ SOLN
1.0000 mg | Freq: Once | INTRAMUSCULAR | Status: AC
  Administered 2021-08-25: 1 mg via INTRAVENOUS
  Filled 2021-08-25: qty 1

## 2021-08-25 MED ORDER — ONDANSETRON 4 MG PO TBDP
4.0000 mg | ORAL_TABLET | Freq: Once | ORAL | Status: AC
  Administered 2021-08-25: 4 mg via ORAL
  Filled 2021-08-25: qty 1

## 2021-08-25 MED ORDER — IBUPROFEN 600 MG PO TABS: 600.0000 mg | ORAL_TABLET | Freq: Four times a day (QID) | ORAL | 0 refills | Status: DC | PRN

## 2021-08-25 MED ORDER — OXYCODONE-ACETAMINOPHEN 5-325 MG PO TABS: 1.0000 | ORAL_TABLET | Freq: Four times a day (QID) | ORAL | 0 refills | Status: DC | PRN

## 2021-08-25 MED ORDER — SODIUM CHLORIDE 0.9 % IV BOLUS
1000.0000 mL | Freq: Once | INTRAVENOUS | Status: AC
  Administered 2021-08-25: 1000 mL via INTRAVENOUS

## 2021-08-25 MED ORDER — ONDANSETRON HCL 4 MG/2ML IJ SOLN
4.0000 mg | Freq: Once | INTRAMUSCULAR | Status: AC
  Administered 2021-08-25: 4 mg via INTRAVENOUS
  Filled 2021-08-25: qty 2

## 2021-08-25 NOTE — ED Provider Triage Note (Signed)
Emergency Medicine Provider Triage Evaluation Note  [name] , a 46 y.o. female  was evaluated in triage.  Pt complains of flank pain. Hx of kidney stone 10 yrs ago.  Acute onset of R flank pain this AM. Nauseous, blood in urine.  No fever, abd pain  Review of Systems  Positive: As above Negative: As above  Physical Exam  BP (!) 131/95   Pulse 80   Temp 97.7 F (36.5 C) (Oral)   Resp 18   LMP 08/21/2021 (Exact Date)   SpO2 100%  Gen:   Awake, appears uncomfortable Resp:  Normal effort  MSK:   Moves extremities without difficulty  Other:    Medical Decision Making  Medically screening exam initiated at 12:51 PM.  Appropriate orders placed.  [name] was informed that the remainder of the evaluation will be completed by another provider, this initial triage assessment does not replace that evaluation, and the importance of remaining in the ED until their evaluation is complete.  Suspect kidney stone   Domenic Moras, PA-C 08/25/21 1252

## 2021-08-25 NOTE — ED Notes (Signed)
Patient transported to CT 

## 2021-08-25 NOTE — Discharge Instructions (Signed)
Take the pain and nausea medication as prescribed.  Follow-up with the urologist.  Return to the ED for worsening pain, fever, vomiting, not able to eat or drink, not able to urinate or any other concerns

## 2021-08-25 NOTE — ED Provider Notes (Signed)
[facility] DEPT Provider Note   CSN: [id number] Arrival date & time: 08/25/21  1219     History  Chief Complaint  Patient presents with   Flank Pain    right    [name] is a 46 y.o. female.  Patient with right-sided flank pain that onset today around 10 AM.  No fall or trauma.  The pain is to her right flank has become fairly constant with some radiation to the front of her abdomen.  Numerous episodes of nausea and vomiting at home.  Does have some dysuria frequency and urgency with some hematuria but is also on her menstrual cycle.  No fever.  No abdominal pain.  No chest pain or shortness of breath.  Has had a kidney stone about 10 years ago and this feels similar.  Previous appendectomy and cholecystectomy  The history is provided by the patient.  Flank Pain       Home Medications Prior to Admission medications   Medication Sig Start Date End Date Taking? Authorizing Provider  AIMOVIG 140 MG/ML SOAJ SMARTSIG:140 Milligram(s) SUB-Q Once a Month 10/14/20   [provider]  escitalopram (LEXAPRO) 20 MG tablet Take 1 tablet (20 mg total) by mouth daily. 03/25/21   Arfeen, Arlyce Harman, MD  lamoTRIgine (LAMICTAL) 25 MG tablet Take 75 mg by mouth 2 (two) times daily.  06/18/19   Orie Rout, MD  Magnesium 250 MG TABS Take 250 mg by mouth 3 (three) times daily.      [provider]  Omega-3 Fatty Acids (OMEGA 3 PO) Take by mouth.      [provider]  oseltamivir (TAMIFLU) 75 MG capsule Take 1 capsule (75 mg total) by mouth 2 (two) times daily. 01/05/21   Brunetta Jeans, PA-C  SUMAtriptan (IMITREX) 100 MG tablet Take 100 mg by mouth as needed.      [provider]  traZODone (DESYREL) 100 MG tablet Take 1/2 to one tab at bed time. 03/25/21   Arfeen, Arlyce Harman, MD  valACYclovir (VALTREX) 500 MG tablet Take 1 tablet (500 mg total) by mouth daily. 12/29/20   Tamela Gammon, NP  ZOMIG 5 MG nasal solution   05/18/15   [provider]  zonisamide (ZONEGRAN) 100 MG capsule Take 300 mg by mouth daily.     [provider]      Allergies    Compazine and Other    Review of Systems   Review of Systems  Genitourinary:  Positive for flank pain.    Physical Exam Updated Vital Signs BP (!) 149/76 (BP Location: Right Arm)   Pulse 64   Temp 97.7 F (36.5 C) (Oral)   Resp 18   LMP 08/21/2021 (Exact Date)   SpO2 97%  Physical Exam Vitals and nursing note reviewed.  Constitutional:      General: She is not in acute distress.    Appearance: She is well-developed.     Comments: uncomfortable  HENT:     Head: Normocephalic and atraumatic.     Mouth/Throat:     Pharynx: No oropharyngeal exudate.  Eyes:     Conjunctiva/sclera: Conjunctivae normal.     Pupils: Pupils are equal, round, and reactive to light.  Neck:     Comments: No meningismus. Cardiovascular:     Rate and Rhythm: Normal rate and regular rhythm.     Heart sounds: Normal heart sounds. No murmur heard. Pulmonary:     Effort: Pulmonary effort is normal.  No respiratory distress.     Breath sounds: Normal breath sounds.  Abdominal:     Palpations: Abdomen is soft.     Tenderness: There is no abdominal tenderness. There is no guarding or rebound.  Musculoskeletal:        General: Tenderness present. Normal range of motion.     Cervical back: Normal range of motion and neck supple.     Comments: R CVAT  Skin:    General: Skin is warm.  Neurological:     Mental Status: She is alert and oriented to person, place, and time.     Cranial Nerves: No cranial nerve deficit.     Motor: No abnormal muscle tone.     Coordination: Coordination normal.     Comments:  5/5 strength throughout. CN 2-12 intact.Equal grip strength.   Psychiatric:        Behavior: Behavior normal.     ED Results / Procedures / Treatments   Labs (all labs ordered are listed, but only abnormal results are displayed) Labs Reviewed   COMPREHENSIVE METABOLIC PANEL - Abnormal; Notable for the following components:      Result Value   Glucose, Bld 138 (*)    Creatinine, Ser 1.19 (*)    GFR, Estimated 57 (*)    All other components within normal limits  CBC - Abnormal; Notable for the following components:   WBC 10.6 (*)    All other components within normal limits  URINALYSIS, ROUTINE W REFLEX MICROSCOPIC - Abnormal; Notable for the following components:   APPearance TURBID (*)    Hgb urine dipstick MODERATE (*)    RBC / HPF >50 (*)    Bacteria, UA RARE (*)    All other components within normal limits  LIPASE, BLOOD  I-STAT BETA HCG BLOOD, ED ([facility], [facility], [facility] ONLY)    EKG None  Radiology CT Renal Stone Study  Result Date: 08/25/2021 CLINICAL DATA:  Flank pain. EXAM: CT ABDOMEN AND PELVIS WITHOUT CONTRAST TECHNIQUE: Multidetector CT imaging of the abdomen and pelvis was performed following the standard protocol without IV contrast. RADIATION DOSE REDUCTION: This exam was performed according to the departmental dose-optimization program which includes automated exposure control, adjustment of the mA and/or kV according to patient size and/or use of iterative reconstruction technique. COMPARISON:  CT abdomen and pelvis 08/23/2005 FINDINGS: Lower chest: No acute abnormality. Hepatobiliary: No focal liver abnormality is seen. Status post cholecystectomy. No biliary dilatation. Pancreas: Unremarkable. No pancreatic ductal dilatation or surrounding inflammatory changes. Spleen: Normal in size without focal abnormality. Adrenals/Urinary Tract: There is a 2 mm calculus at the right ureterovesicular junction. There is mild right-sided hydronephrosis with perinephric fat stranding. There is an additional 3 mm calculus in the superior pole the right kidney. The adrenal glands, left kidney and bladder are within normal limits. Stomach/Bowel: There surgical changes in the right colon. The appendix is not visualized. There is no bowel  obstruction, wall thickening, inflammation or free air. Stomach is within normal limits. Vascular/Lymphatic: No significant vascular findings are present. No enlarged abdominal or pelvic lymph nodes. Reproductive: Uterus and bilateral adnexa are unremarkable. Other: Small fat containing umbilical hernia.  No free fluid. Musculoskeletal: No acute or significant osseous findings. IMPRESSION: 1. 2 mm calculus at the right ureterovesicular junction with resultant mild obstructive uropathy. 2. Additional nonobstructing right renal calculus. Electronically Signed   By: Ronney Asters M.D.   On: 08/25/2021 15:07    Procedures Procedures    Medications Ordered in ED Medications  sodium chloride 0.9 % bolus 1,000 mL (has no administration in time range)  ondansetron (ZOFRAN) injection 4 mg (has no administration in time range)  HYDROmorphone (DILAUDID) injection 1 mg (has no administration in time range)  ketorolac (TORADOL) 30 MG/ML injection 15 mg (has no administration in time range)  HYDROmorphone (DILAUDID) injection 1 mg (1 mg Intramuscular Given 08/25/21 1256)  ondansetron (ZOFRAN-ODT) disintegrating tablet 4 mg (4 mg Oral Given 08/25/21 1256)    ED Course/ Medical Decision Making/ A&P                           Medical Decision Making Amount and/or Complexity of Data Reviewed Labs: ordered. Decision-making details documented in ED Course. Radiology: ordered and independent interpretation performed. Decision-making details documented in ED Course. ECG/medicine tests: ordered and independent interpretation performed. Decision-making details documented in ED Course.  Risk Prescription drug management.  Right-sided flank pain progressively worsening since 10 AM.  Associate with nausea and vomiting feels similar to previous kidney stones. Afebrile, vital stable, no distress, urinalysis shows hematuria without any obvious infection.  Urinalysis shows blood without infection.  Patient's pain and  nausea have improved in the ED.  CT scan shows 2 mm distal UVJ stone.  Results reviewed and interpreted by me  Patient creatinine 1.2 which appears to be near her baseline.  Pain and nausea have improved and she is tolerating p.o.  Appears stable for outpatient follow-up with urology.  Will give symptomatic control.  Return to the ED for worsening pain, fever, vomiting, not able to eat or drink, unable to urinate or any other concerns        Final Clinical Impression(s) / ED Diagnoses Final diagnoses:  Kidney stone    Rx / DC Orders ED Discharge Orders     None         Ardell Aaronson, Annie Main, MD 08/25/21 [phone]

## 2021-08-27 DIAGNOSIS — M25571 Pain in right ankle and joints of right foot: Secondary | ICD-10-CM | POA: Diagnosis not present

## 2021-09-17 ENCOUNTER — Telehealth: Payer: Self-pay | Admitting: *Deleted

## 2021-09-17 ENCOUNTER — Other Ambulatory Visit (HOSPITAL_COMMUNITY): Payer: Self-pay | Admitting: Psychiatry

## 2021-09-17 DIAGNOSIS — F419 Anxiety disorder, unspecified: Secondary | ICD-10-CM

## 2021-09-17 DIAGNOSIS — F330 Major depressive disorder, recurrent, mild: Secondary | ICD-10-CM

## 2021-09-17 NOTE — Chronic Care Management (AMB) (Unsigned)
  Care Coordination  Outreach Note  09/17/2021 Name: [name] MRN: [id number] DOB: November 28, 1975   Care Coordination Outreach Attempts  An unsuccessful telephone outreach was attempted today to offer the patient information about available care coordination services as a benefit of their health plan.   Follow Up Plan:  Additional outreach attempts will be made to offer the patient care coordination information and services.   Encounter Outcome:  No Answer  [place]  Direct Dial: [phone]

## 2021-09-22 DIAGNOSIS — G518 Other disorders of facial nerve: Secondary | ICD-10-CM | POA: Diagnosis not present

## 2021-09-22 DIAGNOSIS — G43019 Migraine without aura, intractable, without status migrainosus: Secondary | ICD-10-CM | POA: Diagnosis not present

## 2021-09-22 DIAGNOSIS — M791 Myalgia, unspecified site: Secondary | ICD-10-CM | POA: Diagnosis not present

## 2021-09-22 DIAGNOSIS — M542 Cervicalgia: Secondary | ICD-10-CM | POA: Diagnosis not present

## 2021-09-22 NOTE — Chronic Care Management (AMB) (Signed)
  Care Coordination   Note   09/22/2021 Name: [name] MRN: [id number] DOB: [birth year]  [name] is a 46 y.o. year old female who sees Velna Hatchet, MD for primary care. I reached out to [place] by phone today to offer care coordination services.  Ms. Hast was given information about Care Coordination services today including:   The Care Coordination services include support from the care team which includes your Nurse Coordinator, Clinical Social Worker, or Pharmacist.  The Care Coordination team is here to help remove barriers to the health concerns and goals most important to you. Care Coordination services are voluntary, and the patient may decline or stop services at any time by request to their care team member.   Care Coordination Consent Status: Patient did not agree to participate in care coordination services at this time.    Encounter Outcome:  Pt. Refused  [place]  Direct Dial: [phone]

## 2021-09-28 ENCOUNTER — Telehealth (HOSPITAL_BASED_OUTPATIENT_CLINIC_OR_DEPARTMENT_OTHER): Payer: BC Managed Care – PPO | Admitting: Psychiatry

## 2021-09-28 ENCOUNTER — Encounter (HOSPITAL_COMMUNITY): Payer: Self-pay | Admitting: Psychiatry

## 2021-09-28 DIAGNOSIS — F330 Major depressive disorder, recurrent, mild: Secondary | ICD-10-CM

## 2021-09-28 DIAGNOSIS — F419 Anxiety disorder, unspecified: Secondary | ICD-10-CM | POA: Diagnosis not present

## 2021-09-28 MED ORDER — ESCITALOPRAM OXALATE 20 MG PO TABS: 20.0000 mg | ORAL_TABLET | Freq: Every day | ORAL | 1 refills | Status: DC

## 2021-09-28 MED ORDER — TRAZODONE HCL 50 MG PO TABS
50.0000 mg | ORAL_TABLET | Freq: Every day | ORAL | 1 refills | Status: DC
Start: 2021-09-28 — End: 2022-02-28

## 2021-09-28 NOTE — Progress Notes (Addendum)
Virtual Visit via Telephone Note  I connected with [name] on 09/28/21 at  4:00 PM EDT by telephone and verified that I am speaking with the correct person using two identifiers.  Location: Patient: In Car Provider: Home Office   I discussed the limitations, risks, security and privacy concerns of performing an evaluation and management service by telephone and the availability of in person appointments. I also discussed with the patient that there may be a patient responsible charge related to this service. The patient expressed understanding and agreed to proceed.   History of Present Illness: Patient is evaluated by phone session.  Recently she had a visit to the emergency room for kidney stone.  She was prescribed pain medicine but she is no longer taking it.  She is trying to hydrate herself.  She is doing well on his medication.  She started taking Lamictal for migraine headaches prescribed by neurology and that also helps her depression.  Her rage level is good.  Patient told that she has been very busy at work and taken a week off just to get rest few weeks ago.  She is doing CrossFit regularly.  Her appetite is okay.  Her energy level is good.  She denies any panic attack or any crying spells.  She is still in therapy with [name] as needed and usually once in 3 months she tried to visit [name].  She had a good support from her parents who live close by.  Patient wants to keep the trazodone and Lexapro.  She has no tremors, shakes or any EPS.   Past Psychiatric History:  H/O depression since teens. Tried Paxil, Lunesta and briefly Wellbutrin.  Good response with Cymbalta until stopped working.  No h/o suicidal attempt, psychiatric inpatient treatment, psychosis or self abusive behavior.    Psychiatric Specialty Exam: Physical Exam  Review of Systems  Weight 200 lb (90.7 kg).There is no height or weight on file to calculate BMI.  General Appearance: NA  Eye Contact:  NA   Speech:  Clear and Coherent and Normal Rate  Volume:  Normal  Mood:  Euthymic  Affect:  NA  Thought Process:  Goal Directed  Orientation:  Full (Time, Place, and Person)  Thought Content:  Logical  Suicidal Thoughts:  No  Homicidal Thoughts:  No  Memory:  Immediate;   Good Recent;   Good Remote;   Good  Judgement:  Good  Insight:  Present  Psychomotor Activity:  Normal  Concentration:  Concentration: Good and Attention Span: Good  Recall:  Good  Fund of Knowledge:  Good  Language:  Good  Akathisia:  No  Handed:  Right  AIMS (if indicated):     Assets:  Communication Skills Desire for Improvement Housing Resilience Social Support Talents/Skills Transportation  ADL's:  Intact  Cognition:  WNL  Sleep:   ok      Assessment and Plan: Major depressive disorder, recurrent.  Anxiety.  Patient is stable on her current medication.  Continue Lexapro 20 mg daily and trazodone 50 mg at bedtime for insomnia.  Recommended to call us back if she has any question or any concern.  Follow-up in 6 months.  I reviewed blood work results.  She is no longer taking narcotic pain medication.  Follow Up Instructions:    I discussed the assessment and treatment plan with the patient. The patient was provided an opportunity to ask questions and all were answered. The patient agreed with the plan and demonstrated an  understanding of the instructions.   The patient was advised to call back or seek an in-person evaluation if the symptoms worsen or if the condition fails to improve as anticipated.  Collaboration of Care: Primary Care Provider AEB notes are available in epic to review.  Patient/Guardian was advised Release of Information must be obtained prior to any record release in order to collaborate their care with an outside provider. Patient/Guardian was advised if they have not already done so to contact the registration department to sign all necessary forms in order for [place] to release  information regarding their care.   Consent: Patient/Guardian gives verbal consent for treatment and assignment of benefits for services provided during this visit. Patient/Guardian expressed understanding and agreed to proceed.    I provided 12th minutes of non-face-to-face time during this encounter.   Kathlee Nations, MD

## 2021-10-29 ENCOUNTER — Other Ambulatory Visit: Payer: Self-pay | Admitting: Nurse Practitioner

## 2021-11-01 NOTE — Telephone Encounter (Signed)
Medication refill request: valtrex '500mg'$  Last AEX:  11-23-20 Next AEX: 11-24-21 Last MMG (if hormonal medication request): n/a Refill authorized: please approve until appointment if appropriate.

## 2021-11-02 DIAGNOSIS — M791 Myalgia, unspecified site: Secondary | ICD-10-CM | POA: Diagnosis not present

## 2021-11-02 DIAGNOSIS — G43719 Chronic migraine without aura, intractable, without status migrainosus: Secondary | ICD-10-CM | POA: Diagnosis not present

## 2021-11-02 DIAGNOSIS — M542 Cervicalgia: Secondary | ICD-10-CM | POA: Diagnosis not present

## 2021-11-02 DIAGNOSIS — G518 Other disorders of facial nerve: Secondary | ICD-10-CM | POA: Diagnosis not present

## 2021-11-24 ENCOUNTER — Encounter: Payer: Self-pay | Admitting: Nurse Practitioner

## 2021-11-24 ENCOUNTER — Ambulatory Visit (INDEPENDENT_AMBULATORY_CARE_PROVIDER_SITE_OTHER): Payer: BC Managed Care – PPO | Admitting: Nurse Practitioner

## 2021-11-24 VITALS — BP 128/84 | HR 74 | Ht 67.5 in | Wt 222.0 lb

## 2021-11-24 DIAGNOSIS — Z01419 Encounter for gynecological examination (general) (routine) without abnormal findings: Secondary | ICD-10-CM

## 2021-11-24 DIAGNOSIS — N951 Menopausal and female climacteric states: Secondary | ICD-10-CM

## 2021-11-24 DIAGNOSIS — N921 Excessive and frequent menstruation with irregular cycle: Secondary | ICD-10-CM | POA: Diagnosis not present

## 2021-11-24 NOTE — Progress Notes (Unsigned)
[name] [facility] 09/09/1975 [id number]   History:  46 y.o. G0 presents for annual exam. Cycles are starting to spread out ranging from 28-45 days and have become heavier. Bleeding is heavy first 3 days, sometimes requiring changing of super plus tampons every 3 hours. Mild menopausal symptoms. On Lamictal. Normal pap history. History of chronic vulvitis -  has not needed clobetasol for a while. Migraines managed by neurology, depression/anxiety managed by psychiatry.   Gynecologic History Patient's last menstrual period was 11/04/2021 (exact date). Period Cycle (Days):  (28-45) Period Duration (Days): 4 Menstrual Flow:  (heavy first two days then lightens up) Menstrual Control: Maxi pad, Tampon Dysmenorrhea: (!) Moderate Dysmenorrhea Symptoms: Cramping (hot flashes, have tapered off) Contraception/Family planning: none Sexually active: Yes, declines STD screening  Health Maintenance Last Pap: 11/13/2018. Results were: Normal neg HPV Last mammogram: 04/22/2021. Results were: Normal Last colonoscopy: 07/2021. Results were: 5-year recall d/t family history Last Dexa: Not indicated  Past medical history, past surgical history, family history and social history were all reviewed and documented in the EPIC chart. 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   ROS:  A ROS was performed and pertinent positives and negatives are included.  Exam:  Vitals:   11/24/21 1608  BP: 128/84  Pulse: 74  SpO2: 98%  Weight: 222 lb (100.7 kg)  Height: 5' 7.5" (1.715 m)    Body mass index is 34.26 kg/m.  General appearance:  Normal Thyroid:  Symmetrical, normal in size, without palpable masses or nodularity. Respiratory  Auscultation:  Clear without wheezing or rhonchi Cardiovascular  Auscultation:  Regular rate, without rubs, murmurs or gallops  Edema/varicosities:  Not grossly evident Abdominal  Soft,nontender, without masses, guarding or  rebound.  Liver/spleen:  No organomegaly noted  Hernia:  None appreciated  Skin  Inspection:  Grossly normal Breasts: Examined lying and sitting.   Right: Without masses, retractions, nipple discharge or axillary adenopathy.   Left: Without masses, retractions, nipple discharge or axillary adenopathy. Genitourinary   Inguinal/mons:  Normal without inguinal adenopathy  External genitalia:  Normal appearing vulva with no masses, tenderness, or lesions  BUS/Urethra/Skene's glands:  Normal  Vagina:  Normal appearing with normal color and discharge, no lesions  Cervix:  Normal appearing without discharge or lesions  Uterus:  Normal in size, shape and contour.  Midline and mobile, nontender  Adnexa/parametria:     Rt: Normal in size, without masses or tenderness.   Lt: Normal in size, without masses or tenderness.  Anus and perineum: Normal  Patient informed chaperone available to be present for breast and pelvic exam. Patient has requested no chaperone to be present. Patient has been advised what will be completed during breast and pelvic exam.   Assessment/Plan:  46 y.o. G0 for annual exam.   Well female exam with routine gynecological exam - Education provided on SBEs, importance of preventative screenings, current guidelines, high calcium diet, regular exercise, and multivitamin daily. Labs with PCP.   Perimenopause - cycles are starting to become slightly irregular ranging from 28-45 days. Mild menopausal symptoms. Discussed what to expect during perimenopause.   Menorrhagia with irregular cycle - POPs versus Lysteda. Ibuprofen 600-800 mg every 8 hours during heaviest days may decrease bleeding by up to 40%. She expressed concern with using hormonal contraception with Lamictal. Reassured that progestin-only contraception has not been found to alter Lamictal levels but she may also discuss with psychiatry.   Screening for cervical cancer - Normal Pap history.  Will repeat at 5-year  interval per guidelines.  Screening for breast cancer - Normal mammogram history.  Continue annual screenings.  Normal breast exam today.  Screening for colon cancer - July 2023 colonoscopy. Will repeat in 5-year per GI recommendation.  Return in 1 year for annual.     Tamela Gammon DNP, 8:20 AM 11/25/2021

## 2021-11-25 ENCOUNTER — Encounter: Payer: Self-pay | Admitting: Nurse Practitioner

## 2021-12-28 ENCOUNTER — Telehealth (HOSPITAL_COMMUNITY): Payer: BC Managed Care – PPO | Admitting: Psychiatry

## 2022-01-20 DIAGNOSIS — G43719 Chronic migraine without aura, intractable, without status migrainosus: Secondary | ICD-10-CM | POA: Diagnosis not present

## 2022-01-20 DIAGNOSIS — M791 Myalgia, unspecified site: Secondary | ICD-10-CM | POA: Diagnosis not present

## 2022-01-20 DIAGNOSIS — M542 Cervicalgia: Secondary | ICD-10-CM | POA: Diagnosis not present

## 2022-01-20 DIAGNOSIS — G518 Other disorders of facial nerve: Secondary | ICD-10-CM | POA: Diagnosis not present

## 2022-02-28 ENCOUNTER — Telehealth (HOSPITAL_BASED_OUTPATIENT_CLINIC_OR_DEPARTMENT_OTHER): Payer: BC Managed Care – PPO | Admitting: Psychiatry

## 2022-02-28 ENCOUNTER — Encounter (HOSPITAL_COMMUNITY): Payer: Self-pay | Admitting: Psychiatry

## 2022-02-28 DIAGNOSIS — F330 Major depressive disorder, recurrent, mild: Secondary | ICD-10-CM

## 2022-02-28 DIAGNOSIS — F419 Anxiety disorder, unspecified: Secondary | ICD-10-CM | POA: Diagnosis not present

## 2022-02-28 MED ORDER — ESCITALOPRAM OXALATE 20 MG PO TABS: 20.0000 mg | ORAL_TABLET | Freq: Every day | ORAL | 1 refills | Status: DC

## 2022-02-28 MED ORDER — TRAZODONE HCL 50 MG PO TABS
50.0000 mg | ORAL_TABLET | Freq: Every day | ORAL | 1 refills | Status: DC
Start: 2022-02-28 — End: 2022-07-25

## 2022-02-28 NOTE — Progress Notes (Signed)
Virtual Visit via Telephone Note  I connected with [name] on 02/28/22 at  1:20 PM EST by telephone and verified that I am speaking with the correct person using two identifiers.  Location: Patient: Work Provider: Biomedical scientist   I discussed the limitations, risks, security and privacy concerns of performing an evaluation and management service by telephone and the availability of in person appointments. I also discussed with the patient that there may be a patient responsible charge related to this service. The patient expressed understanding and agreed to proceed.   History of Present Illness: Patient is evaluated by phone session.  She is at work and could not log and for the video session.  She reported job is very stressful and working some days 10 to 12 hours a day.  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  She has a COVID 4 weeks ago but she is recovered now.  She admitted not walking, exercising and not watching her diet very well.  She gained 15 pounds and not happy about her weight gain.  She also notices usually winter, holidays and darker days does not help her depression and she is hoping once the weather is more warm she can go back to her exercise.  She is taking the trazodone at bedtime and Lexapro to help her depression.  She also taking Lamictal to help her migraines but also helping her mood.  Patient told she started a relationship 4 months ago and that is going well.  She has not seen her therapist in a while but realized if needed she can always connect with her therapist [name].  Patient denies any hallucination, paranoia, suicidal thoughts.  She denies any anger.  She wants to keep the current medication.  Past Psychiatric History:  H/O depression since teens. Tried Paxil, Lunesta and briefly Wellbutrin.  Good response with Cymbalta until stopped working.  No h/o suicidal attempt, psychiatric inpatient treatment,  psychosis or self abusive behavior.    Psychiatric Specialty Exam: Physical Exam  Review of Systems  Weight 215 lb (97.5 kg).There is no height or weight on file to calculate BMI.  General Appearance: NA  Eye Contact:  NA  Speech:  Clear and Coherent and Normal Rate  Volume:  Normal  Mood:  Anxious  Affect:  NA  Thought Process:  Goal Directed  Orientation:  Full (Time, Place, and Person)  Thought Content:  Rumination  Suicidal Thoughts:  No  Homicidal Thoughts:  No  Memory:  Immediate;   Good Recent;   Good Remote;   Good  Judgement:  Good  Insight:  Present  Psychomotor Activity:  NA  Concentration:  Concentration: Good and Attention Span: Good  Recall:  Good  Fund of Knowledge:  Good  Language:  Good  Akathisia:  No  Handed:  Right  AIMS (if indicated):     Assets:  Communication Skills Desire for Improvement Housing Talents/Skills Transportation  ADL's:  Intact  Cognition:  WNL  Sleep:   ok      Assessment and Plan: Major depressive disorder, recurrent.  Anxiety.  Patient is stable on her current medication however noticed since not doing exercise, watching her calorie intake has noticed more sad days.  She is hoping once the weather gets better she going to resume her exercise.  She started relationship which is going well.  She has no issues with the medication.  Discussed medication side effects and benefits.  Continue Lexapro 20 mg  daily and trazodone 50 mg at bedtime.  She also takes Lamictal for migraine.  Recommended to call us back if she is any question or any concern.  Follow-up in 6 months.  Follow Up Instructions:    I discussed the assessment and treatment plan with the patient. The patient was provided an opportunity to ask questions and all were answered. The patient agreed with the plan and demonstrated an understanding of the instructions.   The patient was advised to call back or seek an in-person evaluation if the symptoms worsen or if the  condition fails to improve as anticipated.  Collaboration of Care: Other provider involved in patient's care AEB notes are available in epic to review.  Patient/Guardian was advised Release of Information must be obtained prior to any record release in order to collaborate their care with an outside provider. Patient/Guardian was advised if they have not already done so to contact the registration department to sign all necessary forms in order for [place] to release information regarding their care.   Consent: Patient/Guardian gives verbal consent for treatment and assignment of benefits for services provided during this visit. Patient/Guardian expressed understanding and agreed to proceed.    I provided 15 minutes of non-face-to-face time during this encounter.   Kathlee Nations, MD

## 2022-03-03 DIAGNOSIS — M542 Cervicalgia: Secondary | ICD-10-CM | POA: Diagnosis not present

## 2022-03-03 DIAGNOSIS — G43719 Chronic migraine without aura, intractable, without status migrainosus: Secondary | ICD-10-CM | POA: Diagnosis not present

## 2022-03-03 DIAGNOSIS — G518 Other disorders of facial nerve: Secondary | ICD-10-CM | POA: Diagnosis not present

## 2022-03-03 DIAGNOSIS — M791 Myalgia, unspecified site: Secondary | ICD-10-CM | POA: Diagnosis not present

## 2022-03-09 DIAGNOSIS — L821 Other seborrheic keratosis: Secondary | ICD-10-CM | POA: Diagnosis not present

## 2022-03-09 DIAGNOSIS — D225 Melanocytic nevi of trunk: Secondary | ICD-10-CM | POA: Diagnosis not present

## 2022-03-09 DIAGNOSIS — L718 Other rosacea: Secondary | ICD-10-CM | POA: Diagnosis not present

## 2022-03-09 DIAGNOSIS — L720 Epidermal cyst: Secondary | ICD-10-CM | POA: Diagnosis not present

## 2022-03-24 ENCOUNTER — Telehealth (HOSPITAL_COMMUNITY): Payer: Self-pay | Admitting: Psychiatry

## 2022-03-24 DIAGNOSIS — F4323 Adjustment disorder with mixed anxiety and depressed mood: Secondary | ICD-10-CM | POA: Diagnosis not present

## 2022-03-24 NOTE — Telephone Encounter (Signed)
Patient called in stating that you had told her if she is doing worse to reach out to you and after speaking with her therapist, they advised her to speak with you.

## 2022-03-28 DIAGNOSIS — F4323 Adjustment disorder with mixed anxiety and depressed mood: Secondary | ICD-10-CM | POA: Diagnosis not present

## 2022-03-28 NOTE — Telephone Encounter (Signed)
I returned patient's phone call.  She told having bad thoughts after a therapy session with [name].  She was having passive repeating suicidal thoughts but not anymore.  No specific triggers other than she feels not taking care of herself very well and worn out.  Her therapist decided to see her every week and she feels somewhat better.  I encourage no change in medication for now however after 2 sessions do not see more improvement than she need to give [place] a call back to schedule appointment sooner.  Patient agreed with the plan.  Discuss safety concerns and any time having active suicidal thoughts or homicidal halogen need to call 911 or go to local emergency room.

## 2022-04-05 DIAGNOSIS — F4323 Adjustment disorder with mixed anxiety and depressed mood: Secondary | ICD-10-CM | POA: Diagnosis not present

## 2022-04-13 DIAGNOSIS — F332 Major depressive disorder, recurrent severe without psychotic features: Secondary | ICD-10-CM | POA: Diagnosis not present

## 2022-04-14 DIAGNOSIS — M542 Cervicalgia: Secondary | ICD-10-CM | POA: Diagnosis not present

## 2022-04-14 DIAGNOSIS — G43719 Chronic migraine without aura, intractable, without status migrainosus: Secondary | ICD-10-CM | POA: Diagnosis not present

## 2022-04-14 DIAGNOSIS — M791 Myalgia, unspecified site: Secondary | ICD-10-CM | POA: Diagnosis not present

## 2022-04-14 DIAGNOSIS — G518 Other disorders of facial nerve: Secondary | ICD-10-CM | POA: Diagnosis not present

## 2022-04-18 ENCOUNTER — Other Ambulatory Visit: Payer: Self-pay | Admitting: Nurse Practitioner

## 2022-04-18 NOTE — Telephone Encounter (Signed)
Med refill request: Valtrex 500 mg tab po daily Last AEX: 11/24/21/ TW Next AEX:11/29/22/ TW Last MMG (if hormonal med) N/A  Rx pended for #90/1RF Refill authorized: Please Advise?

## 2022-04-21 DIAGNOSIS — F332 Major depressive disorder, recurrent severe without psychotic features: Secondary | ICD-10-CM | POA: Diagnosis not present

## 2022-04-27 DIAGNOSIS — F332 Major depressive disorder, recurrent severe without psychotic features: Secondary | ICD-10-CM | POA: Diagnosis not present

## 2022-05-04 DIAGNOSIS — F332 Major depressive disorder, recurrent severe without psychotic features: Secondary | ICD-10-CM | POA: Diagnosis not present

## 2022-05-11 DIAGNOSIS — F332 Major depressive disorder, recurrent severe without psychotic features: Secondary | ICD-10-CM | POA: Diagnosis not present

## 2022-05-18 DIAGNOSIS — F332 Major depressive disorder, recurrent severe without psychotic features: Secondary | ICD-10-CM | POA: Diagnosis not present

## 2022-05-24 DIAGNOSIS — G43719 Chronic migraine without aura, intractable, without status migrainosus: Secondary | ICD-10-CM | POA: Diagnosis not present

## 2022-05-24 DIAGNOSIS — G518 Other disorders of facial nerve: Secondary | ICD-10-CM | POA: Diagnosis not present

## 2022-05-24 DIAGNOSIS — Z1339 Encounter for screening examination for other mental health and behavioral disorders: Secondary | ICD-10-CM | POA: Diagnosis not present

## 2022-05-24 DIAGNOSIS — E785 Hyperlipidemia, unspecified: Secondary | ICD-10-CM | POA: Diagnosis not present

## 2022-05-24 DIAGNOSIS — D6489 Other specified anemias: Secondary | ICD-10-CM | POA: Diagnosis not present

## 2022-05-24 DIAGNOSIS — M791 Myalgia, unspecified site: Secondary | ICD-10-CM | POA: Diagnosis not present

## 2022-05-24 DIAGNOSIS — Z1331 Encounter for screening for depression: Secondary | ICD-10-CM | POA: Diagnosis not present

## 2022-05-24 DIAGNOSIS — R7989 Other specified abnormal findings of blood chemistry: Secondary | ICD-10-CM | POA: Diagnosis not present

## 2022-05-24 DIAGNOSIS — Z0000 Encounter for general adult medical examination without abnormal findings: Secondary | ICD-10-CM | POA: Diagnosis not present

## 2022-05-24 DIAGNOSIS — M542 Cervicalgia: Secondary | ICD-10-CM | POA: Diagnosis not present

## 2022-05-25 DIAGNOSIS — F332 Major depressive disorder, recurrent severe without psychotic features: Secondary | ICD-10-CM | POA: Diagnosis not present

## 2022-06-02 DIAGNOSIS — F332 Major depressive disorder, recurrent severe without psychotic features: Secondary | ICD-10-CM | POA: Diagnosis not present

## 2022-06-08 DIAGNOSIS — F332 Major depressive disorder, recurrent severe without psychotic features: Secondary | ICD-10-CM | POA: Diagnosis not present

## 2022-06-15 DIAGNOSIS — F332 Major depressive disorder, recurrent severe without psychotic features: Secondary | ICD-10-CM | POA: Diagnosis not present

## 2022-06-22 DIAGNOSIS — F332 Major depressive disorder, recurrent severe without psychotic features: Secondary | ICD-10-CM | POA: Diagnosis not present

## 2022-06-24 ENCOUNTER — Encounter (HOSPITAL_COMMUNITY): Payer: Self-pay | Admitting: Psychiatry

## 2022-06-24 ENCOUNTER — Telehealth (HOSPITAL_BASED_OUTPATIENT_CLINIC_OR_DEPARTMENT_OTHER): Payer: BC Managed Care – PPO | Admitting: Psychiatry

## 2022-06-24 VITALS — Wt 215.0 lb

## 2022-06-24 DIAGNOSIS — F330 Major depressive disorder, recurrent, mild: Secondary | ICD-10-CM

## 2022-06-24 DIAGNOSIS — F419 Anxiety disorder, unspecified: Secondary | ICD-10-CM | POA: Diagnosis not present

## 2022-06-24 MED ORDER — LAMOTRIGINE 100 MG PO TABS: 100.0000 mg | ORAL_TABLET | Freq: Two times a day (BID) | ORAL | 0 refills | Status: DC

## 2022-06-24 NOTE — Progress Notes (Signed)
[facility] MD Virtual Progress Note   Patient Location: Work Provider Location: Home Office  I connect with patient by video and verified that I am speaking with correct person by using two identifiers. I discussed the limitations of evaluation and management by telemedicine and the availability of in person appointments. I also discussed with the patient that there may be a patient responsible charge related to this service. The patient expressed understanding and agreed to proceed.  [name] [id number] 47 y.o.  06/24/2022 11:27 AM  History of Present Illness:  Patient is evaluated by video session.  She requested an earlier appointment because she had a worst depressive episode started in March.  Patient had called earlier few weeks ago complaining of severe depression.  Patient reported usually she has episodes of depression but this was the worst and it last longer.  She has to take 2 weeks time off because she could not function.  She feels these episodes are now more stronger, lasting long and sometimes she does not wish to her baseline.  She struggled with energy, lifting her mood.  She admitted there are times when she has passive suicidal thoughts and feels that she does not exist but denies any active suicidal thoughts, paranoia, hallucination.  She reported her job is challenging but manageable.  She is now in a relationship for the past 6 months which she believes it is supportive and helpful.  She does walk every day with the dog but has not been running which she used to.  Her appetite is okay.  She is sleeps fine.  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  She reported some stress but able to handle.  She reported there are times when she feels fatigue, crying spells, decrease in her self care.  She is now seeing a call every week.  Her therapist recommended to discuss medication adjustment or consider TMS.  She reported going through perimenopause  does not help either.  She is having migraine headaches but she is taking multiple medication and frequency and intensity has been reduced but is still have at least 4-5 episodes in a week.  She denies any mania or psychosis.  In the past she had tried Cymbalta, Prozac, Paxil, Wellbutrin.  She is taking Lexapro 20 mg.  She also taking Lamictal [facility].  She has no tremors of rash or any itching.  Past Psychiatric History: H/O depression since teens. Tried Paxil, Lunesta and briefly Wellbutrin.  Good response with Cymbalta until stopped working.  No h/o suicidal attempt, psychiatric inpatient treatment, psychosis or self abusive behavior.     Outpatient Encounter Medications as of 06/24/2022  Medication Sig   AIMOVIG 140 MG/ML SOAJ Inject 140 mg into the skin every 30 (thirty) days.   desonide (DESOWEN) 0.05 % cream 1 application Externally Twice a day to eyelids and corners of mouth   doxycycline (MONODOX) 100 MG capsule Take 100 mg by mouth daily.   escitalopram (LEXAPRO) 20 MG tablet Take 1 tablet (20 mg total) by mouth daily.   fexofenadine (ALLEGRA) 180 MG tablet Take 180 mg by mouth daily.   lamoTRIgine (LAMICTAL) 25 MG tablet Take 75 mg by mouth 2 (two) times daily.    Magnesium 250 MG TABS Take 250 mg by mouth at bedtime.   rosuvastatin (CRESTOR) 10 MG tablet Take 10 mg by mouth at bedtime.   SUMAtriptan (IMITREX) 100 MG tablet Take 100 mg by mouth every  2 (two) hours as needed for migraine or headache.   traZODone (DESYREL) 50 MG tablet Take 1 tablet (50 mg total) by mouth at bedtime.   valACYclovir (VALTREX) 500 MG tablet TAKE 1 TABLET (500 MG TOTAL) BY MOUTH DAILY.   ZOMIG 5 MG nasal solution Place 1 spray into the nose daily as needed for migraine.   No facility-administered encounter medications on file as of 06/24/2022.    No results found for this or any previous visit (from the past 2160 hour(s)).   Psychiatric Specialty Exam: Physical Exam   Review of Systems  Psychiatric/Behavioral:  Positive for dysphoric mood.     Weight 215 lb (97.5 kg).There is no height or weight on file to calculate BMI.  General Appearance: Casual  Eye Contact:  Good  Speech:  Slow  Volume:  Decreased  Mood:  Depressed and Dysphoric  Affect:  Constricted and Depressed  Thought Process:  Goal Directed  Orientation:  Full (Time, Place, and Person)  Thought Content:  Rumination  Suicidal Thoughts:  No  Homicidal Thoughts:  No  Memory:  Immediate;   Good Recent;   Good Remote;   Good  Judgement:  Intact  Insight:  Present  Psychomotor Activity:  Decreased  Concentration:  Concentration: Good and Attention Span: Good  Recall:  Good  Fund of Knowledge:  Good  Language:  Good  Akathisia:  No  Handed:  Right  AIMS (if indicated):     Assets:  Communication Skills Desire for Improvement Housing Resilience Social Support Talents/Skills Transportation  ADL's:  Intact  Cognition:  WNL  Sleep:  ok     Assessment/Plan: Major depressive disorder, recurrent episode, mild (HCC) - Plan: lamoTRIgine (LAMICTAL) 100 MG tablet  Anxiety - Plan: lamoTRIgine (LAMICTAL) 100 MG tablet  I reviewed psychosocial stressors, collateral information, current medication and depressive episodes which are now more intense, last long and debilitating.  She took 2 weeks off from the work and trying to change her lifestyle with work life balance.  She is seeing therapist once a week.  We discussed medication options.  In the past she had tried Cymbalta with good response up to 90 mg and it stopped working.  She tried Paxil but stopped for the same reason that stopped working.  She tried briefly Prozac that did not work and Wellbutrin did not help.  She is taking Lamictal 75 mg twice a day prescribed by Dr. Neale Burly for headaches.  She also taking multiple other medication for headaches.  Though her headaches are less intense but is still exist.  We discussed medication  adjustment options.  I recommend to optimize the Lamictal to take 200 mg a day to help the depressive episodes.  The other option is to consider Abilify and new medication like Trintellix, Viibryd.  I also recommend to consider Genes[facility] testt to help her navigate about treatment plan.  I recommend there are other options like TMS, ketamine infusion which are available but trying medication adjustment first.  She agreed with the plan.  After some discussion she agreed to try the Lamictal 200 mg daily and she will come to our office for [place] testing.  If increase Lamictal did not help then we will consider Abilify after reviewing the genetic results.  Patient agreed with the plan.  I also encourage regular walking, exercise that has been helpful in the past.  Encouraged to continue therapy appointment once a week.  Follow-up in 1 month.  Follow Up Instructions:  I discussed the assessment and treatment plan with the patient. The patient was provided an opportunity to ask questions and all were answered. The patient agreed with the plan and demonstrated an understanding of the instructions.   The patient was advised to call back or seek an in-person evaluation if the symptoms worsen or if the condition fails to improve as anticipated.    Collaboration of Care: Other provider involved in patient's care AEB available in epic to review.  Patient/Guardian was advised Release of Information must be obtained prior to any record release in order to collaborate their care with an outside provider. Patient/Guardian was advised if they have not already done so to contact the registration department to sign all necessary forms in order for [place] to release information regarding their care.   Consent: Patient/Guardian gives verbal consent for treatment and assignment of benefits for services provided during this visit. Patient/Guardian expressed understanding and agreed to proceed.     I provided 32 minutes  of non face to face time during this encounter.  Note: This document was prepared by [employer] voice dictation technology and any errors that results from this process are unintentional.    Cleotis Nipper, MD 06/24/2022

## 2022-06-29 DIAGNOSIS — F332 Major depressive disorder, recurrent severe without psychotic features: Secondary | ICD-10-CM | POA: Diagnosis not present

## 2022-07-04 ENCOUNTER — Ambulatory Visit (HOSPITAL_COMMUNITY): Payer: BC Managed Care – PPO

## 2022-07-04 DIAGNOSIS — F330 Major depressive disorder, recurrent, mild: Secondary | ICD-10-CM

## 2022-07-04 DIAGNOSIS — M791 Myalgia, unspecified site: Secondary | ICD-10-CM | POA: Diagnosis not present

## 2022-07-04 DIAGNOSIS — F331 Major depressive disorder, recurrent, moderate: Secondary | ICD-10-CM | POA: Diagnosis not present

## 2022-07-04 DIAGNOSIS — G518 Other disorders of facial nerve: Secondary | ICD-10-CM | POA: Diagnosis not present

## 2022-07-04 DIAGNOSIS — G43719 Chronic migraine without aura, intractable, without status migrainosus: Secondary | ICD-10-CM | POA: Diagnosis not present

## 2022-07-04 DIAGNOSIS — M542 Cervicalgia: Secondary | ICD-10-CM | POA: Diagnosis not present

## 2022-07-04 DIAGNOSIS — F411 Generalized anxiety disorder: Secondary | ICD-10-CM | POA: Diagnosis not present

## 2022-07-04 NOTE — Patient Instructions (Signed)
Patient came in to be swabbed for the [facility] test. I answered any questions she had and proceeded to swab for test. Patient tolerated well and with out compaint

## 2022-07-13 DIAGNOSIS — F332 Major depressive disorder, recurrent severe without psychotic features: Secondary | ICD-10-CM | POA: Diagnosis not present

## 2022-07-16 ENCOUNTER — Other Ambulatory Visit (HOSPITAL_COMMUNITY): Payer: Self-pay | Admitting: Psychiatry

## 2022-07-16 DIAGNOSIS — F330 Major depressive disorder, recurrent, mild: Secondary | ICD-10-CM

## 2022-07-16 DIAGNOSIS — F419 Anxiety disorder, unspecified: Secondary | ICD-10-CM

## 2022-07-20 DIAGNOSIS — F332 Major depressive disorder, recurrent severe without psychotic features: Secondary | ICD-10-CM | POA: Diagnosis not present

## 2022-07-25 ENCOUNTER — Telehealth (HOSPITAL_BASED_OUTPATIENT_CLINIC_OR_DEPARTMENT_OTHER): Payer: BC Managed Care – PPO | Admitting: Psychiatry

## 2022-07-25 ENCOUNTER — Telehealth (HOSPITAL_COMMUNITY): Payer: Self-pay | Admitting: *Deleted

## 2022-07-25 ENCOUNTER — Encounter (HOSPITAL_COMMUNITY): Payer: Self-pay | Admitting: Psychiatry

## 2022-07-25 VITALS — Wt 215.0 lb

## 2022-07-25 DIAGNOSIS — F330 Major depressive disorder, recurrent, mild: Secondary | ICD-10-CM

## 2022-07-25 DIAGNOSIS — F419 Anxiety disorder, unspecified: Secondary | ICD-10-CM

## 2022-07-25 MED ORDER — ESCITALOPRAM OXALATE 20 MG PO TABS: 20.0000 mg | ORAL_TABLET | Freq: Every day | ORAL | 0 refills | Status: DC

## 2022-07-25 MED ORDER — LAMOTRIGINE 100 MG PO TABS: 100.0000 mg | ORAL_TABLET | Freq: Two times a day (BID) | ORAL | 0 refills | Status: DC

## 2022-07-25 MED ORDER — TRAZODONE HCL 50 MG PO TABS: 50.0000 mg | ORAL_TABLET | Freq: Every day | ORAL | 0 refills | Status: DC

## 2022-07-25 NOTE — Telephone Encounter (Signed)
Writer spoke with pt to advise that [facility] results available for her to pick up. Pt will be by office later this afternoon.

## 2022-07-25 NOTE — Progress Notes (Signed)
[facility] MD Virtual Progress Note   Patient Location: Home Provider Location: Home Office  I connect with patient by video and verified that I am speaking with correct person by using two identifiers. I discussed the limitations of evaluation and management by telemedicine and the availability of in person appointments. I also discussed with the patient that there may be a patient responsible charge related to this service. The patient expressed understanding and agreed to proceed.  [name] [id number] 47 y.o.  07/25/2022 11:06 AM  History of Present Illness:  Patient is evaluated by video session.  The last visit we optimized the Lamictal as patient struggled with depression, fatigue, hopelessness and lack of motivation to do things.  She started EMDR and she noticed it causes more stress.  She is doing better since the Lamictal dose increase.  She has no more hopelessness and she is going out more frequently.  She has no headaches, rash, tremors.  She is in a relationship for the past 6 months that has been helpful.  Patient decided with her therapist to focus on counseling and talk therapy and not to do EMDR at this time.  Patient told that has been helpful.  We also recommend genetic testing and I discussed the results with the patient.  The other choices are Pristiq, Viibryd.  Paxil may not be a better choice because it falls into significant drug interaction.  Her current medicine Lexapro is in moderate drug interaction.  Patient does not want to change the medication at this time since she noticed improvement with optimization of Lamictal.  We have discussed TMS but she is hoping the current medicine will continue to work.  She sleeps fair.  She denies any suicidal thoughts.  She denies any anger, anxiety or panic attacks.  Her appetite is okay and weight is unchanged from the past.  She was prescribed Lamictal from her headache doctor but now she like to continue  from our office.  Past Psychiatric History: H/O depression since teens. Tried Paxil, Lunesta and briefly Wellbutrin.  Good response with Cymbalta until stopped working.  No h/o suicidal attempt, psychiatric inpatient treatment, psychosis or self abusive behavior.     Outpatient Encounter Medications as of 07/25/2022  Medication Sig   AIMOVIG 140 MG/ML SOAJ Inject 140 mg into the skin every 30 (thirty) days.   desonide (DESOWEN) 0.05 % cream 1 application Externally Twice a day to eyelids and corners of mouth   escitalopram (LEXAPRO) 20 MG tablet Take 1 tablet (20 mg total) by mouth daily.   fexofenadine (ALLEGRA) 180 MG tablet Take 180 mg by mouth daily.   lamoTRIgine (LAMICTAL) 100 MG tablet Take 1 tablet (100 mg total) by mouth 2 (two) times daily.   Magnesium 250 MG TABS Take 250 mg by mouth at bedtime.   rosuvastatin (CRESTOR) 10 MG tablet Take 10 mg by mouth at bedtime.   SUMAtriptan (IMITREX) 100 MG tablet Take 100 mg by mouth every 2 (two) hours as needed for migraine or headache.   traZODone (DESYREL) 50 MG tablet Take 1 tablet (50 mg total) by mouth at bedtime.   valACYclovir (VALTREX) 500 MG tablet TAKE 1 TABLET (500 MG TOTAL) BY MOUTH DAILY.   ZOMIG 5 MG nasal solution Place 1 spray into the nose daily as needed for migraine.   zonisamide (ZONEGRAN) 100 MG capsule Take 300 mg by mouth daily.   No facility-administered encounter medications on file as of 07/25/2022.    No results  found for this or any previous visit (from the past 2160 hour(s)).   Psychiatric Specialty Exam: Physical Exam  Review of Systems  Weight 215 lb (97.5 kg).There is no height or weight on file to calculate BMI.  General Appearance: Casual  Eye Contact:  Good  Speech:  Clear and Coherent and Slow  Volume:  Normal  Mood:  Euthymic  Affect:  Appropriate  Thought Process:  Goal Directed  Orientation:  Full (Time, Place, and Person)  Thought Content:  Logical  Suicidal Thoughts:  No  Homicidal  Thoughts:  No  Memory:  Immediate;   Good Recent;   Good Remote;   Good  Judgement:  Good  Insight:  Good  Psychomotor Activity:  Normal  Concentration:  Concentration: Good and Attention Span: Good  Recall:  Good  Fund of Knowledge:  Good  Language:  Good  Akathisia:  No  Handed:  Right  AIMS (if indicated):     Assets:  Communication Skills Desire for Improvement Housing Social Support Talents/Skills Transportation  ADL's:  Intact  Cognition:  WNL  Sleep:  ok     Assessment/Plan: Major depressive disorder, recurrent episode, mild (HCC) - Plan: traZODone (DESYREL) 50 MG tablet, escitalopram (LEXAPRO) 20 MG tablet, lamoTRIgine (LAMICTAL) 100 MG tablet  Anxiety - Plan: escitalopram (LEXAPRO) 20 MG tablet, lamoTRIgine (LAMICTAL) 100 MG tablet  Patient doing better with current medication regime.  I reviewed genesight results with the patient.  Recommend to come to the office to pick up a copy of the results.  Continue Lamictal 100 mg twice a day and Lexapro 20 mg daily and trazodone 50 mg at bedtime.  She is in therapy not doing EMDR for now and just like to focus on talk therapy.  Recommended to call us back if she has any question or any concern.  She is also taking a week off from work and that has been helpful.  Follow-up in 3 months.   Follow Up Instructions:     I discussed the assessment and treatment plan with the patient. The patient was provided an opportunity to ask questions and all were answered. The patient agreed with the plan and demonstrated an understanding of the instructions.   The patient was advised to call back or seek an in-person evaluation if the symptoms worsen or if the condition fails to improve as anticipated.    Collaboration of Care: Other provider involved in patient's care AEB notes are available in epic to review.  Patient/Guardian was advised Release of Information must be obtained prior to any record release in order to collaborate their  care with an outside provider. Patient/Guardian was advised if they have not already done so to contact the registration department to sign all necessary forms in order for [place] to release information regarding their care.   Consent: Patient/Guardian gives verbal consent for treatment and assignment of benefits for services provided during this visit. Patient/Guardian expressed understanding and agreed to proceed.     I provided 19 minutes of non face to face time during this encounter.  Note: This document was prepared by [employer] voice dictation technology and any errors that results from this process are unintentional.    Cleotis Nipper, MD 07/25/2022

## 2022-08-03 DIAGNOSIS — F332 Major depressive disorder, recurrent severe without psychotic features: Secondary | ICD-10-CM | POA: Diagnosis not present

## 2022-08-09 DIAGNOSIS — F332 Major depressive disorder, recurrent severe without psychotic features: Secondary | ICD-10-CM | POA: Diagnosis not present

## 2022-08-16 DIAGNOSIS — M542 Cervicalgia: Secondary | ICD-10-CM | POA: Diagnosis not present

## 2022-08-16 DIAGNOSIS — F332 Major depressive disorder, recurrent severe without psychotic features: Secondary | ICD-10-CM | POA: Diagnosis not present

## 2022-08-16 DIAGNOSIS — G518 Other disorders of facial nerve: Secondary | ICD-10-CM | POA: Diagnosis not present

## 2022-08-16 DIAGNOSIS — G43719 Chronic migraine without aura, intractable, without status migrainosus: Secondary | ICD-10-CM | POA: Diagnosis not present

## 2022-08-16 DIAGNOSIS — M791 Myalgia, unspecified site: Secondary | ICD-10-CM | POA: Diagnosis not present

## 2022-08-20 ENCOUNTER — Other Ambulatory Visit: Payer: Self-pay | Admitting: Nurse Practitioner

## 2022-08-22 NOTE — Telephone Encounter (Signed)
Med refill request: Valtrex Last AEX: 11/24/21 Next AEX: 11/29/22 Last MMG (if hormonal med) 04/22/21 Refill authorized: Please Advise, #[id number]

## 2022-08-23 DIAGNOSIS — F332 Major depressive disorder, recurrent severe without psychotic features: Secondary | ICD-10-CM | POA: Diagnosis not present

## 2022-08-29 ENCOUNTER — Telehealth (HOSPITAL_COMMUNITY): Payer: BC Managed Care – PPO | Admitting: Psychiatry

## 2022-09-06 DIAGNOSIS — F332 Major depressive disorder, recurrent severe without psychotic features: Secondary | ICD-10-CM | POA: Diagnosis not present

## 2022-09-13 DIAGNOSIS — F332 Major depressive disorder, recurrent severe without psychotic features: Secondary | ICD-10-CM | POA: Diagnosis not present

## 2022-09-14 DIAGNOSIS — E785 Hyperlipidemia, unspecified: Secondary | ICD-10-CM | POA: Diagnosis not present

## 2022-09-17 ENCOUNTER — Other Ambulatory Visit (HOSPITAL_BASED_OUTPATIENT_CLINIC_OR_DEPARTMENT_OTHER): Payer: Self-pay

## 2022-09-17 ENCOUNTER — Encounter (HOSPITAL_BASED_OUTPATIENT_CLINIC_OR_DEPARTMENT_OTHER): Payer: Self-pay

## 2022-09-17 MED ORDER — ZEPBOUND 2.5 MG/0.5ML ~~LOC~~ SOAJ
2.5000 mg | SUBCUTANEOUS | 0 refills | Status: DC
  Filled 2022-09-17: qty 2, 28d supply, fill #0

## 2022-09-19 ENCOUNTER — Other Ambulatory Visit (HOSPITAL_BASED_OUTPATIENT_CLINIC_OR_DEPARTMENT_OTHER): Payer: Self-pay

## 2022-09-20 ENCOUNTER — Other Ambulatory Visit (HOSPITAL_BASED_OUTPATIENT_CLINIC_OR_DEPARTMENT_OTHER): Payer: Self-pay

## 2022-09-20 DIAGNOSIS — F332 Major depressive disorder, recurrent severe without psychotic features: Secondary | ICD-10-CM | POA: Diagnosis not present

## 2022-09-27 DIAGNOSIS — F332 Major depressive disorder, recurrent severe without psychotic features: Secondary | ICD-10-CM | POA: Diagnosis not present

## 2022-09-29 DIAGNOSIS — G43719 Chronic migraine without aura, intractable, without status migrainosus: Secondary | ICD-10-CM | POA: Diagnosis not present

## 2022-09-29 DIAGNOSIS — M542 Cervicalgia: Secondary | ICD-10-CM | POA: Diagnosis not present

## 2022-09-29 DIAGNOSIS — M791 Myalgia, unspecified site: Secondary | ICD-10-CM | POA: Diagnosis not present

## 2022-09-29 DIAGNOSIS — G518 Other disorders of facial nerve: Secondary | ICD-10-CM | POA: Diagnosis not present

## 2022-10-04 DIAGNOSIS — F332 Major depressive disorder, recurrent severe without psychotic features: Secondary | ICD-10-CM | POA: Diagnosis not present

## 2022-10-05 ENCOUNTER — Other Ambulatory Visit (HOSPITAL_BASED_OUTPATIENT_CLINIC_OR_DEPARTMENT_OTHER): Payer: Self-pay

## 2022-10-05 MED ORDER — TIRZEPATIDE-WEIGHT MANAGEMENT 5 MG/0.5ML ~~LOC~~ SOAJ
5.0000 mg | SUBCUTANEOUS | 5 refills | Status: AC
  Filled 2022-10-05 – 2022-10-14 (×2): qty 2, 28d supply, fill #0
  Filled 2022-11-19: qty 2, 28d supply, fill #1
  Filled 2023-01-15 – 2023-01-17 (×2): qty 2, 28d supply, fill #2
  Filled 2023-02-11: qty 2, 28d supply, fill #3
  Filled 2023-05-31: qty 2, 28d supply, fill #4
  Filled 2023-08-14: qty 2, 28d supply, fill #5

## 2022-10-07 ENCOUNTER — Other Ambulatory Visit (HOSPITAL_BASED_OUTPATIENT_CLINIC_OR_DEPARTMENT_OTHER): Payer: Self-pay

## 2022-10-11 DIAGNOSIS — F332 Major depressive disorder, recurrent severe without psychotic features: Secondary | ICD-10-CM | POA: Diagnosis not present

## 2022-10-14 ENCOUNTER — Other Ambulatory Visit (HOSPITAL_BASED_OUTPATIENT_CLINIC_OR_DEPARTMENT_OTHER): Payer: Self-pay

## 2022-10-18 ENCOUNTER — Encounter (HOSPITAL_COMMUNITY): Payer: Self-pay | Admitting: Psychiatry

## 2022-10-18 ENCOUNTER — Telehealth (HOSPITAL_BASED_OUTPATIENT_CLINIC_OR_DEPARTMENT_OTHER): Payer: BC Managed Care – PPO | Admitting: Psychiatry

## 2022-10-18 VITALS — Wt 220.0 lb

## 2022-10-18 DIAGNOSIS — F419 Anxiety disorder, unspecified: Secondary | ICD-10-CM | POA: Diagnosis not present

## 2022-10-18 DIAGNOSIS — F330 Major depressive disorder, recurrent, mild: Secondary | ICD-10-CM | POA: Diagnosis not present

## 2022-10-18 DIAGNOSIS — F332 Major depressive disorder, recurrent severe without psychotic features: Secondary | ICD-10-CM | POA: Diagnosis not present

## 2022-10-18 MED ORDER — TRAZODONE HCL 50 MG PO TABS
50.0000 mg | ORAL_TABLET | Freq: Every day | ORAL | 1 refills | Status: DC
Start: 2022-10-18 — End: 2023-04-17

## 2022-10-18 MED ORDER — LAMOTRIGINE 100 MG PO TABS: 100.0000 mg | ORAL_TABLET | Freq: Two times a day (BID) | ORAL | 1 refills | Status: DC

## 2022-10-18 MED ORDER — ESCITALOPRAM OXALATE 20 MG PO TABS: 20.0000 mg | ORAL_TABLET | Freq: Every day | ORAL | 1 refills | Status: DC

## 2022-10-18 NOTE — Progress Notes (Signed)
[facility] MD Virtual Progress Note   Patient Location: Work Provider Location: Home Office  I connect with patient by video and verified that I am speaking with correct person by using two identifiers. I discussed the limitations of evaluation and management by telemedicine and the availability of in person appointments. I also discussed with the patient that there may be a patient responsible charge related to this service. The patient expressed understanding and agreed to proceed.  [name] [id number] 47 y.o.  10/18/2022 10:10 AM  History of Present Illness:  Patient is evaluated by video session.  She is at work.  She is actually doing very well since started focusing on her general health and going to gym.  She is in therapy with Joni Reining but has not doing EMDR because it triggers the anxiety.  She also started relationship which is now 78-year-old and so far she is happy in the relationship.  She recently went to a concert and had a good time.  She is going to gym regularly and she feels it is helping.  She is sleeping good.  She also started weight loss program and taking Zepbound because she was feeling very sad and depressed about weight gain.  She went up to 240 but now she had lost 20 pounds.  She denies any crying spells or any feeling of hopelessness or worthlessness.  She has no tremor or shakes or any EPS.  She had picked up the genetic testing results from our office.  Her headaches are not as bad or intense.  She is compliant with Lamictal and reported no rash.  Provigil level is okay.  She denies any major panic attack.  Past Psychiatric History: H/O depression since teens. Tried Paxil, Lunesta and briefly Wellbutrin.  Good response with Cymbalta until stopped working.  No h/o suicidal attempt, psychiatric inpatient treatment, psychosis or self abusive behavior.       Outpatient Encounter Medications as of 10/18/2022  Medication Sig   AIMOVIG 140  MG/ML SOAJ Inject 140 mg into the skin every 30 (thirty) days.   desonide (DESOWEN) 0.05 % cream 1 application Externally Twice a day to eyelids and corners of mouth   escitalopram (LEXAPRO) 20 MG tablet Take 1 tablet (20 mg total) by mouth daily.   fexofenadine (ALLEGRA) 180 MG tablet Take 180 mg by mouth daily.   lamoTRIgine (LAMICTAL) 100 MG tablet Take 1 tablet (100 mg total) by mouth 2 (two) times daily.   Magnesium 250 MG TABS Take 250 mg by mouth at bedtime.   rosuvastatin (CRESTOR) 10 MG tablet Take 10 mg by mouth at bedtime.   SUMAtriptan (IMITREX) 100 MG tablet Take 100 mg by mouth every 2 (two) hours as needed for migraine or headache.   tirzepatide (ZEPBOUND) 2.5 MG/0.5ML Pen Inject 2.5 mg into the skin once a week for 4 weeks then increase to 5 mg dose   tirzepatide (ZEPBOUND) 5 MG/0.5ML Pen Inject 5 mg into the skin once a week.   traZODone (DESYREL) 50 MG tablet Take 1 tablet (50 mg total) by mouth at bedtime.   valACYclovir (VALTREX) 500 MG tablet TAKE 1 TABLET (500 MG TOTAL) BY MOUTH DAILY.   ZOMIG 5 MG nasal solution Place 1 spray into the nose daily as needed for migraine.   zonisamide (ZONEGRAN) 100 MG capsule Take 300 mg by mouth daily.   No facility-administered encounter medications on file as of 10/18/2022.    No results found for this or any previous  visit (from the past 2160 hour(s)).   Psychiatric Specialty Exam: Physical Exam  Review of Systems  Weight 220 lb (99.8 kg).There is no height or weight on file to calculate BMI.  General Appearance:  wearing scrubs  Eye Contact:  Good  Speech:  Clear and Coherent  Volume:  Normal  Mood:  Euthymic  Affect:   bright  Thought Process:  Goal Directed  Orientation:  Full (Time, Place, and Person)  Thought Content:  WDL  Suicidal Thoughts:  No  Homicidal Thoughts:  No  Memory:  Immediate;   Good Recent;   Good Remote;   Good  Judgement:  Good  Insight:  Good  Psychomotor Activity:  Normal  Concentration:   Concentration: Good and Attention Span: Good  Recall:  Good  Fund of Knowledge:  Good  Language:  Good  Akathisia:  No  Handed:  Right  AIMS (if indicated):     Assets:  Communication Skills Desire for Improvement Housing Physical Health Resilience Social Support Talents/Skills Transportation  ADL's:  Intact  Cognition:  WNL  Sleep:  good     Assessment/Plan: Major depressive disorder, recurrent episode, mild (HCC) - Plan: escitalopram (LEXAPRO) 20 MG tablet, lamoTRIgine (LAMICTAL) 100 MG tablet, traZODone (DESYREL) 50 MG tablet  Anxiety - Plan: escitalopram (LEXAPRO) 20 MG tablet, lamoTRIgine (LAMICTAL) 100 MG tablet  Patient doing better on current regime.  She started going to gym and seeing a therapist for coping skills.  She reported her relationship is also going well.  We will continue Lamictal 100 mg twice a day, Lexapro 20 mg daily and trazodone 50 mg at bedtime.  Discussed medication side effects and benefits.  Recommended to call us back if she has any question or any concern.  Follow-up in 6 months   Follow Up Instructions:     I discussed the assessment and treatment plan with the patient. The patient was provided an opportunity to ask questions and all were answered. The patient agreed with the plan and demonstrated an understanding of the instructions.   The patient was advised to call back or seek an in-person evaluation if the symptoms worsen or if the condition fails to improve as anticipated.    Collaboration of Care: Other provider involved in patient's care AEB notes are available in epic to review  Patient/Guardian was advised Release of Information must be obtained prior to any record release in order to collaborate their care with an outside provider. Patient/Guardian was advised if they have not already done so to contact the registration department to sign all necessary forms in order for [place] to release information regarding their care.   Consent:  Patient/Guardian gives verbal consent for treatment and assignment of benefits for services provided during this visit. Patient/Guardian expressed understanding and agreed to proceed.     I provided 18 minutes of non face to face time during this encounter.  Note: This document was prepared by [employer] voice dictation technology and any errors that results from this process are unintentional.    Cleotis Nipper, MD 10/18/2022

## 2022-11-01 DIAGNOSIS — F332 Major depressive disorder, recurrent severe without psychotic features: Secondary | ICD-10-CM | POA: Diagnosis not present

## 2022-11-10 DIAGNOSIS — M791 Myalgia, unspecified site: Secondary | ICD-10-CM | POA: Diagnosis not present

## 2022-11-10 DIAGNOSIS — G518 Other disorders of facial nerve: Secondary | ICD-10-CM | POA: Diagnosis not present

## 2022-11-10 DIAGNOSIS — G43719 Chronic migraine without aura, intractable, without status migrainosus: Secondary | ICD-10-CM | POA: Diagnosis not present

## 2022-11-10 DIAGNOSIS — M542 Cervicalgia: Secondary | ICD-10-CM | POA: Diagnosis not present

## 2022-11-15 DIAGNOSIS — F332 Major depressive disorder, recurrent severe without psychotic features: Secondary | ICD-10-CM | POA: Diagnosis not present

## 2022-11-29 ENCOUNTER — Ambulatory Visit: Payer: BC Managed Care – PPO | Admitting: Nurse Practitioner

## 2022-11-29 DIAGNOSIS — F332 Major depressive disorder, recurrent severe without psychotic features: Secondary | ICD-10-CM | POA: Diagnosis not present

## 2022-12-15 ENCOUNTER — Other Ambulatory Visit (HOSPITAL_BASED_OUTPATIENT_CLINIC_OR_DEPARTMENT_OTHER): Payer: Self-pay

## 2022-12-15 DIAGNOSIS — N1831 Chronic kidney disease, stage 3a: Secondary | ICD-10-CM | POA: Diagnosis not present

## 2022-12-15 MED ORDER — ZEPBOUND 5 MG/0.5ML ~~LOC~~ SOAJ
5.0000 mg | SUBCUTANEOUS | 2 refills | Status: AC
Start: 2022-12-15 — End: ?
  Filled 2022-12-15: qty 2, 28d supply, fill #0
  Filled 2023-03-12: qty 2, 28d supply, fill #1
  Filled 2023-05-04: qty 2, 28d supply, fill #2
  Filled 2023-11-28: qty 2, 28d supply, fill #3

## 2022-12-19 ENCOUNTER — Other Ambulatory Visit (HOSPITAL_BASED_OUTPATIENT_CLINIC_OR_DEPARTMENT_OTHER): Payer: Self-pay

## 2022-12-20 DIAGNOSIS — M542 Cervicalgia: Secondary | ICD-10-CM | POA: Diagnosis not present

## 2022-12-20 DIAGNOSIS — G43719 Chronic migraine without aura, intractable, without status migrainosus: Secondary | ICD-10-CM | POA: Diagnosis not present

## 2022-12-20 DIAGNOSIS — F332 Major depressive disorder, recurrent severe without psychotic features: Secondary | ICD-10-CM | POA: Diagnosis not present

## 2022-12-20 DIAGNOSIS — M791 Myalgia, unspecified site: Secondary | ICD-10-CM | POA: Diagnosis not present

## 2022-12-20 DIAGNOSIS — G518 Other disorders of facial nerve: Secondary | ICD-10-CM | POA: Diagnosis not present

## 2023-01-10 DIAGNOSIS — F332 Major depressive disorder, recurrent severe without psychotic features: Secondary | ICD-10-CM | POA: Diagnosis not present

## 2023-01-17 ENCOUNTER — Other Ambulatory Visit (HOSPITAL_BASED_OUTPATIENT_CLINIC_OR_DEPARTMENT_OTHER): Payer: Self-pay

## 2023-01-24 DIAGNOSIS — M791 Myalgia, unspecified site: Secondary | ICD-10-CM | POA: Diagnosis not present

## 2023-01-24 DIAGNOSIS — M542 Cervicalgia: Secondary | ICD-10-CM | POA: Diagnosis not present

## 2023-01-24 DIAGNOSIS — G43719 Chronic migraine without aura, intractable, without status migrainosus: Secondary | ICD-10-CM | POA: Diagnosis not present

## 2023-01-24 DIAGNOSIS — G518 Other disorders of facial nerve: Secondary | ICD-10-CM | POA: Diagnosis not present

## 2023-02-07 DIAGNOSIS — F332 Major depressive disorder, recurrent severe without psychotic features: Secondary | ICD-10-CM | POA: Diagnosis not present

## 2023-02-16 ENCOUNTER — Other Ambulatory Visit (HOSPITAL_BASED_OUTPATIENT_CLINIC_OR_DEPARTMENT_OTHER): Payer: Self-pay

## 2023-02-22 ENCOUNTER — Ambulatory Visit: Payer: BC Managed Care – PPO | Admitting: Nurse Practitioner

## 2023-02-22 NOTE — Progress Notes (Deleted)
[name] [facility] 1975/04/18 [id number]   History:  48 y.o. G0 presents for annual exam. Cycles are starting to spread out ranging from 28-45 days and have become heavier. Bleeding is heavy first 3 days, sometimes requiring changing of super plus tampons every 3 hours. Mild menopausal symptoms. On Lamictal. Normal pap history. History of chronic vulvitis -  has not needed clobetasol for a while. Migraines managed by neurology, depression/anxiety managed by psychiatry.   Gynecologic History No LMP recorded.   Contraception/Family planning: none Sexually active: Yes  Health Maintenance Last Pap: 11/13/2018. Results were: Normal neg HPV Last mammogram: 04/22/2021. Results were: Normal Last colonoscopy: 07/2021. Results were: 5-year recall d/t family history Last Dexa: Not indicated  Past medical history, past surgical history, family history and social history were all reviewed and documented in the EPIC chart. 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   ROS:  A ROS was performed and pertinent positives and negatives are included.  Exam:  There were no vitals filed for this visit.   There is no height or weight on file to calculate BMI.  General appearance:  Normal Thyroid:  Symmetrical, normal in size, without palpable masses or nodularity. Respiratory  Auscultation:  Clear without wheezing or rhonchi Cardiovascular  Auscultation:  Regular rate, without rubs, murmurs or gallops  Edema/varicosities:  Not grossly evident Abdominal  Soft,nontender, without masses, guarding or rebound.  Liver/spleen:  No organomegaly noted  Hernia:  None appreciated  Skin  Inspection:  Grossly normal Breasts: Examined lying and sitting.   Right: Without masses, retractions, nipple discharge or axillary adenopathy.   Left: Without masses, retractions, nipple discharge or axillary adenopathy. Pelvic: External genitalia:  no lesions              Urethra:   normal appearing urethra with no masses, tenderness or lesions              Bartholins and Skenes: normal                 Vagina: normal appearing vagina with normal color and discharge, no lesions              Cervix: no lesions Bimanual Exam:  Uterus:  no masses or tenderness              Adnexa: no mass, fullness, tenderness              Rectovaginal: Deferred              Anus:  normal, no lesions  Patient informed chaperone available to be present for breast and pelvic exam. Patient has requested no chaperone to be present. Patient has been advised what will be completed during breast and pelvic exam.   Assessment/Plan:  48 y.o. G0 for annual exam.   Well female exam with routine gynecological exam - Education provided on SBEs, importance of preventative screenings, current guidelines, high calcium diet, regular exercise, and multivitamin daily. Labs with PCP.   Perimenopause - cycles are starting to become slightly irregular ranging from 28-45 days. Mild menopausal symptoms. Discussed what to expect during perimenopause.   Menorrhagia with irregular cycle - POPs versus Lysteda. Ibuprofen 600-800 mg every 8 hours during heaviest days may decrease bleeding by up to 40%. She expressed concern with using hormonal contraception with Lamictal. Reassured that progestin-only contraception has not been found to alter Lamictal levels but she may also discuss with psychiatry.   Screening for cervical cancer -  Normal Pap history.  Will repeat at 5-year interval per guidelines.  Screening for breast cancer - Normal mammogram history.  Continue annual screenings.  Normal breast exam today.  Screening for colon cancer - July 2023 colonoscopy. Will repeat in 5-year per GI recommendation.  No follow-ups on file.    Olivia Mackie DNP, 1:03 PM 02/22/2023

## 2023-03-01 ENCOUNTER — Other Ambulatory Visit: Payer: Self-pay | Admitting: Nurse Practitioner

## 2023-03-01 NOTE — Telephone Encounter (Signed)
 Med refill request: valtrex  500 mg  Last AEX: 11/24/21 Next AEX: 03/16/23 Last MMG (if hormonal med) 04/22/21 BI-RADS cat 2: Benign Refill authorized: Last rx sent 08/22/22 #90 1 refill. Please approve or deny as appropriate.

## 2023-03-13 ENCOUNTER — Other Ambulatory Visit: Payer: Self-pay

## 2023-03-13 DIAGNOSIS — D225 Melanocytic nevi of trunk: Secondary | ICD-10-CM | POA: Diagnosis not present

## 2023-03-13 DIAGNOSIS — L821 Other seborrheic keratosis: Secondary | ICD-10-CM | POA: Diagnosis not present

## 2023-03-13 DIAGNOSIS — L570 Actinic keratosis: Secondary | ICD-10-CM | POA: Diagnosis not present

## 2023-03-13 DIAGNOSIS — L814 Other melanin hyperpigmentation: Secondary | ICD-10-CM | POA: Diagnosis not present

## 2023-03-13 DIAGNOSIS — D2262 Melanocytic nevi of left upper limb, including shoulder: Secondary | ICD-10-CM | POA: Diagnosis not present

## 2023-03-16 ENCOUNTER — Ambulatory Visit: Payer: BC Managed Care – PPO | Admitting: Nurse Practitioner

## 2023-03-17 ENCOUNTER — Other Ambulatory Visit (HOSPITAL_BASED_OUTPATIENT_CLINIC_OR_DEPARTMENT_OTHER): Payer: Self-pay

## 2023-03-28 ENCOUNTER — Ambulatory Visit (INDEPENDENT_AMBULATORY_CARE_PROVIDER_SITE_OTHER): Payer: BC Managed Care – PPO | Admitting: Nurse Practitioner

## 2023-03-28 ENCOUNTER — Encounter: Payer: Self-pay | Admitting: Nurse Practitioner

## 2023-03-28 ENCOUNTER — Other Ambulatory Visit (HOSPITAL_COMMUNITY)
Admission: RE | Admit: 2023-03-28 | Discharge: 2023-03-28 | Disposition: A | Discharge status: Home / Self Care | Source: Ambulatory Visit | Attending: Nurse Practitioner | Admitting: Nurse Practitioner

## 2023-03-28 VITALS — BP 112/70 | HR 79 | Ht 68.25 in | Wt 188.0 lb

## 2023-03-28 DIAGNOSIS — Z01419 Encounter for gynecological examination (general) (routine) without abnormal findings: Secondary | ICD-10-CM | POA: Diagnosis not present

## 2023-03-28 DIAGNOSIS — N951 Menopausal and female climacteric states: Secondary | ICD-10-CM

## 2023-03-28 DIAGNOSIS — G43719 Chronic migraine without aura, intractable, without status migrainosus: Secondary | ICD-10-CM | POA: Diagnosis not present

## 2023-03-28 DIAGNOSIS — Z1331 Encounter for screening for depression: Secondary | ICD-10-CM

## 2023-03-28 DIAGNOSIS — Z124 Encounter for screening for malignant neoplasm of cervix: Secondary | ICD-10-CM | POA: Insufficient documentation

## 2023-03-28 DIAGNOSIS — M542 Cervicalgia: Secondary | ICD-10-CM | POA: Diagnosis not present

## 2023-03-28 DIAGNOSIS — M791 Myalgia, unspecified site: Secondary | ICD-10-CM | POA: Diagnosis not present

## 2023-03-28 DIAGNOSIS — F332 Major depressive disorder, recurrent severe without psychotic features: Secondary | ICD-10-CM | POA: Diagnosis not present

## 2023-03-28 DIAGNOSIS — G518 Other disorders of facial nerve: Secondary | ICD-10-CM | POA: Diagnosis not present

## 2023-03-28 NOTE — Progress Notes (Signed)
 [name] [facility] [birth year] [id number]   History:  48 y.o. G0 presents for annual exam. Cycles ranging from 20-50 days, 1 day of heavy bleeding then tapers off. Had some mild hot flashes for a few months but these are better. Notices some brain fog. Normal pap history. History of chronic vulvitis, has not used Clobetasol for a while. Migraines managed by neurology, depression/anxiety managed by psychiatry.   Gynecologic History Patient's last menstrual period was 03/17/2023 (approximate).   Contraception/Family planning: none Sexually active: Yes  Health Maintenance Last Pap: 11/13/2018. Results were: Normal neg HPV Last mammogram: 09/2022. Results were: Normal per patient Last colonoscopy: 07/2021. Results were: 5-year recall d/t family history Last Dexa: Not indicated  Flowsheet Row Office Visit from 03/28/2023 in [facility]  PHQ-9 Total Score 5        Past medical history, past surgical history, family history and social history were all reviewed and documented in the EPIC chart. 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   ROS:  A ROS was performed and pertinent positives and negatives are included.  Exam:  Vitals:   03/28/23 0934  BP: 112/70  Pulse: 79  SpO2: 98%  Weight: 188 lb (85.3 kg)  Height: 5' 8.25" (1.734 m)     Body mass index is 28.38 kg/m.  General appearance:  Normal Thyroid:  Symmetrical, normal in size, without palpable masses or nodularity. Respiratory  Auscultation:  Clear without wheezing or rhonchi Cardiovascular  Auscultation:  Regular rate, without rubs, murmurs or gallops  Edema/varicosities:  Not grossly evident Abdominal  Soft,nontender, without masses, guarding or rebound.  Liver/spleen:  No organomegaly noted  Hernia:  None appreciated  Skin  Inspection:  Grossly normal Breasts: Examined lying and sitting.   Right: Without masses,  retractions, nipple discharge or axillary adenopathy.   Left: Without masses, retractions, nipple discharge or axillary adenopathy. Pelvic: External genitalia:  no lesions              Urethra:  normal appearing urethra with no masses, tenderness or lesions              Bartholins and Skenes: normal                 Vagina: normal appearing vagina with normal color and discharge, no lesions              Cervix: no lesions Bimanual Exam:  Uterus:  no masses or tenderness              Adnexa: no mass, fullness, tenderness              Rectovaginal: Deferred              Anus:  normal, no lesions  Patient informed chaperone available to be present for breast and pelvic exam. Patient has requested no chaperone to be present. Patient has been advised what will be completed during breast and pelvic exam.   Assessment/Plan:  48 y.o. G0 for annual exam.   Well female exam with routine gynecological exam - Education provided on SBEs, importance of preventative screenings, current guidelines, high calcium diet, regular exercise, and multivitamin daily. Labs with PCP.   Perimenopause - cycles range from 20-50 days. Declines med management. Mild menopausal symptoms. Discussed what to expect during perimenopause. Recommend Mag L-threonate for brain fog.   Cervical cancer screening - Plan: Cytology - PAP( [facility]). Normal pap  history.   Screening for breast cancer - Normal mammogram history. UTD. Normal breast exam today.  Screening for colon cancer - July 2023 colonoscopy. Will repeat in 5-year per GI recommendation.  Return in about 1 year (around 03/27/2024) for Annual.     Olivia Mackie DNP, 10:14 AM 03/28/2023

## 2023-03-30 LAB — CYTOLOGY - PAP
Adequacy: ABSENT
Comment: NEGATIVE
Diagnosis: NEGATIVE
High risk HPV: NEGATIVE

## 2023-04-03 ENCOUNTER — Encounter: Payer: Self-pay | Admitting: Nurse Practitioner

## 2023-04-10 ENCOUNTER — Other Ambulatory Visit (HOSPITAL_COMMUNITY): Payer: Self-pay | Admitting: Psychiatry

## 2023-04-10 ENCOUNTER — Other Ambulatory Visit (HOSPITAL_COMMUNITY): Payer: Self-pay

## 2023-04-10 DIAGNOSIS — F419 Anxiety disorder, unspecified: Secondary | ICD-10-CM

## 2023-04-10 DIAGNOSIS — F330 Major depressive disorder, recurrent, mild: Secondary | ICD-10-CM

## 2023-04-10 MED ORDER — ESCITALOPRAM OXALATE 20 MG PO TABS: 20.0000 mg | ORAL_TABLET | Freq: Every day | ORAL | 0 refills | Status: DC

## 2023-04-17 ENCOUNTER — Telehealth (HOSPITAL_BASED_OUTPATIENT_CLINIC_OR_DEPARTMENT_OTHER): Payer: BC Managed Care – PPO | Admitting: Psychiatry

## 2023-04-17 ENCOUNTER — Encounter (HOSPITAL_COMMUNITY): Payer: Self-pay | Admitting: Psychiatry

## 2023-04-17 DIAGNOSIS — F330 Major depressive disorder, recurrent, mild: Secondary | ICD-10-CM | POA: Diagnosis not present

## 2023-04-17 DIAGNOSIS — F419 Anxiety disorder, unspecified: Secondary | ICD-10-CM

## 2023-04-17 MED ORDER — LAMOTRIGINE 100 MG PO TABS
100.0000 mg | ORAL_TABLET | Freq: Two times a day (BID) | ORAL | 1 refills | Status: DC
  Filled 2023-06-12: qty 180, 90d supply, fill #0

## 2023-04-17 MED ORDER — ESCITALOPRAM OXALATE 20 MG PO TABS
20.0000 mg | ORAL_TABLET | Freq: Every day | ORAL | 1 refills | Status: DC
Start: 2023-04-17 — End: 2023-10-18
  Filled 2023-06-12: qty 90, 90d supply, fill #0
  Filled 2023-09-04: qty 30, 30d supply, fill #1

## 2023-04-17 MED ORDER — TRAZODONE HCL 50 MG PO TABS
50.0000 mg | ORAL_TABLET | Freq: Every day | ORAL | 1 refills | Status: DC
  Filled 2023-06-12: qty 90, 90d supply, fill #0
  Filled 2023-10-04: qty 90, 90d supply, fill #1

## 2023-04-17 NOTE — Progress Notes (Signed)
 [facility] MD Virtual Progress Note   Patient Location: Work Provider Location: Home Office  I connect with patient by video and verified that I am speaking with correct person by using two identifiers. I discussed the limitations of evaluation and management by telemedicine and the availability of in person appointments. I also discussed with the patient that there may be a patient responsible charge related to this service. The patient expressed understanding and agreed to proceed.  [name] [id number] 48 y.o.  04/17/2023 10:07 AM  History of Present Illness:  Patient is evaluated by video session.  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  She started to have increased headaches, anxiety and nervousness.  She is in therapy with Duanne Limerick and that had been helpful.  She also reported relationship with the girlfriend is going well.  She is taking Zepbound which is helping her weight.  She lost another few pounds since the last visit.  She denies any irritability, anger, mania, psychosis or any crying spells.  She denies any suicidal thoughts.  She has no tremor or shakes or any EPS.  She is on Lamictal 100 mg twice a day, Lexapro 20 mg daily and trazodone 50 mg daily at bedtime.  She denies any major panic attack but feel overwhelmed with her current job because of 10-hour shifts.  She is not happy with the upper management and she feel it is toxic.  She is looking forward for her new job.  Past Psychiatric History: H/O depression since teens. Tried Paxil, Lunesta and briefly Wellbutrin.  Good response with Cymbalta until stopped working.  No h/o suicidal attempt, psychiatric inpatient treatment, psychosis or self abusive behavior.       Outpatient Encounter Medications as of 04/17/2023  Medication Sig   AIMOVIG 140 MG/ML SOAJ Inject 140 mg into the skin every 30 (thirty) days.   DULoxetine (CYMBALTA) 30 MG capsule 1 capsule Orally Twice a day   escitalopram (LEXAPRO) 20 MG tablet Take 1 tablet (20 mg total) by mouth daily. Bridge to 3/24 appointment   Ferrous Sulfate (IRON) 325 (65 Fe) MG TABS 1 tablet Orally Once a day   fexofenadine (ALLEGRA) 180 MG tablet Take 180 mg by mouth daily.   lamoTRIgine (LAMICTAL) 100 MG tablet Take 1 tablet (100 mg total) by mouth 2 (two) times daily.   Magnesium 250 MG TABS Take 250 mg by mouth at bedtime.   Norethindrone-Ethinyl Estradiol-Fe Biphas (LO LOESTRIN FE) 1 MG-10 MCG / 10 MCG tablet 1 tablet Orally Once a day   Omega-3 Fatty Acids (FISH OIL) 1200 MG CAPS 1 capsule Orally Once a day   rosuvastatin (CRESTOR) 10 MG tablet Take 10 mg by mouth at bedtime.   SUMAtriptan (IMITREX) 100 MG tablet Take 100 mg by mouth every 2 (two) hours as needed for migraine or headache.   tirzepatide (ZEPBOUND) 5 MG/0.5ML Pen Inject 5 mg into the skin once a week.   tirzepatide (ZEPBOUND) 5 MG/0.5ML Pen Inject 5 mg into the skin once a week.   traZODone (DESYREL) 50 MG tablet Take 1 tablet (50 mg total) by mouth at bedtime.   valACYclovir (VALTREX) 500 MG tablet TAKE 1 TABLET BY MOUTH EVERY DAY   ZOMIG 5 MG nasal  solution Place 1 spray into the nose daily as needed for migraine.   zonisamide (ZONEGRAN) 100 MG capsule Take 300 mg by mouth daily.   No facility-administered encounter medications on file as of 04/17/2023.    Recent Results (from the past 2160 hours)  Cytology - PAP( [facility])     Status: None   Collection Time: 03/28/23  9:57 AM  Result Value Ref Range   High risk HPV Negative    Adequacy      Satisfactory for evaluation; transformation zone component ABSENT.   Diagnosis      - Negative for intraepithelial lesion or malignancy (NILM)   Comment Cellular changes consistent with  hyperkeratosis.    Comment Normal Reference Range HPV - Negative     Psychiatric Specialty Exam: Physical Exam  Review of Systems  Neurological:  Positive for headaches.    Weight 185 lb (83.9 kg), last menstrual period 03/17/2023.There is no height or weight on file to calculate BMI.  General Appearance:  wearing scrubs  Eye Contact:  Good  Speech:  Clear and Coherent and Normal Rate  Volume:  Normal  Mood:  Anxious  Affect:  Appropriate  Thought Process:  Goal Directed  Orientation:  Full (Time, Place, and Person)  Thought Content:  Logical  Suicidal Thoughts:  No  Homicidal Thoughts:  No  Memory:  Immediate;   Good Recent;   Good Remote;   Good  Judgement:  Intact  Insight:  Present  Psychomotor Activity:  Normal  Concentration:  Concentration: Good and Attention Span: Good  Recall:  Good  Fund of Knowledge:  Good  Language:  Good  Akathisia:  No  Handed:  Right  AIMS (if indicated):     Assets:  Communication Skills Desire for Improvement Housing Social Support Talents/Skills Transportation  ADL's:  Intact  Cognition:  WNL  Sleep:   ok       Assessment/Plan: Major depressive disorder, recurrent episode, mild (HCC) - Plan: traZODone (DESYREL) 50 MG tablet, lamoTRIgine (LAMICTAL) 100 MG tablet, escitalopram (LEXAPRO) 20 MG tablet  Anxiety - Plan: lamoTRIgine (LAMICTAL) 100 MG tablet, escitalopram (LEXAPRO) 20 MG tablet  Discussed anxiety and worklife balance.  Patient hoping when she start new job but 8 hours a shift she has more time for herself.  She is happy since she made the decision.  She does not want to change the medication.  She admitted headaches lately but hoping it will also get better with the new job.  She does not want to change the medication.  Continue Lexapro 20 mg daily, trazodone 50 mg at bedtime and Lamictal 100 mg twice a day.  She is on Zepbound and had lost weight.  She has no tremors, shakes, rash or any itching.  Her relationship is  going well with her girlfriend.  Encouraged to call us back if she has any question or any concern.  Follow-up in 6 months.  Follow Up Instructions:     I discussed the assessment and treatment plan with the patient. The patient was provided an opportunity to ask questions and all were answered. The patient agreed with the plan and demonstrated an understanding of the instructions.   The patient was advised to call back or seek an in-person evaluation if the symptoms worsen or if the condition fails to improve as anticipated.    Collaboration of Care: Other provider involved in patient's care AEB notes are available in epic to review  Patient/Guardian was advised Release of Information must  be obtained prior to any record release in order to collaborate their care with an outside provider. Patient/Guardian was advised if they have not already done so to contact the registration department to sign all necessary forms in order for [place] to release information regarding their care.   Consent: Patient/Guardian gives verbal consent for treatment and assignment of benefits for services provided during this visit. Patient/Guardian expressed understanding and agreed to proceed.     I provided 20 minutes of non face to face time during this encounter.  Note: This document was prepared by [employer] voice dictation technology and any errors that results from this process are unintentional.    Cleotis Nipper, MD 04/17/2023

## 2023-04-25 DIAGNOSIS — F332 Major depressive disorder, recurrent severe without psychotic features: Secondary | ICD-10-CM | POA: Diagnosis not present

## 2023-05-04 ENCOUNTER — Other Ambulatory Visit (HOSPITAL_BASED_OUTPATIENT_CLINIC_OR_DEPARTMENT_OTHER): Payer: Self-pay

## 2023-05-05 ENCOUNTER — Other Ambulatory Visit (HOSPITAL_BASED_OUTPATIENT_CLINIC_OR_DEPARTMENT_OTHER): Payer: Self-pay

## 2023-05-08 ENCOUNTER — Other Ambulatory Visit (HOSPITAL_BASED_OUTPATIENT_CLINIC_OR_DEPARTMENT_OTHER): Payer: Self-pay

## 2023-05-29 DIAGNOSIS — D6489 Other specified anemias: Secondary | ICD-10-CM | POA: Diagnosis not present

## 2023-05-29 DIAGNOSIS — Z0000 Encounter for general adult medical examination without abnormal findings: Secondary | ICD-10-CM | POA: Diagnosis not present

## 2023-05-29 DIAGNOSIS — Z1339 Encounter for screening examination for other mental health and behavioral disorders: Secondary | ICD-10-CM | POA: Diagnosis not present

## 2023-05-29 DIAGNOSIS — N1831 Chronic kidney disease, stage 3a: Secondary | ICD-10-CM | POA: Diagnosis not present

## 2023-05-29 DIAGNOSIS — Z1331 Encounter for screening for depression: Secondary | ICD-10-CM | POA: Diagnosis not present

## 2023-05-31 ENCOUNTER — Other Ambulatory Visit: Payer: Self-pay | Admitting: Nurse Practitioner

## 2023-05-31 NOTE — Telephone Encounter (Signed)
 Medication refill request: Valtrex   Last AEX:  03/28/23 Next AEX: 03/28/24 Last MMG (if hormonal medication request): n/a Refill authorized: please advise

## 2023-06-12 ENCOUNTER — Other Ambulatory Visit (HOSPITAL_BASED_OUTPATIENT_CLINIC_OR_DEPARTMENT_OTHER): Payer: Self-pay

## 2023-06-12 MED ORDER — ZONISAMIDE 100 MG PO CAPS
300.0000 mg | ORAL_CAPSULE | Freq: Every day | ORAL | 0 refills | Status: DC
  Filled 2023-06-12: qty 270, 90d supply, fill #0

## 2023-06-12 MED ORDER — AIMOVIG 140 MG/ML ~~LOC~~ SOAJ
140.0000 mg | SUBCUTANEOUS | 2 refills | Status: AC
  Filled 2023-07-26 (×2): qty 1, 30d supply, fill #0

## 2023-06-13 ENCOUNTER — Other Ambulatory Visit (HOSPITAL_BASED_OUTPATIENT_CLINIC_OR_DEPARTMENT_OTHER): Payer: Self-pay

## 2023-06-13 ENCOUNTER — Other Ambulatory Visit: Payer: Self-pay

## 2023-06-27 ENCOUNTER — Other Ambulatory Visit (HOSPITAL_BASED_OUTPATIENT_CLINIC_OR_DEPARTMENT_OTHER): Payer: Self-pay

## 2023-06-27 MED ORDER — AIMOVIG 140 MG/ML ~~LOC~~ SOAJ
140.0000 mg | SUBCUTANEOUS | 2 refills | Status: DC
  Filled 2023-06-27 – 2023-08-26 (×2): qty 1, 30d supply, fill #0
  Filled 2023-09-19: qty 1, 30d supply, fill #1
  Filled 2023-10-20: qty 1, 30d supply, fill #2

## 2023-06-27 MED ORDER — SUMATRIPTAN SUCCINATE 100 MG PO TABS
100.0000 mg | ORAL_TABLET | ORAL | 2 refills | Status: AC | PRN
  Filled 2023-06-27: qty 9, 15d supply, fill #0
  Filled 2023-07-26: qty 9, 15d supply, fill #1
  Filled 2023-08-26 – 2023-10-04 (×4): qty 6, 10d supply, fill #2

## 2023-06-27 MED ORDER — ZONISAMIDE 100 MG PO CAPS
300.0000 mg | ORAL_CAPSULE | Freq: Every day | ORAL | 0 refills | Status: DC
  Filled 2023-06-27 – 2023-10-04 (×2): qty 270, 90d supply, fill #0

## 2023-06-27 MED ORDER — ZOLMITRIPTAN 5 MG NA SOLN
5.0000 mg | NASAL | 1 refills | Status: AC | PRN
  Filled 2023-06-27 – 2023-07-26 (×3): qty 6, 30d supply, fill #0

## 2023-07-05 ENCOUNTER — Other Ambulatory Visit: Payer: Self-pay | Admitting: Nurse Practitioner

## 2023-07-05 DIAGNOSIS — Z1231 Encounter for screening mammogram for malignant neoplasm of breast: Secondary | ICD-10-CM

## 2023-07-08 ENCOUNTER — Other Ambulatory Visit (HOSPITAL_BASED_OUTPATIENT_CLINIC_OR_DEPARTMENT_OTHER): Payer: Self-pay

## 2023-07-18 ENCOUNTER — Ambulatory Visit

## 2023-07-20 ENCOUNTER — Other Ambulatory Visit (HOSPITAL_BASED_OUTPATIENT_CLINIC_OR_DEPARTMENT_OTHER): Payer: Self-pay

## 2023-07-26 ENCOUNTER — Other Ambulatory Visit (HOSPITAL_BASED_OUTPATIENT_CLINIC_OR_DEPARTMENT_OTHER): Payer: Self-pay

## 2023-07-26 ENCOUNTER — Telehealth: Admitting: Family Medicine

## 2023-07-26 DIAGNOSIS — R3989 Other symptoms and signs involving the genitourinary system: Secondary | ICD-10-CM

## 2023-07-26 MED ORDER — CEPHALEXIN 500 MG PO CAPS
500.0000 mg | ORAL_CAPSULE | Freq: Two times a day (BID) | ORAL | 0 refills | Status: AC
  Filled 2023-07-26: qty 14, 7d supply, fill #0

## 2023-07-26 NOTE — Progress Notes (Signed)

## 2023-07-27 ENCOUNTER — Other Ambulatory Visit (HOSPITAL_BASED_OUTPATIENT_CLINIC_OR_DEPARTMENT_OTHER): Payer: Self-pay

## 2023-08-01 ENCOUNTER — Other Ambulatory Visit (HOSPITAL_BASED_OUTPATIENT_CLINIC_OR_DEPARTMENT_OTHER): Payer: Self-pay

## 2023-08-02 ENCOUNTER — Other Ambulatory Visit (HOSPITAL_BASED_OUTPATIENT_CLINIC_OR_DEPARTMENT_OTHER): Payer: Self-pay

## 2023-08-04 ENCOUNTER — Other Ambulatory Visit (HOSPITAL_BASED_OUTPATIENT_CLINIC_OR_DEPARTMENT_OTHER): Payer: Self-pay

## 2023-08-09 ENCOUNTER — Ambulatory Visit
Admission: RE | Admit: 2023-08-09 | Discharge: 2023-08-09 | Disposition: A | Discharge status: Home / Self Care | Payer: Self-pay | Source: Ambulatory Visit | Attending: Nurse Practitioner

## 2023-08-09 DIAGNOSIS — Z1231 Encounter for screening mammogram for malignant neoplasm of breast: Secondary | ICD-10-CM

## 2023-08-12 ENCOUNTER — Other Ambulatory Visit (HOSPITAL_BASED_OUTPATIENT_CLINIC_OR_DEPARTMENT_OTHER): Payer: Self-pay

## 2023-08-15 ENCOUNTER — Other Ambulatory Visit (HOSPITAL_BASED_OUTPATIENT_CLINIC_OR_DEPARTMENT_OTHER): Payer: Self-pay

## 2023-08-18 ENCOUNTER — Other Ambulatory Visit (HOSPITAL_BASED_OUTPATIENT_CLINIC_OR_DEPARTMENT_OTHER): Payer: Self-pay

## 2023-08-22 ENCOUNTER — Other Ambulatory Visit (HOSPITAL_BASED_OUTPATIENT_CLINIC_OR_DEPARTMENT_OTHER): Payer: Self-pay

## 2023-08-22 DIAGNOSIS — R319 Hematuria, unspecified: Secondary | ICD-10-CM | POA: Diagnosis not present

## 2023-08-22 DIAGNOSIS — N390 Urinary tract infection, site not specified: Secondary | ICD-10-CM | POA: Diagnosis not present

## 2023-08-22 DIAGNOSIS — R011 Cardiac murmur, unspecified: Secondary | ICD-10-CM | POA: Diagnosis not present

## 2023-08-22 DIAGNOSIS — R300 Dysuria: Secondary | ICD-10-CM | POA: Diagnosis not present

## 2023-08-22 MED ORDER — NITROFURANTOIN MONOHYD MACRO 100 MG PO CAPS
100.0000 mg | ORAL_CAPSULE | Freq: Two times a day (BID) | ORAL | 0 refills | Status: AC
  Filled 2023-08-22: qty 10, 5d supply, fill #0

## 2023-08-24 ENCOUNTER — Ambulatory Visit: Admitting: Obstetrics and Gynecology

## 2023-08-26 ENCOUNTER — Other Ambulatory Visit (HOSPITAL_BASED_OUTPATIENT_CLINIC_OR_DEPARTMENT_OTHER): Payer: Self-pay

## 2023-08-28 ENCOUNTER — Other Ambulatory Visit: Payer: Self-pay

## 2023-08-28 ENCOUNTER — Other Ambulatory Visit (HOSPITAL_BASED_OUTPATIENT_CLINIC_OR_DEPARTMENT_OTHER): Payer: Self-pay

## 2023-08-28 ENCOUNTER — Encounter (HOSPITAL_BASED_OUTPATIENT_CLINIC_OR_DEPARTMENT_OTHER): Payer: Self-pay

## 2023-08-29 ENCOUNTER — Other Ambulatory Visit (HOSPITAL_BASED_OUTPATIENT_CLINIC_OR_DEPARTMENT_OTHER): Payer: Self-pay

## 2023-08-29 ENCOUNTER — Other Ambulatory Visit: Payer: Self-pay

## 2023-08-29 MED FILL — Valacyclovir HCl Tab 500 MG: ORAL | 90 days supply | Qty: 90 | Fill #0 | Status: AC

## 2023-09-01 ENCOUNTER — Other Ambulatory Visit (HOSPITAL_BASED_OUTPATIENT_CLINIC_OR_DEPARTMENT_OTHER): Payer: Self-pay

## 2023-09-04 ENCOUNTER — Other Ambulatory Visit (HOSPITAL_COMMUNITY): Payer: Self-pay | Admitting: Psychiatry

## 2023-09-04 ENCOUNTER — Other Ambulatory Visit: Payer: Self-pay

## 2023-09-04 ENCOUNTER — Other Ambulatory Visit (HOSPITAL_BASED_OUTPATIENT_CLINIC_OR_DEPARTMENT_OTHER): Payer: Self-pay

## 2023-09-04 DIAGNOSIS — F419 Anxiety disorder, unspecified: Secondary | ICD-10-CM

## 2023-09-04 DIAGNOSIS — F330 Major depressive disorder, recurrent, mild: Secondary | ICD-10-CM

## 2023-09-05 ENCOUNTER — Other Ambulatory Visit (HOSPITAL_BASED_OUTPATIENT_CLINIC_OR_DEPARTMENT_OTHER): Payer: Self-pay

## 2023-09-13 ENCOUNTER — Other Ambulatory Visit (HOSPITAL_BASED_OUTPATIENT_CLINIC_OR_DEPARTMENT_OTHER): Payer: Self-pay

## 2023-09-19 ENCOUNTER — Other Ambulatory Visit (HOSPITAL_BASED_OUTPATIENT_CLINIC_OR_DEPARTMENT_OTHER): Payer: Self-pay

## 2023-09-20 ENCOUNTER — Other Ambulatory Visit: Payer: Self-pay

## 2023-09-20 ENCOUNTER — Encounter (HOSPITAL_BASED_OUTPATIENT_CLINIC_OR_DEPARTMENT_OTHER): Payer: Self-pay

## 2023-09-20 ENCOUNTER — Other Ambulatory Visit (HOSPITAL_BASED_OUTPATIENT_CLINIC_OR_DEPARTMENT_OTHER): Payer: Self-pay

## 2023-09-27 ENCOUNTER — Other Ambulatory Visit (HOSPITAL_BASED_OUTPATIENT_CLINIC_OR_DEPARTMENT_OTHER): Payer: Self-pay

## 2023-09-29 ENCOUNTER — Other Ambulatory Visit (HOSPITAL_COMMUNITY): Payer: Self-pay

## 2023-10-04 ENCOUNTER — Other Ambulatory Visit: Payer: Self-pay

## 2023-10-04 ENCOUNTER — Other Ambulatory Visit (HOSPITAL_BASED_OUTPATIENT_CLINIC_OR_DEPARTMENT_OTHER): Payer: Self-pay

## 2023-10-04 ENCOUNTER — Other Ambulatory Visit (HOSPITAL_COMMUNITY): Payer: Self-pay | Admitting: Psychiatry

## 2023-10-04 DIAGNOSIS — F330 Major depressive disorder, recurrent, mild: Secondary | ICD-10-CM

## 2023-10-04 DIAGNOSIS — F419 Anxiety disorder, unspecified: Secondary | ICD-10-CM

## 2023-10-07 ENCOUNTER — Other Ambulatory Visit (HOSPITAL_BASED_OUTPATIENT_CLINIC_OR_DEPARTMENT_OTHER): Payer: Self-pay

## 2023-10-18 ENCOUNTER — Other Ambulatory Visit (HOSPITAL_BASED_OUTPATIENT_CLINIC_OR_DEPARTMENT_OTHER): Payer: Self-pay

## 2023-10-18 ENCOUNTER — Telehealth (HOSPITAL_BASED_OUTPATIENT_CLINIC_OR_DEPARTMENT_OTHER): Admitting: Psychiatry

## 2023-10-18 ENCOUNTER — Other Ambulatory Visit: Payer: Self-pay

## 2023-10-18 ENCOUNTER — Encounter (HOSPITAL_COMMUNITY): Payer: Self-pay | Admitting: Psychiatry

## 2023-10-18 VITALS — Wt 175.0 lb

## 2023-10-18 DIAGNOSIS — F419 Anxiety disorder, unspecified: Secondary | ICD-10-CM

## 2023-10-18 DIAGNOSIS — F330 Major depressive disorder, recurrent, mild: Secondary | ICD-10-CM | POA: Diagnosis not present

## 2023-10-18 MED ORDER — ESCITALOPRAM OXALATE 20 MG PO TABS
20.0000 mg | ORAL_TABLET | Freq: Every day | ORAL | 1 refills | Status: AC
  Filled 2023-10-18: qty 90, 90d supply, fill #0
  Filled 2024-01-21: qty 90, 90d supply, fill #1

## 2023-10-18 MED ORDER — LAMOTRIGINE 100 MG PO TABS
100.0000 mg | ORAL_TABLET | Freq: Two times a day (BID) | ORAL | 1 refills | Status: AC
  Filled 2023-10-18: qty 180, 90d supply, fill #0
  Filled 2024-01-21: qty 180, 90d supply, fill #1

## 2023-10-18 MED ORDER — TRAZODONE HCL 50 MG PO TABS
50.0000 mg | ORAL_TABLET | Freq: Every day | ORAL | 1 refills | Status: AC
  Filled 2023-10-18 – 2024-02-08 (×3): qty 90, 90d supply, fill #0

## 2023-10-18 NOTE — Progress Notes (Signed)
 [facility] MD Virtual Progress Note   Patient Location: Work Provider Location: Home Office  I connect with patient by video and verified that I am speaking with correct person by using two identifiers. I discussed the limitations of evaluation and management by telemedicine and the availability of in person appointments. I also discussed with the patient that there may be a patient responsible charge related to this service. The patient expressed understanding and agreed to proceed.  [name] [id number] 48 y.o.  10/18/2023 10:06 AM  History of Present Illness:  Patient is evaluated by video session.  She reported things are going very well and she really likes her new job.  Her weekends are off.  She is able to walk outside with the dog.  She lost another 10 pounds since the last visit.  She is on Zepbound .  She report her energy level is good.  She denies any crying spells, feeling of hopelessness or worthlessness.  She denies any panic attack.  She is compliant with Lamictal , Lexapro  and trazodone  and reported no rash, itching tremors or shakes.  She noticed her headaches are not as bad.  She reported leadership is going very well and she is very happy.  She wants to continue current medication.  Past Psychiatric History: H/O depression since teens. Tried Paxil, Lunesta and briefly Wellbutrin.  Good response with Cymbalta  until stopped working.  No h/o suicidal attempt, psychiatric inpatient treatment, psychosis or self abusive behavior.      Past Medical History:  Diagnosis Date   ASCUS (atypical squamous cells of undetermined significance) on Pap smear    Depression    Kidney stones    Migraines     Outpatient Encounter Medications as of 10/18/2023  Medication Sig   AIMOVIG  140 MG/ML SOAJ Inject 140 mg into the skin every 30 (thirty) days.   AIMOVIG  140 MG/ML SOAJ Inject 140 mg into the skin every 30 (thirty) days IN THE ABDOMEN, THIGH, OR OUTER AREA  OF UPPER ARM.   Erenumab -aooe (AIMOVIG ) 140 MG/ML SOAJ Inject 140 mg into the skin every 30 (thirty) days in abdomen, thingh, or outer area of upper arm.   escitalopram  (LEXAPRO ) 20 MG tablet Take 1 tablet (20 mg total) by mouth daily.   Ferrous Sulfate (IRON) 325 (65 Fe) MG TABS 1 tablet Orally Once a day   fexofenadine (ALLEGRA) 180 MG tablet Take 180 mg by mouth daily.   lamoTRIgine  (LAMICTAL ) 100 MG tablet Take 1 tablet (100 mg total) by mouth 2 (two) times daily.   Magnesium 250 MG TABS Take 250 mg by mouth at bedtime.   nitrofurantoin , macrocrystal-monohydrate, (MACROBID ) 100 MG capsule Take 1 capsule (100 mg total) by mouth every 12 (twelve) hours with food.   Norethindrone -Ethinyl Estradiol -Fe Biphas (LO LOESTRIN FE) 1 MG-10 MCG / 10 MCG tablet 1 tablet Orally Once a day   Omega-3 Fatty Acids (FISH OIL) 1200 MG CAPS 1 capsule Orally Once a day   rosuvastatin (CRESTOR) 10 MG tablet Take 10 mg by mouth at bedtime.   SUMAtriptan  (IMITREX ) 100 MG tablet Take 100 mg by mouth every 2 (two) hours as needed for migraine or headache.   SUMAtriptan  (IMITREX ) 100 MG tablet Take 1 tablet (100 mg total) by mouth as needed. May repeat once after 2 hours.   tirzepatide  (ZEPBOUND ) 5 MG/0.5ML Pen Inject 5 mg into the skin once a week.   tirzepatide  (ZEPBOUND ) 5 MG/0.5ML Pen Inject 5 mg into the skin once a week.  traZODone  (DESYREL ) 50 MG tablet Take 1 tablet (50 mg total) by mouth at bedtime.   valACYclovir  (VALTREX ) 500 MG tablet Take 1 tablet (500 mg total) by mouth daily.   ZOMIG  5 MG nasal solution Place 1 spray into the nose daily as needed for migraine.   zolmitriptan  (ZOMIG ) 5 MG nasal solution Place 1 spray into the nose as needed. Do not combine with sumatriptan    zonisamide  (ZONEGRAN ) 100 MG capsule Take 300 mg by mouth daily.   zonisamide  (ZONEGRAN ) 100 MG capsule Take 3 capsules (300 mg total) by mouth daily.   No facility-administered encounter medications on file as of 10/18/2023.     No results found for this or any previous visit (from the past 2160 hours).   Psychiatric Specialty Exam: Physical Exam  Review of Systems  Weight 175 lb (79.4 kg).There is no height or weight on file to calculate BMI.  General Appearance: hospital scrubs  Eye Contact:  Good  Speech:  Clear and Coherent and Normal Rate  Volume:  Normal  Mood:  Euthymic  Affect:  Appropriate  Thought Process:  Goal Directed  Orientation:  Full (Time, Place, and Person)  Thought Content:  WDL  Suicidal Thoughts:  No  Homicidal Thoughts:  No  Memory:  Immediate;   Good Recent;   Good Remote;   Good  Judgement:  Good  Insight:  Present  Psychomotor Activity:  Normal  Concentration:  Concentration: Good and Attention Span: Good  Recall:  Good  Fund of Knowledge:  Good  Language:  Good  Akathisia:  No  Handed:  Right  AIMS (if indicated):     Assets:  Communication Skills Desire for Improvement Housing Resilience Social Support Talents/Skills Transportation  ADL's:  Intact  Cognition:  WNL  Sleep:  ok       03/28/2023    9:33 AM 03/30/2020    4:10 PM 06/21/2016    1:15 PM  Depression screen PHQ 2/9  Decreased Interest 1 0 1  Down, Depressed, Hopeless 1 0 1  PHQ - 2 Score 2 0 2  Altered sleeping 0  0  Tired, decreased energy 3  2  Change in appetite 0  2  Feeling bad or failure about yourself  0  1  Trouble concentrating 0  0  Moving slowly or fidgety/restless 0  1  Suicidal thoughts 0  0   PHQ-9 Score 5  8  Difficult doing work/chores Not difficult at all       Data saved with a previous flowsheet row definition    Assessment/Plan: Major depressive disorder, recurrent episode, mild - Plan: escitalopram  (LEXAPRO ) 20 MG tablet, lamoTRIgine  (LAMICTAL ) 100 MG tablet, traZODone  (DESYREL ) 50 MG tablet  Anxiety - Plan: escitalopram  (LEXAPRO ) 20 MG tablet, lamoTRIgine  (LAMICTAL ) 100 MG tablet  Patient is stable on current medication.  Since started a new job she noticed her  anxiety is much better.  She does not have to work on the weekends.  She does not want to change the medication since keeping her anxiety and depression is stable.  Continue Lexapro  20 mg daily, trazodone  50 mg at bedtime and Lamictal  100 mg twice a day.  Discussed medication side effects and benefits.  Recommend to call back if she is any question or any concern.  Follow-up in 6 months.   Follow Up Instructions:     I discussed the assessment and treatment plan with the patient. The patient was provided an opportunity to ask questions and all were  answered. The patient agreed with the plan and demonstrated an understanding of the instructions.   The patient was advised to call back or seek an in-person evaluation if the symptoms worsen or if the condition fails to improve as anticipated.    Collaboration of Care: Other provider involved in patient's care AEB notes are available in epic to review  Patient/Guardian was advised Release of Information must be obtained prior to any record release in order to collaborate their care with an outside provider. Patient/Guardian was advised if they have not already done so to contact the registration department to sign all necessary forms in order for us  to release information regarding their care.   Consent: Patient/Guardian gives verbal consent for treatment and assignment of benefits for services provided during this visit. Patient/Guardian expressed understanding and agreed to proceed.     Total encounter time 18 minutes which includes face-to-face time, chart reviewed, care coordination, order entry and documentation during this encounter.   Note: This document was prepared by [employer] voice dictation technology and any errors that results from this process are unintentional.    Leni ONEIDA Client, MD 10/18/2023

## 2023-10-20 ENCOUNTER — Other Ambulatory Visit (HOSPITAL_BASED_OUTPATIENT_CLINIC_OR_DEPARTMENT_OTHER): Payer: Self-pay

## 2023-10-23 ENCOUNTER — Other Ambulatory Visit (HOSPITAL_BASED_OUTPATIENT_CLINIC_OR_DEPARTMENT_OTHER): Payer: Self-pay

## 2023-10-25 ENCOUNTER — Other Ambulatory Visit (HOSPITAL_BASED_OUTPATIENT_CLINIC_OR_DEPARTMENT_OTHER): Payer: Self-pay

## 2023-10-26 ENCOUNTER — Other Ambulatory Visit: Payer: Self-pay

## 2023-10-26 ENCOUNTER — Other Ambulatory Visit (HOSPITAL_BASED_OUTPATIENT_CLINIC_OR_DEPARTMENT_OTHER): Payer: Self-pay

## 2023-10-26 MED ORDER — ROSUVASTATIN CALCIUM 10 MG PO TABS
10.0000 mg | ORAL_TABLET | Freq: Every day | ORAL | 3 refills | Status: AC
  Filled 2023-10-26: qty 90, 90d supply, fill #0
  Filled 2024-01-21: qty 90, 90d supply, fill #1

## 2023-10-27 ENCOUNTER — Other Ambulatory Visit (HOSPITAL_BASED_OUTPATIENT_CLINIC_OR_DEPARTMENT_OTHER): Payer: Self-pay

## 2023-10-27 MED ORDER — SUMATRIPTAN SUCCINATE 100 MG PO TABS
100.0000 mg | ORAL_TABLET | ORAL | 2 refills | Status: AC
  Filled 2023-10-27: qty 8, 15d supply, fill #0
  Filled 2023-12-15: qty 9, 30d supply, fill #1
  Filled 2024-02-08: qty 7, 20d supply, fill #2

## 2023-10-27 MED ORDER — ZOMIG 5 MG NA SOLN
1.0000 | NASAL | 1 refills | Status: AC
  Filled 2023-10-27 – 2024-02-08 (×2): qty 6, 30d supply, fill #0

## 2023-10-27 MED ORDER — ZONISAMIDE 100 MG PO CAPS
300.0000 mg | ORAL_CAPSULE | Freq: Every day | ORAL | 0 refills | Status: AC
  Filled 2023-10-27 – 2024-02-08 (×3): qty 270, 90d supply, fill #0

## 2023-10-27 MED ORDER — AIMOVIG 140 MG/ML ~~LOC~~ SOAJ
140.0000 mg | SUBCUTANEOUS | 2 refills | Status: AC
  Filled 2023-10-27 – 2023-12-15 (×2): qty 1, 30d supply, fill #0

## 2023-11-28 ENCOUNTER — Other Ambulatory Visit (HOSPITAL_BASED_OUTPATIENT_CLINIC_OR_DEPARTMENT_OTHER): Payer: Self-pay

## 2023-11-28 ENCOUNTER — Other Ambulatory Visit: Payer: Self-pay

## 2023-12-15 ENCOUNTER — Other Ambulatory Visit: Payer: Self-pay

## 2023-12-15 ENCOUNTER — Other Ambulatory Visit (HOSPITAL_BASED_OUTPATIENT_CLINIC_OR_DEPARTMENT_OTHER): Payer: Self-pay

## 2024-01-06 ENCOUNTER — Other Ambulatory Visit (HOSPITAL_COMMUNITY): Payer: Self-pay

## 2024-01-06 MED FILL — Valacyclovir HCl Tab 500 MG: ORAL | 90 days supply | Qty: 90 | Fill #1 | Status: CN

## 2024-01-07 ENCOUNTER — Other Ambulatory Visit (HOSPITAL_COMMUNITY): Payer: Self-pay

## 2024-01-08 ENCOUNTER — Other Ambulatory Visit: Payer: Self-pay

## 2024-01-08 ENCOUNTER — Encounter: Payer: Self-pay | Admitting: Pharmacist

## 2024-01-09 ENCOUNTER — Other Ambulatory Visit (HOSPITAL_BASED_OUTPATIENT_CLINIC_OR_DEPARTMENT_OTHER): Payer: Self-pay

## 2024-01-09 MED ORDER — SUMATRIPTAN SUCCINATE 100 MG PO TABS
100.0000 mg | ORAL_TABLET | ORAL | 2 refills | Status: AC | PRN
  Filled 2024-01-09: qty 9, 30d supply, fill #0
  Filled 2024-02-08: qty 9, 30d supply, fill #1

## 2024-01-09 MED ORDER — ZONISAMIDE 100 MG PO CAPS
300.0000 mg | ORAL_CAPSULE | Freq: Every day | ORAL | 0 refills | Status: AC
  Filled 2024-01-09 – 2024-02-08 (×2): qty 270, 90d supply, fill #0

## 2024-01-09 MED ORDER — AIMOVIG 140 MG/ML ~~LOC~~ SOAJ
140.0000 mg | SUBCUTANEOUS | 2 refills | Status: AC
  Filled 2024-01-09: qty 1, 30d supply, fill #0

## 2024-01-09 MED ORDER — ZOMIG 5 MG NA SOLN
1.0000 | NASAL | 1 refills | Status: AC | PRN
  Filled 2024-01-09: qty 6, 30d supply, fill #0

## 2024-01-11 ENCOUNTER — Other Ambulatory Visit: Payer: Self-pay

## 2024-01-16 ENCOUNTER — Other Ambulatory Visit (HOSPITAL_BASED_OUTPATIENT_CLINIC_OR_DEPARTMENT_OTHER): Payer: Self-pay

## 2024-01-16 ENCOUNTER — Encounter (HOSPITAL_COMMUNITY): Payer: Self-pay

## 2024-01-16 ENCOUNTER — Emergency Department (HOSPITAL_COMMUNITY)

## 2024-01-16 ENCOUNTER — Other Ambulatory Visit: Payer: Self-pay

## 2024-01-16 ENCOUNTER — Emergency Department (HOSPITAL_COMMUNITY): Admission: EM | Admit: 2024-01-16 | Discharge: 2024-01-16 | Disposition: A | Discharge status: Home / Self Care

## 2024-01-16 DIAGNOSIS — R519 Headache, unspecified: Secondary | ICD-10-CM | POA: Diagnosis not present

## 2024-01-16 DIAGNOSIS — S20219A Contusion of unspecified front wall of thorax, initial encounter: Secondary | ICD-10-CM | POA: Diagnosis not present

## 2024-01-16 DIAGNOSIS — Y92410 Unspecified street and highway as the place of occurrence of the external cause: Secondary | ICD-10-CM | POA: Insufficient documentation

## 2024-01-16 DIAGNOSIS — Z91040 Latex allergy status: Secondary | ICD-10-CM | POA: Diagnosis not present

## 2024-01-16 DIAGNOSIS — S161XXA Strain of muscle, fascia and tendon at neck level, initial encounter: Secondary | ICD-10-CM | POA: Insufficient documentation

## 2024-01-16 DIAGNOSIS — M542 Cervicalgia: Secondary | ICD-10-CM | POA: Diagnosis not present

## 2024-01-16 DIAGNOSIS — R0789 Other chest pain: Secondary | ICD-10-CM | POA: Diagnosis not present

## 2024-01-16 DIAGNOSIS — R079 Chest pain, unspecified: Secondary | ICD-10-CM | POA: Diagnosis not present

## 2024-01-16 MED ORDER — OXYCODONE-ACETAMINOPHEN 5-325 MG PO TABS
1.0000 | ORAL_TABLET | Freq: Once | ORAL | Status: AC
  Administered 2024-01-16: 1 via ORAL
  Filled 2024-01-16: qty 1

## 2024-01-16 MED ORDER — LIDOCAINE 5 % EX PTCH
1.0000 | MEDICATED_PATCH | Freq: Once | CUTANEOUS | Status: DC
  Administered 2024-01-16: 1 via TRANSDERMAL
  Filled 2024-01-16: qty 1

## 2024-01-16 MED ORDER — ACETAMINOPHEN 325 MG PO TABS
650.0000 mg | ORAL_TABLET | Freq: Once | ORAL | Status: AC
  Administered 2024-01-16: 650 mg via ORAL
  Filled 2024-01-16: qty 2

## 2024-01-16 MED ORDER — LIDOCAINE 5 % EX PTCH
1.0000 | MEDICATED_PATCH | CUTANEOUS | 0 refills | Status: AC
  Filled 2024-01-16: qty 14, 14d supply, fill #0

## 2024-01-16 MED ORDER — METHOCARBAMOL 500 MG PO TABS
500.0000 mg | ORAL_TABLET | Freq: Two times a day (BID) | ORAL | 0 refills | Status: AC
  Filled 2024-01-16: qty 20, 10d supply, fill #0
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
## 2024-01-16 NOTE — Discharge Instructions (Signed)
 Please follow-up with your primary doctor.  Return to the emergency department immediately if develop fevers, chills, severe headache, vision loss, facial droop, worsening chest pain, difficulty breathing, abdominal pain, nausea vomiting, stop having bowel movements or passing gas, severe pain or any new or worsening symptoms that are concerning to you.  You may take Tylenol  alternating with ibuprofen  with dosages as directed on the packaging.  For further pain relief you can use the lidocaine  patches and muscle relaxers we have prescribed you.

## 2024-01-24 ENCOUNTER — Other Ambulatory Visit (HOSPITAL_BASED_OUTPATIENT_CLINIC_OR_DEPARTMENT_OTHER): Payer: Self-pay

## 2024-02-08 ENCOUNTER — Other Ambulatory Visit: Payer: Self-pay

## 2024-02-08 ENCOUNTER — Other Ambulatory Visit (HOSPITAL_COMMUNITY): Payer: Self-pay

## 2024-02-09 ENCOUNTER — Other Ambulatory Visit (HOSPITAL_COMMUNITY): Payer: Self-pay

## 2024-02-11 ENCOUNTER — Other Ambulatory Visit: Payer: Self-pay

## 2024-03-28 ENCOUNTER — Ambulatory Visit: Admitting: Nurse Practitioner

## 2024-04-17 ENCOUNTER — Telehealth (HOSPITAL_COMMUNITY): Admitting: Psychiatry
